# Patient Record
Sex: Female | Born: 1965 | Race: Black or African American | Hispanic: No | Marital: Single | Smoking: Former smoker
Health system: Southern US, Community
[De-identification: ages and names within clinical notes are randomized; demographics above are authoritative.]

## PROBLEM LIST (undated history)

## (undated) DIAGNOSIS — Z9289 Personal history of other medical treatment: Secondary | ICD-10-CM

## (undated) DIAGNOSIS — I82409 Acute embolism and thrombosis of unspecified deep veins of unspecified lower extremity: Secondary | ICD-10-CM

## (undated) DIAGNOSIS — N920 Excessive and frequent menstruation with regular cycle: Secondary | ICD-10-CM

## (undated) DIAGNOSIS — M1990 Unspecified osteoarthritis, unspecified site: Secondary | ICD-10-CM

## (undated) DIAGNOSIS — G4733 Obstructive sleep apnea (adult) (pediatric): Secondary | ICD-10-CM

## (undated) DIAGNOSIS — I509 Heart failure, unspecified: Secondary | ICD-10-CM

## (undated) DIAGNOSIS — I219 Acute myocardial infarction, unspecified: Secondary | ICD-10-CM

## (undated) DIAGNOSIS — I639 Cerebral infarction, unspecified: Secondary | ICD-10-CM

## (undated) DIAGNOSIS — D649 Anemia, unspecified: Secondary | ICD-10-CM

## (undated) HISTORY — PX: CARPAL TUNNEL RELEASE: SHX101

## (undated) HISTORY — DX: Obstructive sleep apnea (adult) (pediatric): G47.33

## (undated) HISTORY — PX: TONSILLECTOMY: SUR1361

## (undated) HISTORY — DX: Cerebral infarction, unspecified: I63.9

---

## 2001-04-24 DIAGNOSIS — G4733 Obstructive sleep apnea (adult) (pediatric): Secondary | ICD-10-CM | POA: Insufficient documentation

## 2001-04-24 HISTORY — DX: Obstructive sleep apnea (adult) (pediatric): G47.33

## 2002-04-24 HISTORY — PX: CHOLECYSTECTOMY: SHX55

## 2002-04-24 HISTORY — PX: GASTRIC BYPASS: SHX52

## 2010-03-09 ENCOUNTER — Emergency Department (HOSPITAL_BASED_OUTPATIENT_CLINIC_OR_DEPARTMENT_OTHER)
Admission: EM | Admit: 2010-03-09 | Discharge: 2010-03-09 | Payer: Self-pay | Source: Home / Self Care | Admitting: Emergency Medicine

## 2010-03-09 ENCOUNTER — Ambulatory Visit: Payer: Self-pay | Admitting: Diagnostic Radiology

## 2010-07-05 LAB — URINE MICROSCOPIC-ADD ON

## 2010-07-05 LAB — DIFFERENTIAL
Basophils Absolute: 0 10*3/uL (ref 0.0–0.1)
Eosinophils Relative: 0 % (ref 0–5)
Monocytes Absolute: 0.4 10*3/uL (ref 0.1–1.0)
Monocytes Relative: 6 % (ref 3–12)
Neutrophils Relative %: 84 % — ABNORMAL HIGH (ref 43–77)

## 2010-07-05 LAB — URINE CULTURE: Culture  Setup Time: 201111170044

## 2010-07-05 LAB — CBC
HCT: 19.5 % — ABNORMAL LOW (ref 36.0–46.0)
MCH: 18.4 pg — ABNORMAL LOW (ref 26.0–34.0)
MCHC: 30.4 g/dL (ref 30.0–36.0)
MCV: 60.5 fL — ABNORMAL LOW (ref 78.0–100.0)
RDW: 20.2 % — ABNORMAL HIGH (ref 11.5–15.5)

## 2010-07-05 LAB — URINALYSIS, ROUTINE W REFLEX MICROSCOPIC
Glucose, UA: NEGATIVE mg/dL
Nitrite: POSITIVE — AB
Protein, ur: NEGATIVE mg/dL
Urobilinogen, UA: 4 mg/dL — ABNORMAL HIGH (ref 0.0–1.0)

## 2010-07-05 LAB — COMPREHENSIVE METABOLIC PANEL
Alkaline Phosphatase: 77 U/L (ref 39–117)
BUN: 6 mg/dL (ref 6–23)
CO2: 24 mEq/L (ref 19–32)
Calcium: 8.4 mg/dL (ref 8.4–10.5)
GFR calc non Af Amer: >60 mL/min (ref >60)
Glucose, Bld: 81 mg/dL (ref 70–99)
Total Protein: 7.3 g/dL (ref 6.0–8.3)

## 2010-07-05 LAB — HEMOCCULT GUIAC POC 1CARD (OFFICE): Fecal Occult Bld: NEGATIVE

## 2010-07-05 LAB — PREGNANCY, URINE: Preg Test, Ur: NEGATIVE

## 2010-07-05 LAB — LIPASE, BLOOD: Lipase: 50 U/L (ref 23–300)

## 2015-07-30 ENCOUNTER — Encounter (HOSPITAL_COMMUNITY): Payer: Self-pay | Admitting: *Deleted

## 2015-07-30 ENCOUNTER — Emergency Department (HOSPITAL_COMMUNITY): Payer: PRIVATE HEALTH INSURANCE

## 2015-07-30 ENCOUNTER — Inpatient Hospital Stay (HOSPITAL_COMMUNITY)
Admission: EM | Admit: 2015-07-30 | Discharge: 2015-08-01 | DRG: 812 | Disposition: A | Discharge status: Home / Self Care | Payer: PRIVATE HEALTH INSURANCE | Attending: Internal Medicine | Admitting: Internal Medicine

## 2015-07-30 DIAGNOSIS — Z6841 Body Mass Index (BMI) 40.0 and over, adult: Secondary | ICD-10-CM

## 2015-07-30 DIAGNOSIS — I509 Heart failure, unspecified: Secondary | ICD-10-CM

## 2015-07-30 DIAGNOSIS — E538 Deficiency of other specified B group vitamins: Secondary | ICD-10-CM | POA: Diagnosis present

## 2015-07-30 DIAGNOSIS — Z86718 Personal history of other venous thrombosis and embolism: Secondary | ICD-10-CM

## 2015-07-30 DIAGNOSIS — D500 Iron deficiency anemia secondary to blood loss (chronic): Secondary | ICD-10-CM | POA: Diagnosis not present

## 2015-07-30 DIAGNOSIS — R0902 Hypoxemia: Secondary | ICD-10-CM | POA: Diagnosis not present

## 2015-07-30 DIAGNOSIS — R0600 Dyspnea, unspecified: Secondary | ICD-10-CM | POA: Diagnosis present

## 2015-07-30 DIAGNOSIS — I5033 Acute on chronic diastolic (congestive) heart failure: Secondary | ICD-10-CM | POA: Diagnosis present

## 2015-07-30 DIAGNOSIS — R0602 Shortness of breath: Secondary | ICD-10-CM | POA: Diagnosis not present

## 2015-07-30 DIAGNOSIS — R600 Localized edema: Secondary | ICD-10-CM

## 2015-07-30 DIAGNOSIS — N920 Excessive and frequent menstruation with regular cycle: Secondary | ICD-10-CM

## 2015-07-30 DIAGNOSIS — I5032 Chronic diastolic (congestive) heart failure: Secondary | ICD-10-CM | POA: Diagnosis present

## 2015-07-30 DIAGNOSIS — Z9884 Bariatric surgery status: Secondary | ICD-10-CM

## 2015-07-30 HISTORY — DX: Excessive and frequent menstruation with regular cycle: N92.0

## 2015-07-30 HISTORY — DX: Acute myocardial infarction, unspecified: I21.9

## 2015-07-30 HISTORY — DX: Anemia, unspecified: D64.9

## 2015-07-30 HISTORY — DX: Acute embolism and thrombosis of unspecified deep veins of unspecified lower extremity: I82.409

## 2015-07-30 HISTORY — DX: Personal history of other medical treatment: Z92.89

## 2015-07-30 HISTORY — DX: Unspecified osteoarthritis, unspecified site: M19.90

## 2015-07-30 HISTORY — DX: Heart failure, unspecified: I50.9

## 2015-07-30 LAB — BASIC METABOLIC PANEL
ANION GAP: 8 (ref 5–15)
BUN: 10 mg/dL (ref 6–20)
CO2: 26 mmol/L (ref 22–32)
Calcium: 8.8 mg/dL — ABNORMAL LOW (ref 8.9–10.3)
Chloride: 106 mmol/L (ref 101–111)
Creatinine, Ser: 0.58 mg/dL (ref 0.44–1.00)
GFR calc Af Amer: >60 mL/min (ref >60)
GLUCOSE: 110 mg/dL — AB (ref 65–99)
POTASSIUM: 4.6 mmol/L (ref 3.5–5.1)
Sodium: 140 mmol/L (ref 135–145)

## 2015-07-30 LAB — CBC
HEMATOCRIT: 28 % — AB (ref 36.0–46.0)
HEMOGLOBIN: 7.5 g/dL — AB (ref 12.0–15.0)
MCH: 19.7 pg — AB (ref 26.0–34.0)
MCHC: 26.8 g/dL — AB (ref 30.0–36.0)
MCV: 73.7 fL — AB (ref 78.0–100.0)
Platelets: 210 10*3/uL (ref 150–400)
RBC: 3.8 MIL/uL — ABNORMAL LOW (ref 3.87–5.11)
RDW: 18.1 % — AB (ref 11.5–15.5)
WBC: 4.7 10*3/uL (ref 4.0–10.5)

## 2015-07-30 LAB — BRAIN NATRIURETIC PEPTIDE: B Natriuretic Peptide: 43.8 pg/mL (ref 0.0–100.0)

## 2015-07-30 LAB — I-STAT TROPONIN, ED: Troponin i, poc: 0 ng/mL (ref 0.00–0.08)

## 2015-07-30 MED ORDER — FUROSEMIDE 10 MG/ML IJ SOLN
40.0000 mg | Freq: Once | INTRAMUSCULAR | Status: AC
Start: 2015-07-30 — End: 2015-07-31
  Administered 2015-07-31: 40 mg via INTRAVENOUS
  Filled 2015-07-30: qty 4

## 2015-07-30 NOTE — ED Notes (Signed)
Pt was placed on O2 [place]

## 2015-07-30 NOTE — ED Notes (Signed)
Pt states chest pain and sob x 1 week.  Noted bil LE swelling x 2 days.

## 2015-07-30 NOTE — ED Notes (Signed)
Pt ambulated to restroom with [facility] EMT, once pt returned O2 sats 79-84% placed on 2L [place], O2 100

## 2015-07-30 NOTE — H&P (Signed)
PCP:  No primary care provider on file.    Referring provider Erskine Emery Resident   Chief Complaint: Chest pain shortness of breath  HPI: Dana Abbott is a 50 y.o. female   has a past medical history of CHF (congestive heart failure) (HCC) and Anemia. DVT  Presented with one-week history of chest pain and shortness of breath associated with orthopnea and dyspnea on exertion. Have been having lower extremity edema for the past 2 days. She adheres to strict diet but still has significant swelling. Her clothes does not fit. Her chest pain started over 1 week ago hurting on the right worse with deep breaths.  She denies any melena or bright red blood per rectum, she endorses heavy vaginal bleeding twice a month She was supposed to have partial hysterectomy but her Ob GYN has retired. She has been taking iron pills  But not on the regular basis.   IN ER: Noted to be hypoxic down to 88% and was started on 2 L of nasal cannula BNP appears to be normal 43 chest x-ray was read as no cardiopulmonary edema although per my evaluation possibly some vascular congestion present. Difficult to evaluate study given habitus. Was noted to have a hemoglobin down to 7.5 MCV 73.7 troponin 0.0   Regarding pertinent past history: Patient states that in the past she used to be diagnosis of diastolic heart failure Echogram from August 2016 showing mild concentric left ventricular hypertrophy normal EF 60-65 percent. At that time she has undergone nuclear medicine myocardial perfusion scan that showed no reversible ischemia or infarction normal left ventricular wall motion ejection fraction is also 60% this studies were done [facility] healthcare Patient states that from her heart failure standpoint she's been doing so well that she was taken off all her medications. Regarding patient's history of anemia and August 2016 hemoglobin was down to 8.3 she has required transfusions in the past. As well as intervenous iron.    Hospitalist was called for admission for diastolic heart failure exacerbation  Review of Systems:    Pertinent positives include:  chest pain, Orthopnea, PND, anasarca,  Bilateral lower extremity s  Constitutional:  No weight loss, night sweats, Fevers, chills, fatigue, weight loss  HEENT:  No headaches, Difficulty swallowing,Tooth/dental problems,Sore throat,  No sneezing, itching, ear ache, nasal congestion, post nasal drip,  Cardio-vascular:  No  dizziness, palpitations.nowelling  GI:  No heartburn, indigestion, abdominal pain, nausea, vomiting, diarrhea, change in bowel habits, loss of appetite, melena, blood in stool, hematemesis Resp:  no shortness of breath at rest. No dyspnea on exertion, No excess mucus, no productive cough, No non-productive cough, No coughing up of blood.No change in color of mucus.No wheezing. Skin:  no rash or lesions. No jaundice GU:  no dysuria, change in color of urine, no urgency or frequency. No straining to urinate.  No flank pain.  Musculoskeletal:  No joint pain or no joint swelling. No decreased range of motion. No back pain.  Psych:  No change in mood or affect. No depression or anxiety. No memory loss.  Neuro: no localizing neurological complaints, no tingling, no weakness, no double vision, no gait abnormality, no slurred speech, no confusion  Otherwise ROS are negative except for above, 10 systems were reviewed  Past Medical History: Past Medical History  Diagnosis Date  . CHF (congestive heart failure) (HCC)   . Anemia    Past Surgical History  Procedure Laterality Date  . Cesarean section    . Gastric bypass  Medications: Prior to Admission medications   Medication Sig Start Date End Date Taking? Authorizing Provider  acetaminophen (TYLENOL) 500 MG tablet Take 500 mg by mouth every 6 (six) hours as needed for mild pain.   Yes Historical Provider, MD    Allergies:  No Known Allergies  Social History:  Ambulatory    independently   Lives at home  With family     reports that she has never smoked. She does not have any smokeless tobacco history on file. She reports that she does not drink alcohol or use illicit drugs.     Family History: family history includes COPD in her father; Diabetes type II in her daughter; Hypertension in her mother; Stroke in her mother. There is no history of Cancer.    Physical Exam: Patient Vitals for the past 24 hrs:  BP Temp Temp src Pulse Resp SpO2 Height Weight  07/30/15 2300 121/74 mmHg - - 74 22 99 % - -  07/30/15 2230 120/80 mmHg - - 78 23 99 % - -  07/30/15 2200 141/85 mmHg - - 77 25 100 % - -  07/30/15 2130 135/82 mmHg - - 72 19 100 % - -  07/30/15 2114 - - - - - 100 % - -  07/30/15 2112 127/74 mmHg - - 97 18 (!) 88 % - -  07/30/15 2000 107/68 mmHg - - 80 22 100 % - -  07/30/15 1930 107/78 mmHg - - 102 21 99 % - -  07/30/15 1915 - - - - - -  (1.448 m) (!) 166.924 kg (368 lb)  07/30/15 1906 127/75 mmHg 98.4 F (36.9 C) Oral 100 18 97 % - -    1. General:  in No Acute distress 2. Psychological: Alert and  Oriented 3. Head/ENT:   Moist  Mucous Membranes                          Head Non traumatic, neck supple                          Normal   Dentition 4. SKIN: normal  Skin turgor,  Skin clean Dry and intact no rash 5. Heart: Regular rate and rhythm no Murmur, Rub or gallop 6. Lungs:  no wheezes Some mild crackles  distant 7. Abdomen: Soft, non-tender, Non distended obese 8. Lower extremities: no clubbing, cyanosis, trace edema, obese 9. Neurologically Grossly intact, moving all 4 extremities equally 10. MSK: Normal range of motion  body mass index is 79.61 kg/(m^2).   Labs on Admission:   Results for orders placed or performed during the hospital encounter of 07/30/15 (from the past 24 hour(s))  Basic metabolic panel     Status: Abnormal   Collection Time: 07/30/15  7:04 PM  Result Value Ref Range   Sodium 140 135 - 145 mmol/L   Potassium  4.6 3.5 - 5.1 mmol/L   Chloride 106 101 - 111 mmol/L   CO2 26 22 - 32 mmol/L   Glucose, Bld 110 (H) 65 - 99 mg/dL   BUN 10 6 - 20 mg/dL   Creatinine, Ser 4.09 0.44 - 1.00 mg/dL   Calcium 8.8 (L) 8.9 - 10.3 mg/dL   GFR calc non Af Amer >60 >60 mL/min   GFR calc Af Amer >60 >60 mL/min   Anion gap 8 5 - 15  CBC     Status: Abnormal  Collection Time: 07/30/15  7:04 PM  Result Value Ref Range   WBC 4.7 4.0 - 10.5 K/uL   RBC 3.80 (L) 3.87 - 5.11 MIL/uL   Hemoglobin 7.5 (L) 12.0 - 15.0 g/dL   HCT 82.6 (L) 41.5 - 83.0 %   MCV 73.7 (L) 78.0 - 100.0 fL   MCH 19.7 (L) 26.0 - 34.0 pg   MCHC 26.8 (L) 30.0 - 36.0 g/dL   RDW 94.0 (H) 76.8 - 08.8 %   Platelets 210 150 - 400 K/uL  Brain natriuretic peptide     Status: None   Collection Time: 07/30/15  7:04 PM  Result Value Ref Range   B Natriuretic Peptide 43.8 0.0 - 100.0 pg/mL  I-stat troponin, ED     Status: None   Collection Time: 07/30/15  7:19 PM  Result Value Ref Range   Troponin i, poc 0.00 0.00 - 0.08 ng/mL   Comment 3            UA Not obtained  No results found for: HGBA1C  Estimated Creatinine Clearance: 123.4 mL/min (by C-G formula based on Cr of 0.58).  BNP (last 3 results) No results for input(s): PROBNP in the last 8760 hours.  Other results:  I have pearsonaly reviewed this: ECG REPORT  Rate: 102  Rhythm: Sinus tachycardia ST&T Change: T wave inversions in multiple leads QTC 466  Filed Weights   07/30/15 1915  Weight: 166.924 kg (368 lb)     Cultures:    Component Value Date/Time   SDES URINE, RANDOM 03/09/2010 1408   SPECREQUEST NONE 03/09/2010 1408   CULT ESCHERICHIA COLI 03/09/2010 1408   REPTSTATUS 03/11/2010 FINAL 03/09/2010 1408     Radiological Exams on Admission: Dg Chest 2 View  07/30/2015  CLINICAL DATA:  Pt c/o right sided chest tightness increasing with movement, SOB and dry cough x a couple of weeks and worsening. Pt currently on 2 liters of oxygen. Hx CHF. EXAM: CHEST  2 VIEW  COMPARISON:  12/07/2014 FINDINGS: Mild enlargement of the cardiopericardial silhouette. No mediastinal or hilar masses or evidence of adenopathy. Lungs are clear.  No pleural effusion or pneumothorax. Bony thorax is intact. IMPRESSION: 1. No acute cardiopulmonary disease. 2. Stable cardiomegaly. Electronically Signed   By: Amie Portland M.D.   On: 07/30/2015 20:32    Chart has been reviewed  Family not at  Bedside    Assessment/Plan  50 year old female with history of diastolic heart failure presents with dyspnea and peripheral edema worrisome for possible diastolic heart failure exacerbation vs symptomatic anemia due to dysfunctional uterine bleeding.   Present on Admission:  . Acute on chronic diastolic heart failure (HCC) -   BNP  Wnl,  no evidence of pulmonary edema but patient endorses, increased fluid . Will obtain echo,  Cycle cardiac markers if no evidence of PE would diurese.  Marland Kitchen Hypoxia - given prior hx of DVT will obtain CT angio, given CXR not consistent with significant fluid overload to explain hypoxia and evidence of chest pain.  . Blood loss anemia - most likely due to heavy menses. Will transfuse 1 unit given that patient is very symptomatic and likely hemodiluted. Obtain pelvic US. She will need  To establish follow up with OBgYN . Dyspnea - multifactorial will obtain CT angio to eval for PE, cycle CE, obtain echo, if no evince of PE would diurese.   obtain doppler of LE given leg swelling and prior hx of DVT.   Prophylaxis: SCD  CODE STATUS:  FULL CODE as per patient   Disposition:  To home once workup is complete and patient is stable  Other plan as per orders.  I have spent a total of 56 min on this admission    Dana Abbott 07/31/2015, 12:13 AM    [facility]  Pager [phone]   after 2 AM please page floor coverage PA If 7AM-7PM, please contact the day team taking care of the patient  [email or web address]  Password [email or web address]

## 2015-07-30 NOTE — ED Provider Notes (Signed)
CSN: [id number]     Arrival date & time 07/30/15  1856 History   First MD Initiated Contact with Patient 07/30/15 1929     Chief Complaint  Patient presents with  . Chest Pain  . Shortness of Breath     (Consider location/radiation/quality/duration/timing/severity/associated sxs/prior Treatment) HPI  This patient is a 50 year old female who is a history of nonischemic cardiomyopathy, was diagnosed with diastolic heart failure in the past, but has not been on any therapy recently. She presents today complaining of shortness of breath for the past several weeks. She says that she has been gaining weight, and her legs have been swelling, to the point she has been unable to put on her socks, and her pants are fitting tightly. She says that she has been having worsening dyspnea on exertion, and orthopnea now requiring her to sleep propped up on several pillows. She has never had symptoms like this in the past. Eyes any productive cough, she has had no hemoptysis. She is not on exogenous estrogen, and is a nonsmoker.  She has been reporting intermittent chest pain that she describes as heaviness.  She says it occurs with exertion, last minutes to hours, and resolved spontaneously. Nothing seems to make it better or worse. She has had no fevers or chills.   Past Medical History  Diagnosis Date  . CHF (congestive heart failure) (HCC)   . Anemia    Past Surgical History  Procedure Laterality Date  . Cesarean section    . Gastric bypass     History reviewed. No pertinent family history. Social History  Substance Use Topics  . Smoking status: Never Smoker   . Smokeless tobacco: None  . Alcohol Use: No   OB History    No data available     Review of Systems  Constitutional: Positive for fatigue. Negative for fever.  Respiratory: Positive for cough and shortness of breath.   Cardiovascular: Positive for chest pain, palpitations and leg swelling.  Gastrointestinal: Positive for abdominal  distention. Negative for nausea, abdominal pain, diarrhea, blood in stool and anal bleeding.  Genitourinary: Negative for dysuria, flank pain and enuresis.  Musculoskeletal: Negative for back pain and gait problem.  All other systems reviewed and are negative.     Allergies  Review of patient's allergies indicates no known allergies.  Home Medications   Prior to Admission medications   Medication Sig Start Date End Date Taking? Authorizing Provider  acetaminophen (TYLENOL) 500 MG tablet Take 500 mg by mouth every 6 (six) hours as needed for mild pain.   Yes Historical Provider, MD   BP 121/74 mmHg  Pulse 74  Temp(Src) 98.4 F (36.9 C) (Oral)  Resp 22  Ht 4\' 9"  (1.448 m)  Wt 166.924 kg  BMI 79.61 kg/m2  SpO2 99%  LMP 07/11/2015 (Approximate) Physical Exam  Constitutional: She is oriented to person, place, and time. She appears well-developed and well-nourished. No distress.  HENT:  Head: Normocephalic and atraumatic.  Eyes: Pupils are equal, round, and reactive to light.  Neck: JVD present.  Cardiovascular: Normal rate and regular rhythm.   No murmur heard. Pulmonary/Chest: Effort normal. No respiratory distress ( 3+ pitting edema to the knees bilaterally). She has rales.  Abdominal: Soft. Bowel sounds are normal. She exhibits no distension. There is no tenderness.  Musculoskeletal: Normal range of motion. She exhibits edema.  Neurological: She is alert and oriented to person, place, and time.  Skin: Skin is warm and dry.  Nursing note and vitals  reviewed.   ED Course  Procedures (including critical care time) Labs Review Labs Reviewed  BASIC METABOLIC PANEL - Abnormal; Notable for the following:    Glucose, Bld 110 (*)    Calcium 8.8 (*)    All other components within normal limits  CBC - Abnormal; Notable for the following:    RBC 3.80 (*)    Hemoglobin 7.5 (*)    HCT 28.0 (*)    MCV 73.7 (*)    MCH 19.7 (*)    MCHC 26.8 (*)    RDW 18.1 (*)    All other  components within normal limits  BRAIN NATRIURETIC PEPTIDE  VITAMIN B12  FOLATE  IRON AND TIBC  FERRITIN  RETICULOCYTES  I-STAT TROPOININ, ED    Imaging Review Dg Chest 2 View  07/30/2015  CLINICAL DATA:  Pt c/o right sided chest tightness increasing with movement, SOB and dry cough x a couple of weeks and worsening. Pt currently on 2 liters of oxygen. Hx CHF. EXAM: CHEST  2 VIEW COMPARISON:  12/07/2014 FINDINGS: Mild enlargement of the cardiopericardial silhouette. No mediastinal or hilar masses or evidence of adenopathy. Lungs are clear.  No pleural effusion or pneumothorax. Bony thorax is intact. IMPRESSION: 1. No acute cardiopulmonary disease. 2. Stable cardiomegaly. Electronically Signed   By: Amie Portland M.D.   On: 07/30/2015 20:32   I have personally reviewed and evaluated these images and lab results as part of my medical decision-making.   EKG Interpretation   Date/Time:  Friday July 30 2015 19:04:07 EDT Ventricular Rate:  102 PR Interval:  150 QRS Duration: 84 QT Interval:  358 QTC Calculation: 466 R Axis:   82 Text Interpretation:  Sinus tachycardia Nonspecific T wave abnormality  Abnormal ECG agree. no old comparison Confirmed by Donnald Garre, MD, Lebron Conners  [phone]) on 07/30/2015 7:11:30 PM      MDM   Final diagnoses:  None     The patient presents complaining of shortness of breath, chest pain, and leg swelling. She appears grossly volume overloaded on exam, and her history is consistent with heart failure. She has mild bibasilar pulmonary edema on  Chest x-ray, combined with JVD, and bilateral lower extremity edema. She has a history of heart failure, but has not been on therapy for quite some time. Labs obtained, notable for a normal BNP, but anemia of 7.5. She has a history of anemia in the past, and denies any symptoms of GI bleeding, though does have heavy vaginal leading associated with her periods, which may be the etiology. Do not believe she needs blood transfusion  at this time, but will send a type and screen and an anemia panel.  Will  Give Lasix, as she appears volume overloaded, also will check d-dimer at the request of the hospitalist, admit to hospitalist for diuresis and further workup.    Erskine Emery, MD 07/31/15 6045  Lavera Guise, MD 07/31/15 [phone]

## 2015-07-31 ENCOUNTER — Observation Stay (HOSPITAL_COMMUNITY): Payer: PRIVATE HEALTH INSURANCE

## 2015-07-31 ENCOUNTER — Encounter (HOSPITAL_COMMUNITY): Payer: Self-pay | Admitting: Internal Medicine

## 2015-07-31 DIAGNOSIS — Z9884 Bariatric surgery status: Secondary | ICD-10-CM | POA: Diagnosis not present

## 2015-07-31 DIAGNOSIS — I5032 Chronic diastolic (congestive) heart failure: Secondary | ICD-10-CM | POA: Diagnosis present

## 2015-07-31 DIAGNOSIS — E538 Deficiency of other specified B group vitamins: Secondary | ICD-10-CM | POA: Diagnosis present

## 2015-07-31 DIAGNOSIS — R0902 Hypoxemia: Secondary | ICD-10-CM | POA: Diagnosis present

## 2015-07-31 DIAGNOSIS — R0600 Dyspnea, unspecified: Secondary | ICD-10-CM

## 2015-07-31 DIAGNOSIS — Z6841 Body Mass Index (BMI) 40.0 and over, adult: Secondary | ICD-10-CM | POA: Diagnosis not present

## 2015-07-31 DIAGNOSIS — D500 Iron deficiency anemia secondary to blood loss (chronic): Secondary | ICD-10-CM | POA: Diagnosis present

## 2015-07-31 DIAGNOSIS — Z86718 Personal history of other venous thrombosis and embolism: Secondary | ICD-10-CM | POA: Diagnosis not present

## 2015-07-31 DIAGNOSIS — R0602 Shortness of breath: Secondary | ICD-10-CM | POA: Diagnosis present

## 2015-07-31 DIAGNOSIS — I5033 Acute on chronic diastolic (congestive) heart failure: Secondary | ICD-10-CM | POA: Diagnosis not present

## 2015-07-31 LAB — COMPREHENSIVE METABOLIC PANEL
ALT: 16 U/L (ref 14–54)
AST: 18 U/L (ref 15–41)
Albumin: 3 g/dL — ABNORMAL LOW (ref 3.5–5.0)
Alkaline Phosphatase: 57 U/L (ref 38–126)
Anion gap: 9 (ref 5–15)
BILIRUBIN TOTAL: 0.6 mg/dL (ref 0.3–1.2)
BUN: 11 mg/dL (ref 6–20)
CHLORIDE: 104 mmol/L (ref 101–111)
CO2: 26 mmol/L (ref 22–32)
CREATININE: 0.48 mg/dL (ref 0.44–1.00)
Calcium: 8.4 mg/dL — ABNORMAL LOW (ref 8.9–10.3)
GFR calc Af Amer: >60 mL/min (ref >60)
Glucose, Bld: 92 mg/dL (ref 65–99)
Potassium: 4.1 mmol/L (ref 3.5–5.1)
Sodium: 139 mmol/L (ref 135–145)
TOTAL PROTEIN: 6.5 g/dL (ref 6.5–8.1)

## 2015-07-31 LAB — RETICULOCYTES
RBC.: 3.76 MIL/uL — ABNORMAL LOW (ref 3.87–5.11)
RETIC CT PCT: 1.2 % (ref 0.4–3.1)
Retic Count, Absolute: 45.1 10*3/uL (ref 19.0–186.0)

## 2015-07-31 LAB — URINALYSIS, ROUTINE W REFLEX MICROSCOPIC
Bilirubin Urine: NEGATIVE
GLUCOSE, UA: NEGATIVE mg/dL
KETONES UR: NEGATIVE mg/dL
NITRITE: NEGATIVE
PROTEIN: NEGATIVE mg/dL
Specific Gravity, Urine: 1.007 (ref 1.005–1.030)
pH: 6 (ref 5.0–8.0)

## 2015-07-31 LAB — HEPATIC FUNCTION PANEL
ALBUMIN: 3.4 g/dL — AB (ref 3.5–5.0)
ALK PHOS: 65 U/L (ref 38–126)
ALT: 20 U/L (ref 14–54)
AST: 21 U/L (ref 15–41)
BILIRUBIN TOTAL: 0.7 mg/dL (ref 0.3–1.2)
Bilirubin, Direct: <0.1 mg/dL — ABNORMAL LOW (ref 0.1–0.5)
Total Protein: 7.4 g/dL (ref 6.5–8.1)

## 2015-07-31 LAB — PHOSPHORUS: Phosphorus: 3.5 mg/dL (ref 2.5–4.6)

## 2015-07-31 LAB — CBC
HCT: 24.2 % — ABNORMAL LOW (ref 36.0–46.0)
HCT: 30 % — ABNORMAL LOW (ref 36.0–46.0)
Hemoglobin: 6.4 g/dL — CL (ref 12.0–15.0)
Hemoglobin: 8.3 g/dL — ABNORMAL LOW (ref 12.0–15.0)
MCH: 19.3 pg — ABNORMAL LOW (ref 26.0–34.0)
MCH: 20.4 pg — ABNORMAL LOW (ref 26.0–34.0)
MCHC: 26.4 g/dL — ABNORMAL LOW (ref 30.0–36.0)
MCHC: 27.7 g/dL — ABNORMAL LOW (ref 30.0–36.0)
MCV: 72.9 fL — AB (ref 78.0–100.0)
MCV: 73.9 fL — ABNORMAL LOW (ref 78.0–100.0)
PLATELETS: 173 10*3/uL (ref 150–400)
PLATELETS: ADEQUATE 10*3/uL (ref 150–400)
RBC: 3.32 MIL/uL — ABNORMAL LOW (ref 3.87–5.11)
RBC: 4.06 MIL/uL (ref 3.87–5.11)
RDW: 18.2 % — AB (ref 11.5–15.5)
RDW: 17.6 % — AB (ref 11.5–15.5)
WBC: 3.8 10*3/uL — AB (ref 4.0–10.5)
WBC: 5.9 10*3/uL (ref 4.0–10.5)

## 2015-07-31 LAB — URINE MICROSCOPIC-ADD ON

## 2015-07-31 LAB — VITAMIN B12: Vitamin B-12: 173 pg/mL — ABNORMAL LOW (ref 180–914)

## 2015-07-31 LAB — PREPARE RBC (CROSSMATCH)

## 2015-07-31 LAB — BRAIN NATRIURETIC PEPTIDE: B Natriuretic Peptide: 39.2 pg/mL (ref 0.0–100.0)

## 2015-07-31 LAB — D-DIMER, QUANTITATIVE: D-Dimer, Quant: 0.75 ug/mL-FEU — ABNORMAL HIGH (ref 0.00–0.50)

## 2015-07-31 LAB — FERRITIN: Ferritin: 4 ng/mL — ABNORMAL LOW (ref 11–307)

## 2015-07-31 LAB — IRON AND TIBC
IRON: 15 ug/dL — AB (ref 28–170)
SATURATION RATIOS: 3 % — AB (ref 10.4–31.8)
TIBC: 560 ug/dL — AB (ref 250–450)
UIBC: 545 ug/dL

## 2015-07-31 LAB — MAGNESIUM: Magnesium: 1.7 mg/dL (ref 1.7–2.4)

## 2015-07-31 LAB — FOLATE: Folate: 11.3 ng/mL (ref >5.9)

## 2015-07-31 LAB — TROPONIN I
TROPONIN I: 0.04 ng/mL — AB (ref <0.031)
Troponin I: <0.03 ng/mL (ref <0.031)
Troponin I: <0.03 ng/mL (ref <0.031)

## 2015-07-31 LAB — ABO/RH: ABO/RH(D): AB POS

## 2015-07-31 LAB — PROTIME-INR
INR: 1.07 (ref 0.00–1.49)
PROTHROMBIN TIME: 14.1 s (ref 11.6–15.2)

## 2015-07-31 LAB — TSH: TSH: 1.989 u[IU]/mL (ref 0.350–4.500)

## 2015-07-31 MED ORDER — ACETAMINOPHEN 325 MG PO TABS
650.0000 mg | ORAL_TABLET | Freq: Once | ORAL | Status: AC
  Administered 2015-07-31: 650 mg via ORAL
  Filled 2015-07-31: qty 2

## 2015-07-31 MED ORDER — HYDROCODONE-ACETAMINOPHEN 5-325 MG PO TABS: 1.0000 | ORAL_TABLET | ORAL | Status: DC | PRN

## 2015-07-31 MED ORDER — ACETAMINOPHEN 500 MG PO TABS: 500.0000 mg | ORAL_TABLET | Freq: Four times a day (QID) | ORAL | Status: DC | PRN

## 2015-07-31 MED ORDER — FUROSEMIDE 10 MG/ML IJ SOLN
40.0000 mg | Freq: Every day | INTRAMUSCULAR | Status: DC
  Administered 2015-08-01: 40 mg via INTRAVENOUS
  Filled 2015-07-31: qty 4

## 2015-07-31 MED ORDER — ENOXAPARIN SODIUM 80 MG/0.8ML ~~LOC~~ SOLN
80.0000 mg | SUBCUTANEOUS | Status: DC
  Administered 2015-07-31: 80 mg via SUBCUTANEOUS
  Filled 2015-07-31 (×2): qty 0.8

## 2015-07-31 MED ORDER — SODIUM CHLORIDE 0.9% FLUSH
3.0000 mL | Freq: Two times a day (BID) | INTRAVENOUS | Status: DC
  Administered 2015-07-31 – 2015-08-01 (×4): 3 mL via INTRAVENOUS

## 2015-07-31 MED ORDER — ACETAMINOPHEN 650 MG RE SUPP: 650.0000 mg | Freq: Four times a day (QID) | RECTAL | Status: DC | PRN

## 2015-07-31 MED ORDER — DIPHENHYDRAMINE HCL 25 MG PO CAPS
25.0000 mg | ORAL_CAPSULE | Freq: Once | ORAL | Status: AC
  Administered 2015-07-31: 25 mg via ORAL
  Filled 2015-07-31: qty 1

## 2015-07-31 MED ORDER — CYANOCOBALAMIN 1000 MCG/ML IJ SOLN
1000.0000 ug | Freq: Every day | INTRAMUSCULAR | Status: DC
  Administered 2015-07-31 – 2015-08-01 (×2): 1000 ug via SUBCUTANEOUS
  Filled 2015-07-31 (×2): qty 1

## 2015-07-31 MED ORDER — ONDANSETRON HCL 4 MG PO TABS: 4.0000 mg | ORAL_TABLET | Freq: Four times a day (QID) | ORAL | Status: DC | PRN

## 2015-07-31 MED ORDER — SODIUM CHLORIDE 0.9 % IV SOLN
510.0000 mg | INTRAVENOUS | Status: DC
  Administered 2015-07-31: 510 mg via INTRAVENOUS
  Filled 2015-07-31: qty 17

## 2015-07-31 MED ORDER — SODIUM CHLORIDE 0.9 % IV SOLN
Freq: Once | INTRAVENOUS | Status: AC
  Administered 2015-07-31: 03:00:00 via INTRAVENOUS

## 2015-07-31 MED ORDER — IOPAMIDOL (ISOVUE-370) INJECTION 76%
INTRAVENOUS | Status: AC
  Administered 2015-07-31: 100 mL
  Filled 2015-07-31: qty 100

## 2015-07-31 MED ORDER — ACETAMINOPHEN 325 MG PO TABS: 650.0000 mg | ORAL_TABLET | Freq: Four times a day (QID) | ORAL | Status: DC | PRN

## 2015-07-31 MED ORDER — FUROSEMIDE 10 MG/ML IJ SOLN: 40.0000 mg | Freq: Two times a day (BID) | INTRAMUSCULAR | Status: DC

## 2015-07-31 MED ORDER — ONDANSETRON HCL 4 MG/2ML IJ SOLN: 4.0000 mg | Freq: Four times a day (QID) | INTRAMUSCULAR | Status: DC | PRN

## 2015-07-31 MED ORDER — FUROSEMIDE 10 MG/ML IJ SOLN
20.0000 mg | Freq: Once | INTRAMUSCULAR | Status: AC
  Administered 2015-07-31: 20 mg via INTRAVENOUS
  Filled 2015-07-31: qty 2

## 2015-07-31 MED ORDER — ASPIRIN EC 81 MG PO TBEC
81.0000 mg | DELAYED_RELEASE_TABLET | Freq: Every day | ORAL | Status: DC
  Administered 2015-07-31 – 2015-08-01 (×2): 81 mg via ORAL
  Filled 2015-07-31 (×2): qty 1

## 2015-07-31 MED ORDER — DIPHENHYDRAMINE HCL 50 MG/ML IJ SOLN
25.0000 mg | Freq: Once | INTRAMUSCULAR | Status: AC
  Administered 2015-07-31: 25 mg via INTRAVENOUS
  Filled 2015-07-31: qty 1

## 2015-07-31 MED ORDER — SODIUM CHLORIDE 0.9 % IV SOLN
Freq: Once | INTRAVENOUS | Status: AC
  Administered 2015-07-31: 08:00:00 via INTRAVENOUS

## 2015-07-31 NOTE — Progress Notes (Signed)
Pharmacy Consult Note - IV Iron  Pt is a 4 yoF with CHF, iron deficiency anemia and hx of DVT. She endorses heavy vaginal bleeding twice per month. She takes iron tablets at home, but not consistently. Pharmacy has been consulted to dose IV iron for her deficiency.  Hgb today is 6.4 (pt to receive transfusion).  Anemia panel shows: Iron 15, ferritin 4, normal folate  Plan: --Feraheme 510 mg IV today, then 510 mg IV in 3 days   Arcola Jansky, PharmD Clinical Pharmacy Resident Pager: [phone]

## 2015-07-31 NOTE — Progress Notes (Signed)
   07/31/15 0037  Vitals  Temp 98.2 F (36.8 C)  Temp Source Oral  BP 118/83 mmHg  BP Location Right Wrist  BP Method Automatic  Patient Position (if appropriate) Lying  Pulse Rate 81  Pulse Rate Source Dinamap  Resp 20  Oxygen Therapy  SpO2 92 %  O2 Device Room Air  Height and Weight  Height 4\' 9"  (1.448 m)  Weight (!) 167.74 kg (369 lb 12.8 oz)  Type of Scale Used Standing (Scale A)  Type of Weight Actual  BSA (Calculated - sq m) 2.6 sq meters  BMI (Calculated) 80.2  Weight in (lb) to have BMI = 25 115.3  Admitted pt to rm 3E06 from ED, pt alert and oriented, denied pain at this time, oriented to room, call bell placed within reach, placed on cardiac monitor, CCMD notified.

## 2015-07-31 NOTE — Progress Notes (Addendum)
PATIENT DETAILS Name: Dana Abbott Age: 50 y.o. Sex: female Date of Birth: [birth year] Admit Date: 07/30/2015 Admitting Physician Therisa Doyne, MD [facility]  Subjective: Lower extremity edema has improved.  Assessment/Plan: Active Problems: Anemia: Secondary to chronic blood loss from heavy menstrual bleeding. No evidence of GI loss at this time. Transfuse 2 units of PRBC, iron panel consistent with iron deficiency-start IV iron. Patient will require follow-up with outpatient GYN. Awaiting pelvic ultrasound/transvaginal ultrasound.  Exertional dyspnea: Likely secondary to above, I do not see any significant volume overload on my exam-patient claims that the lower extremity edema has significantly improved. She is significantly obese, reviewed recent hospitalization in August of last year to [facility]-she weighed approximately 349 pounds then. Current weight is around 369 pounds-we will then decrease Lasix to 40 mg IV daily, and await transthoracic echocardiogram. Note-CT angiographic chest negative for pulmonary embolism.  Vitamin B12 deficiency: Start supplementation, check IF Ab  Morbid obesity: Counseled regarding importance of weight loss  Disposition: Remain inpatient  Antimicrobial agents  See below  Anti-infectives    None      DVT Prophylaxis: Prophylactic Lovenox-not currently menstruating  Code Status: Full code   Family Communication None at bedside  Procedures: None  CONSULTS:  None  Time spent 30 minutes-Greater than 50% of this time was spent in counseling, explanation of diagnosis, planning of further management, and coordination of care.  MEDICATIONS: Scheduled Meds: . aspirin EC  81 mg Oral Daily  . enoxaparin (LOVENOX) injection  80 mg Subcutaneous Q24H  . ferumoxytol  510 mg Intravenous Q72H  . furosemide  40 mg Intravenous BID  . sodium chloride flush  3 mL Intravenous Q12H   Continuous  Infusions:  PRN Meds:.acetaminophen **OR** acetaminophen, HYDROcodone-acetaminophen, ondansetron **OR** ondansetron (ZOFRAN) IV    PHYSICAL EXAM: Vital signs in last 24 hours: Filed Vitals:   07/31/15 0902 07/31/15 0914 07/31/15 0917 07/31/15 1223  BP: 104/77 115/74 115/74 100/70  Pulse: 78  78 78  Temp: 98 F (36.7 C)  98 F (36.7 C) 98 F (36.7 C)  TempSrc: Oral  Oral Oral  Resp: Height:      Weight:      SpO2: 92% 92% 100% 100%    Weight change:  Filed Weights   07/30/15 1915 07/31/15 0037  Weight: 166.924 kg (368 lb) 167.74 kg (369 lb 12.8 oz)   Body mass index is 80 kg/(m^2).   Gen Exam: Awake and alert with clear speech.  She appears morbidly obese Neck: Supple, No JVD.   Chest: B/L Clear.   CVS: S1 S2 Regular, no murmurs.  Abdomen: soft, BS +, non tender, non distended.  Extremities: Trace edema, lower extremities warm to touch. Neurologic: Non Focal.   Skin: No Rash.   Wounds: N/A.   Intake/Output from previous day:  Intake/Output Summary (Last 24 hours) at 07/31/15 1237 Last data filed at 07/31/15 1223  Gross per 24 hour  Intake   1270 ml  Output   1600 ml  Net   -330 ml     LAB RESULTS: CBC  Recent Labs Lab 07/30/15 1904 07/31/15 0141  WBC 4.7 3.8*  HGB 7.5* 6.4*  HCT 28.0* 24.2*  PLT 210 173  MCV 73.7* 72.9*  MCH 19.7* 19.3*  MCHC 26.8* 26.4*  RDW 18.1* 18.2*    Chemistries   Recent Labs Lab 07/30/15 1904 07/31/15 0141  NA 140 139  K 4.6 4.1  CL 106 104  CO2 26 26  GLUCOSE 110* 92  BUN 10 11  CREATININE 0.58 0.48  CALCIUM 8.8* 8.4*  MG  --  1.7    CBG: No results for input(s): GLUCAP in the last 168 hours.  GFR Estimated Creatinine Clearance: 123.8 mL/min (by C-G formula based on Cr of 0.48).  Coagulation profile  Recent Labs Lab 07/31/15 0104  INR 1.07    Cardiac Enzymes  Recent Labs Lab 07/31/15 0104 07/31/15 0141  TROPONINI <0.03 <0.03    Invalid input(s): POCBNP  Recent Labs   07/31/15 0104  DDIMER 0.75*   No results for input(s): HGBA1C in the last 72 hours. No results for input(s): CHOL, HDL, LDLCALC, TRIG, CHOLHDL, LDLDIRECT in the last 72 hours.  Recent Labs  07/31/15 0138  TSH 1.989    Recent Labs  07/31/15 0104 07/31/15 0106  VITAMINB12 173*  --   FOLATE  --  11.3  FERRITIN 4*  --   TIBC 560*  --   IRON 15*  --   RETICCTPCT 1.2  --    No results for input(s): LIPASE, AMYLASE in the last 72 hours.  Urine Studies No results for input(s): UHGB, CRYS in the last 72 hours.  Invalid input(s): UACOL, UAPR, USPG, UPH, UTP, UGL, UKET, UBIL, UNIT, UROB, ULEU, UEPI, UWBC, URBC, UBAC, CAST, UCOM, BILUA  MICROBIOLOGY: No results found for this or any previous visit (from the past 240 hour(s)).  RADIOLOGY STUDIES/RESULTS: Dg Chest 2 View  07/30/2015  CLINICAL DATA:  Pt c/o right sided chest tightness increasing with movement, SOB and dry cough x a couple of weeks and worsening. Pt currently on 2 liters of oxygen. Hx CHF. EXAM: CHEST  2 VIEW COMPARISON:  12/07/2014 FINDINGS: Mild enlargement of the cardiopericardial silhouette. No mediastinal or hilar masses or evidence of adenopathy. Lungs are clear.  No pleural effusion or pneumothorax. Bony thorax is intact. IMPRESSION: 1. No acute cardiopulmonary disease. 2. Stable cardiomegaly. Electronically Signed   By: Amie Portland M.D.   On: 07/30/2015 20:32   Ct Angio Chest Pe W/cm &/or Wo Cm  07/31/2015  CLINICAL DATA:  Dyspnea. EXAM: CT ANGIOGRAPHY CHEST WITH CONTRAST TECHNIQUE: Multidetector CT imaging of the chest was performed using the standard protocol during bolus administration of intravenous contrast. Multiplanar CT image reconstructions and MIPs were obtained to evaluate the vascular anatomy. CONTRAST:  100 mL Isovue 370 intravenous COMPARISON:  01/09/2012 FINDINGS: Cardiovascular: There is good opacification of the pulmonary arteries. There is no pulmonary embolism. The thoracic aorta is normal in caliber  and intact. There is enlargement of the central pulmonary arteries, and this can be seen with pulmonary arterial hypertension. Moderate cardiomegaly. Lungs: Mild mosaic attenuation. This can be seen with air trapping. No consolidation. There is mild interlobular septal thickening, suggesting interstitial fluid. Central airways: Patent Effusions: None Lymphadenopathy: None Esophagus: Unremarkable Upper abdomen: Unremarkable Musculoskeletal: No significant abnormality. Review of the MIP images confirms the above findings. IMPRESSION: Negative for acute pulmonary embolism. There is cardiomegaly and mild interstitial fluid. Electronically Signed   By: Ellery Plunk M.D.   On: 07/31/2015 02:57    Jeoffrey Massed, MD  [facility] Pager:[phone]  If 7PM-7AM, please contact night-coverage [email or web address] Password [facility] 07/31/2015, 12:37 PM

## 2015-07-31 NOTE — Progress Notes (Signed)
Pt refused bed alarm, advised that for patient's safety we activate bed alarm on at night time but patient still refused her bed alarm to be on. Will continue to do hourly rounding.

## 2015-08-01 ENCOUNTER — Other Ambulatory Visit (HOSPITAL_COMMUNITY): Payer: PRIVATE HEALTH INSURANCE

## 2015-08-01 ENCOUNTER — Encounter (HOSPITAL_COMMUNITY): Payer: PRIVATE HEALTH INSURANCE

## 2015-08-01 DIAGNOSIS — I5033 Acute on chronic diastolic (congestive) heart failure: Secondary | ICD-10-CM

## 2015-08-01 LAB — BASIC METABOLIC PANEL
Anion gap: 10 (ref 5–15)
BUN: 8 mg/dL (ref 6–20)
CHLORIDE: 102 mmol/L (ref 101–111)
CO2: 28 mmol/L (ref 22–32)
Calcium: 8.3 mg/dL — ABNORMAL LOW (ref 8.9–10.3)
Creatinine, Ser: 0.49 mg/dL (ref 0.44–1.00)
GFR calc Af Amer: >60 mL/min (ref >60)
GFR calc non Af Amer: >60 mL/min (ref >60)
GLUCOSE: 99 mg/dL (ref 65–99)
POTASSIUM: 4.2 mmol/L (ref 3.5–5.1)
Sodium: 140 mmol/L (ref 135–145)

## 2015-08-01 LAB — CBC
HCT: 31.4 % — ABNORMAL LOW (ref 36.0–46.0)
HEMOGLOBIN: 8.9 g/dL — AB (ref 12.0–15.0)
MCH: 21.3 pg — AB (ref 26.0–34.0)
MCHC: 28.3 g/dL — ABNORMAL LOW (ref 30.0–36.0)
MCV: 75.3 fL — AB (ref 78.0–100.0)
PLATELETS: 164 10*3/uL (ref 150–400)
RBC: 4.17 MIL/uL (ref 3.87–5.11)
RDW: 18 % — ABNORMAL HIGH (ref 11.5–15.5)
WBC: 4.8 10*3/uL (ref 4.0–10.5)

## 2015-08-01 LAB — TYPE AND SCREEN
ABO/RH(D): AB POS
Antibody Screen: NEGATIVE
UNIT DIVISION: 0
UNIT DIVISION: 0

## 2015-08-01 MED ORDER — HYDROCHLOROTHIAZIDE 25 MG PO TABS: 25.0000 mg | ORAL_TABLET | Freq: Every day | ORAL | Status: DC

## 2015-08-01 MED ORDER — FERROUS SULFATE 325 (65 FE) MG PO TABS: 325.0000 mg | ORAL_TABLET | Freq: Three times a day (TID) | ORAL | Status: DC

## 2015-08-01 MED ORDER — POTASSIUM CHLORIDE ER 20 MEQ PO TBCR: 20.0000 meq | EXTENDED_RELEASE_TABLET | Freq: Every day | ORAL | Status: DC

## 2015-08-01 MED ORDER — DOCUSATE SODIUM 100 MG PO CAPS: 100.0000 mg | ORAL_CAPSULE | Freq: Two times a day (BID) | ORAL | Status: DC

## 2015-08-01 NOTE — Discharge Summary (Signed)
PATIENT DETAILS Name: Dana Abbott Age: 50 y.o. Sex: female Date of Birth: [birth year] MRN: [id number]. Admitting Physician: Therisa Doyne, MD [facility]  Admit Date: 07/30/2015 Discharge date: 08/01/2015  Recommendations for Outpatient Follow-up:  1. Follow Intrinsic Factor Antibody 2. Has Vitamin B12 def-needs Vit B12 injection daily for 1 week and then monthly 3. Needs referral to GYN for hysterectomy 4. Needs referral to bariatric surgery for consideration of weight loss 5. Please repeat CBC/BMET at next visit 6. New Medications:HCTZ, Ferrous Sulfate  PRIMARY DISCHARGE DIAGNOSIS:  Active Problems:   Acute on chronic diastolic heart failure (HCC)   Hypoxia   Blood loss anemia   Dyspnea      PAST MEDICAL HISTORY: Past Medical History  Diagnosis Date  . CHF (congestive heart failure) (HCC)   . Anemia   . DVT (deep venous thrombosis) (HCC) "after my heart attack"    "one of my legs"  . Heavy menses   . Myocardial infarction [facility]) 2003?  Marland Kitchen History of blood transfusion "I've had alot"    "I'm anemic"  . Arthritis     "knees" (07/31/2015)    DISCHARGE MEDICATIONS: Current Discharge Medication List    START taking these medications   Details  docusate sodium (COLACE) 100 MG capsule Take 1 capsule (100 mg total) by mouth 2 (two) times daily. Qty: 10 capsule, Refills: 0    ferrous sulfate 325 (65 FE) MG tablet Take 1 tablet (325 mg total) by mouth 3 (three) times daily with meals. Qty: 90 tablet, Refills: 0    hydrochlorothiazide (HYDRODIURIL) 25 MG tablet Take 1 tablet (25 mg total) by mouth daily. Qty: 30 tablet, Refills: 0    potassium chloride 20 MEQ TBCR Take 20 mEq by mouth daily. Qty: 30 tablet, Refills: 0      STOP taking these medications     acetaminophen (TYLENOL) 500 MG tablet         ALLERGIES:  No Known Allergies  BRIEF HPI:  See H&P, Labs, Consult and Test reports for all details in brief, patient was admitted for  evaluation of exertional dyspnea .  CONSULTATIONS:   None  PERTINENT RADIOLOGIC STUDIES: Dg Chest 2 View  07/30/2015  CLINICAL DATA:  Pt c/o right sided chest tightness increasing with movement, SOB and dry cough x a couple of weeks and worsening. Pt currently on 2 liters of oxygen. Hx CHF. EXAM: CHEST  2 VIEW COMPARISON:  12/07/2014 FINDINGS: Mild enlargement of the cardiopericardial silhouette. No mediastinal or hilar masses or evidence of adenopathy. Lungs are clear.  No pleural effusion or pneumothorax. Bony thorax is intact. IMPRESSION: 1. No acute cardiopulmonary disease. 2. Stable cardiomegaly. Electronically Signed   By: Amie Portland M.D.   On: 07/30/2015 20:32   Ct Angio Chest Pe W/cm &/or Wo Cm  07/31/2015  CLINICAL DATA:  Dyspnea. EXAM: CT ANGIOGRAPHY CHEST WITH CONTRAST TECHNIQUE: Multidetector CT imaging of the chest was performed using the standard protocol during bolus administration of intravenous contrast. Multiplanar CT image reconstructions and MIPs were obtained to evaluate the vascular anatomy. CONTRAST:  100 mL Isovue 370 intravenous COMPARISON:  01/09/2012 FINDINGS: Cardiovascular: There is good opacification of the pulmonary arteries. There is no pulmonary embolism. The thoracic aorta is normal in caliber and intact. There is enlargement of the central pulmonary arteries, and this can be seen with pulmonary arterial hypertension. Moderate cardiomegaly. Lungs: Mild mosaic attenuation. This can be seen with air trapping. No consolidation. There is mild interlobular septal thickening, suggesting interstitial  fluid. Central airways: Patent Effusions: None Lymphadenopathy: None Esophagus: Unremarkable Upper abdomen: Unremarkable Musculoskeletal: No significant abnormality. Review of the MIP images confirms the above findings. IMPRESSION: Negative for acute pulmonary embolism. There is cardiomegaly and mild interstitial fluid. Electronically Signed   By: Ellery Plunk M.D.   On:  07/31/2015 02:57   US Transvaginal Non-ob  07/31/2015  CLINICAL DATA:  Heavy menstrual periods EXAM: TRANSABDOMINAL AND TRANSVAGINAL ULTRASOUND OF PELVIS TECHNIQUE: Both transabdominal and transvaginal ultrasound examinations of the pelvis were performed. Transabdominal technique was performed for global imaging of the pelvis including uterus, ovaries, adnexal regions, and pelvic cul-de-sac. It was necessary to proceed with endovaginal exam following the transabdominal exam to visualize the ovaries. COMPARISON:  05/04/2014 FINDINGS: Uterus Measurements: 11.6 x 6.8 x 6.2 cm. Multiple uterine fibroids are seen. The largest of these anteriorly measures 2.4 cm in greatest dimension. The largest of these posteriorly measures 2.5 cm in greatest dimension. Endometrium Thickness: 14 mm.  No focal abnormality visualized. Right ovary Not visualized. Left ovary Not visualized. Other findings No abnormal free fluid. IMPRESSION: Multiple uterine fibroids No acute abnormality noted. Electronically Signed   By: Alcide Clever M.D.   On: 07/31/2015 16:36   US Pelvis Complete  07/31/2015  CLINICAL DATA:  Heavy menstrual periods EXAM: TRANSABDOMINAL AND TRANSVAGINAL ULTRASOUND OF PELVIS TECHNIQUE: Both transabdominal and transvaginal ultrasound examinations of the pelvis were performed. Transabdominal technique was performed for global imaging of the pelvis including uterus, ovaries, adnexal regions, and pelvic cul-de-sac. It was necessary to proceed with endovaginal exam following the transabdominal exam to visualize the ovaries. COMPARISON:  05/04/2014 FINDINGS: Uterus Measurements: 11.6 x 6.8 x 6.2 cm. Multiple uterine fibroids are seen. The largest of these anteriorly measures 2.4 cm in greatest dimension. The largest of these posteriorly measures 2.5 cm in greatest dimension. Endometrium Thickness: 14 mm.  No focal abnormality visualized. Right ovary Not visualized. Left ovary Not visualized. Other findings No abnormal free  fluid. IMPRESSION: Multiple uterine fibroids No acute abnormality noted. Electronically Signed   By: Alcide Clever M.D.   On: 07/31/2015 16:36     PERTINENT LAB RESULTS: CBC:  Recent Labs  07/31/15 1707 08/01/15 0334  WBC 5.9 4.8  HGB 8.3* 8.9*  HCT 30.0* 31.4*  PLT PLATELET CLUMPS NOTED ON SMEAR, COUNT APPEARS ADEQUATE 164   CMET CMP     Component Value Date/Time   NA 140 08/01/2015 0334   K 4.2 08/01/2015 0334   CL 102 08/01/2015 0334   CO2 28 08/01/2015 0334   GLUCOSE 99 08/01/2015 0334   BUN 8 08/01/2015 0334   CREATININE 0.49 08/01/2015 0334   CALCIUM 8.3* 08/01/2015 0334   PROT 6.5 07/31/2015 0141   ALBUMIN 3.0* 07/31/2015 0141   AST 18 07/31/2015 0141   ALT 16 07/31/2015 0141   ALKPHOS 57 07/31/2015 0141   BILITOT 0.6 07/31/2015 0141   GFRNONAA >60 08/01/2015 0334   GFRAA >60 08/01/2015 0334    GFR Estimated Creatinine Clearance: 123.4 mL/min (by C-G formula based on Cr of 0.49). No results for input(s): LIPASE, AMYLASE in the last 72 hours.  Recent Labs  07/31/15 0104 07/31/15 0141 07/31/15 1052  TROPONINI <0.03 <0.03 0.04*   Invalid input(s): POCBNP  Recent Labs  07/31/15 0104  DDIMER 0.75*   No results for input(s): HGBA1C in the last 72 hours. No results for input(s): CHOL, HDL, LDLCALC, TRIG, CHOLHDL, LDLDIRECT in the last 72 hours.  Recent Labs  07/31/15 0138  TSH 1.989    Recent  Labs  07/31/15 0104 07/31/15 0106  VITAMINB12 173*  --   FOLATE  --  11.3  FERRITIN 4*  --   TIBC 560*  --   IRON 15*  --   RETICCTPCT 1.2  --    Coags:  Recent Labs  07/31/15 0104  INR 1.07   Microbiology: No results found for this or any previous visit (from the past 240 hour(s)).   BRIEF HOSPITAL COURSE:  Anemia: Secondary to chronic blood loss from heavy menstrual bleeding. No evidence of GI loss at this time. Transfused 2 units of PRBC, iron panel consistent with iron deficiency-given IV iron.Hb stable at 8.9 on discharge.  Patient will  require follow-up with outpatient GYN and will require continued Fe supplementation as outpatient. Pelvic ultrasound/transvaginal ultrasound did show multiple uterine fibroids.  Exertional dyspnea: Likely secondary to above, I do not see any significant volume overload on my exam-patient claims that the lower extremity edema has significantly improved with IV Lasix. She is significantly obese, reviewed recent hospitalization in August of last year to [facility]-she weighed approximately 349 pounds then. Current weight is around 367 pounds (369 lbs on admit).Most recent Echo Aug 2016 [facility] showed preserved EF around 60-65 percent. Patient does not want to pursue another Echo while inpatient-suspect it can be done as outpatient. Note-CT angiographic chest negative for pulmonary embolism.   Vitamin B12 deficiency: Started supplementation while inpatient-will need continued supplementation as outpatient. IF Ab pending-please follow.   Morbid obesity: Counseled regarding importance of weight loss -needs referral to bariatric clinic.  TODAY-DAY OF DISCHARGE:  Subjective:   Asjia Berrios today has no headache,no chest abdominal pain,no new weakness tingling or numbness, feels much better wants to go home today.   Objective:   Blood pressure 121/67, pulse 82, temperature 98.7 F (37.1 C), temperature source Oral, resp. rate 18, height  (1.448 m), weight 166.878 kg (367 lb 14.4 oz), last menstrual period 07/11/2015, SpO2 97 %.  Intake/Output Summary (Last 24 hours) at 08/01/15 0959 Last data filed at 07/31/15 1955  Gross per 24 hour  Intake    695 ml  Output   1800 ml  Net  -1105 ml   Filed Weights   07/30/15 1915 07/31/15 0037 08/01/15 0626  Weight: 166.924 kg (368 lb) 167.74 kg (369 lb 12.8 oz) 166.878 kg (367 lb 14.4 oz)    Exam Awake Alert, Oriented *3, No new F.N deficits, Normal affect [place].AT,PERRAL Supple Neck,No JVD, No cervical lymphadenopathy  appriciated.  Symmetrical Chest wall movement, Good air movement bilaterally, CTAB RRR,No Gallops,Rubs or new Murmurs, No Parasternal Heave +ve B.Sounds, Abd Soft, Non tender, No organomegaly appriciated, No rebound -guarding or rigidity. No Cyanosis, Clubbing or edema, No new Rash or bruise  DISCHARGE CONDITION: Stable  DISPOSITION: Home  DISCHARGE INSTRUCTIONS:    Activity:  As tolerated   Get Medicines reviewed and adjusted: Please take all your medications with you for your next visit with your Primary MD  Please request your Primary MD to go over all hospital tests and procedure/radiological results at the follow up, please ask your Primary MD to get all Hospital records sent to his/her office.  If you experience worsening of your admission symptoms, develop shortness of breath, life threatening emergency, suicidal or homicidal thoughts you must seek medical attention immediately by calling 911 or calling your MD immediately  if symptoms less severe.  You must read complete instructions/literature along with all the possible adverse reactions/side effects for all  the Medicines you take and that have been prescribed to you. Take any new Medicines after you have completely understood and accpet all the possible adverse reactions/side effects.   Do not drive when taking Pain medications.   Do not take more than prescribed Pain, Sleep and Anxiety Medications  Special Instructions: If you have smoked or chewed Tobacco  in the last 2 yrs please stop smoking, stop any regular Alcohol  and or any Recreational drug use.  Wear Seat belts while driving.  Please note  You were cared for by a hospitalist during your hospital stay. Once you are discharged, your primary care physician will handle any further medical issues. Please note that NO REFILLS for any discharge medications will be authorized once you are discharged, as it is imperative that you return to your primary care physician  (or establish a relationship with a primary care physician if you do not have one) for your aftercare needs so that they can reassess your need for medications and monitor your lab values.   Diet recommendation: Heart Healthy diet  Discharge Instructions    Call MD for:  difficulty breathing, headache or visual disturbances    Complete by:  As directed      Call MD for:  persistant nausea and vomiting    Complete by:  As directed      Diet - low sodium heart healthy    Complete by:  As directed      Increase activity slowly    Complete by:  As directed            Follow-up Information    Follow up with [facility]. Schedule an appointment as soon as possible for a visit in 1 week.   Contact information:   [address] [place] [address] [phone]       Follow up with Nile Dear, NP. Schedule an appointment as soon as possible for a visit in 1 week.   Specialty:  Nurse Practitioner   Why:  Hospital follow up, Repeat Complete Blood Count, Repeat electrolytes   Contact information:   [address] [place] [address] [phone]      Total Time spent on discharge equals  45 minutes  Signed: Jeoffrey Massed 08/01/2015 9:59 AM

## 2015-08-01 NOTE — Discharge Summary (Signed)
Pt got discharged to home, discharge instructions provided and patient showed understanding to it, IV taken out,Telemonitor DC,pt left unit in wheelchair with all of the belongings accompanied with a family member (Daughter) 

## 2015-08-02 LAB — HEMOGLOBIN A1C
Hgb A1c MFr Bld: 6 % — ABNORMAL HIGH (ref 4.8–5.6)
Mean Plasma Glucose: 126 mg/dL

## 2015-08-02 LAB — INTRINSIC FACTOR ANTIBODIES: Intrinsic Factor: 15.1 AU/mL — ABNORMAL HIGH (ref 0.0–1.1)

## 2015-08-28 ENCOUNTER — Encounter (HOSPITAL_COMMUNITY): Payer: Self-pay

## 2015-08-28 ENCOUNTER — Emergency Department (HOSPITAL_COMMUNITY): Payer: PRIVATE HEALTH INSURANCE

## 2015-08-28 ENCOUNTER — Emergency Department (HOSPITAL_COMMUNITY)
Admission: EM | Admit: 2015-08-28 | Discharge: 2015-08-28 | Disposition: A | Discharge status: Home / Self Care | Payer: PRIVATE HEALTH INSURANCE | Attending: Emergency Medicine | Admitting: Emergency Medicine

## 2015-08-28 DIAGNOSIS — R2243 Localized swelling, mass and lump, lower limb, bilateral: Secondary | ICD-10-CM | POA: Diagnosis present

## 2015-08-28 DIAGNOSIS — Z8742 Personal history of other diseases of the female genital tract: Secondary | ICD-10-CM | POA: Insufficient documentation

## 2015-08-28 DIAGNOSIS — R600 Localized edema: Secondary | ICD-10-CM | POA: Diagnosis not present

## 2015-08-28 DIAGNOSIS — D649 Anemia, unspecified: Secondary | ICD-10-CM | POA: Diagnosis not present

## 2015-08-28 DIAGNOSIS — M1990 Unspecified osteoarthritis, unspecified site: Secondary | ICD-10-CM | POA: Insufficient documentation

## 2015-08-28 DIAGNOSIS — R609 Edema, unspecified: Secondary | ICD-10-CM

## 2015-08-28 DIAGNOSIS — Z86718 Personal history of other venous thrombosis and embolism: Secondary | ICD-10-CM | POA: Insufficient documentation

## 2015-08-28 DIAGNOSIS — M25561 Pain in right knee: Secondary | ICD-10-CM | POA: Insufficient documentation

## 2015-08-28 DIAGNOSIS — Z79899 Other long term (current) drug therapy: Secondary | ICD-10-CM | POA: Insufficient documentation

## 2015-08-28 DIAGNOSIS — I509 Heart failure, unspecified: Secondary | ICD-10-CM | POA: Insufficient documentation

## 2015-08-28 LAB — CBC
HEMATOCRIT: 35.2 % — AB (ref 36.0–46.0)
Hemoglobin: 9.6 g/dL — ABNORMAL LOW (ref 12.0–15.0)
MCH: 22.4 pg — AB (ref 26.0–34.0)
MCHC: 27.3 g/dL — ABNORMAL LOW (ref 30.0–36.0)
MCV: 82.1 fL (ref 78.0–100.0)
PLATELETS: 249 10*3/uL (ref 150–400)
RBC: 4.29 MIL/uL (ref 3.87–5.11)
RDW: 23.2 % — AB (ref 11.5–15.5)
WBC: 5 10*3/uL (ref 4.0–10.5)

## 2015-08-28 LAB — BASIC METABOLIC PANEL
Anion gap: 11 (ref 5–15)
CHLORIDE: 98 mmol/L — AB (ref 101–111)
CO2: 30 mmol/L (ref 22–32)
CREATININE: 0.48 mg/dL (ref 0.44–1.00)
Calcium: 8.4 mg/dL — ABNORMAL LOW (ref 8.9–10.3)
GFR calc Af Amer: >60 mL/min (ref >60)
GFR calc non Af Amer: >60 mL/min (ref >60)
GLUCOSE: 101 mg/dL — AB (ref 65–99)
POTASSIUM: 3.5 mmol/L (ref 3.5–5.1)
Sodium: 139 mmol/L (ref 135–145)

## 2015-08-28 LAB — BRAIN NATRIURETIC PEPTIDE: B Natriuretic Peptide: 63.3 pg/mL (ref 0.0–100.0)

## 2015-08-28 MED ORDER — OXYCODONE-ACETAMINOPHEN 5-325 MG PO TABS
1.0000 | ORAL_TABLET | Freq: Once | ORAL | Status: AC
  Administered 2015-08-28: 1 via ORAL
  Filled 2015-08-28: qty 1

## 2015-08-28 MED ORDER — POTASSIUM CHLORIDE ER 20 MEQ PO TBCR: 20.0000 meq | EXTENDED_RELEASE_TABLET | Freq: Every day | ORAL | Status: DC

## 2015-08-28 MED ORDER — HYDROCODONE-ACETAMINOPHEN 5-325 MG PO TABS: 1.0000 | ORAL_TABLET | Freq: Four times a day (QID) | ORAL | Status: DC | PRN

## 2015-08-28 MED ORDER — FUROSEMIDE 20 MG PO TABS: 20.0000 mg | ORAL_TABLET | Freq: Every day | ORAL | Status: DC

## 2015-08-28 NOTE — ED Notes (Addendum)
Patient here with 2 weeks of increased lower extremity swelling since blood transfusion. Reports seen at [facility] and no diagnosis. No shortness of breath but reports painful to walk. States she is normal ambulatory but having to use walker now for ambulation. Denies trauma

## 2015-08-28 NOTE — ED Provider Notes (Signed)
CSN: [id number]     Arrival date & time 08/28/15  1506 History   First MD Initiated Contact with Patient 08/28/15 1651     Chief Complaint  Patient presents with  . bilateral leg swelling     HPI Patient presents with swelling in both of her legs. She states that she has had it for the last month since she received a blood transfusion for anemia due to heavy menses and apparently pernicous anemia. States she got seen at [facility] and was told that she had arthritis in her right knee. States she's had arthritis in her knee since she was 19 and this feels different. States she's had to walk with a walker because the pain is so severe. No Chest pain. No trouble breathing. No fevers. No trauma. Patient is morbidly obese. Patient also has some burning in the feet. States she can no longer wear shoes consider so swollen. Past Medical History  Diagnosis Date  . CHF (congestive heart failure) (HCC)   . Anemia   . DVT (deep venous thrombosis) (HCC) "after my heart attack"    "one of my legs"  . Heavy menses   . Myocardial infarction [facility]) 2003?  Marland Kitchen History of blood transfusion "I've had alot"    "I'm anemic"  . Arthritis     "knees" (07/31/2015)   Past Surgical History  Procedure Laterality Date  . Cesarean section  1985; 1991; 1993  . Gastric bypass  2004  . Tonsillectomy    . Carpal tunnel release Right   . Cholecystectomy  2004    w/gastric OR   Family History  Problem Relation Age of Onset  . Stroke Mother   . Hypertension Mother   . COPD Father   . Cancer Neg Hx   . Diabetes type II Daughter    Social History  Substance Use Topics  . Smoking status: Never Smoker   . Smokeless tobacco: Never Used  . Alcohol Use: Yes     Comment: 07/31/2015 "used to drink; nothing since ~ 2009"   OB History    No data available     Review of Systems  Constitutional: Negative for activity change, appetite change and fatigue.  Eyes: Negative for pain.  Respiratory: Negative for chest  tightness and shortness of breath.   Cardiovascular: Positive for leg swelling. Negative for chest pain.  Gastrointestinal: Negative for nausea, vomiting, abdominal pain and diarrhea.  Genitourinary: Negative for flank pain.  Musculoskeletal: Negative for back pain and neck stiffness.  Skin: Negative for rash.  Neurological: Negative for speech difficulty, weakness, numbness and headaches.  Psychiatric/Behavioral: Negative for behavioral problems.      Allergies  Shellfish allergy  Home Medications   Prior to Admission medications   Medication Sig Start Date End Date Taking? Authorizing Provider  acetaminophen (TYLENOL) 500 MG tablet Take 500 mg by mouth every 6 (six) hours as needed for mild pain.   Yes Historical Provider, MD  ferrous sulfate 325 (65 FE) MG tablet Take 1 tablet (325 mg total) by mouth 3 (three) times daily with meals. 08/01/15  Yes Shanker Levora Dredge, MD  hydrochlorothiazide (HYDRODIURIL) 25 MG tablet Take 1 tablet (25 mg total) by mouth daily. 08/01/15  Yes Shanker Levora Dredge, MD  docusate sodium (COLACE) 100 MG capsule Take 1 capsule (100 mg total) by mouth 2 (two) times daily. Patient not taking: Reported on 08/28/2015 08/01/15   Maretta Bees, MD  furosemide (LASIX) 20 MG tablet Take 1 tablet (20  mg total) by mouth daily. 08/28/15   Benjiman Core, MD  HYDROcodone-acetaminophen (NORCO/VICODIN) 5-325 MG tablet Take 1-2 tablets by mouth every 6 (six) hours as needed. 08/28/15   Benjiman Core, MD  Potassium Chloride ER 20 MEQ TBCR Take 20 mEq by mouth daily. 08/28/15   Benjiman Core, MD   BP 117/72 mmHg  Pulse 87  Temp(Src) 98.5 F (36.9 C) (Oral)  Resp 18  Wt 367 lb (166.47 kg)  SpO2 93%  LMP 07/11/2015 (Approximate) Physical Exam  Constitutional: She appears well-developed.  Patient is morbidly obese  HENT:  Head: Atraumatic.  Neck: Neck supple.  Cardiovascular: Normal rate.   Pulmonary/Chest: Effort normal.  Abdominal: There is no tenderness.   Musculoskeletal:  Patient is morbidly obese with large upper legs to below the knee. There is tenderness to the right knee area medially. Mild to moderate edema to bilateral feet. Feet are warm. No signs of infection.  Neurological: She is alert.  Skin: Skin is warm. No erythema.    ED Course  Procedures (including critical care time) Labs Review Labs Reviewed  BASIC METABOLIC PANEL - Abnormal; Notable for the following:    Chloride 98 (*)    Glucose, Bld 101 (*)    BUN <5 (*)    Calcium 8.4 (*)    All other components within normal limits  CBC - Abnormal; Notable for the following:    Hemoglobin 9.6 (*)    HCT 35.2 (*)    MCH 22.4 (*)    MCHC 27.3 (*)    RDW 23.2 (*)    All other components within normal limits  BRAIN NATRIURETIC PEPTIDE    Imaging Review Dg Knee Complete 4 Views Right  08/28/2015  CLINICAL DATA:  50 year old female with chronic right knee pain. EXAM: RIGHT KNEE - COMPLETE 4+ VIEW COMPARISON:  08/13/2015 FINDINGS: There is no evidence of acute fracture or dislocation. Severe degenerative changes in the medial compartment noted and mild to moderate degenerative changes are noted in the lateral and patellofemoral compartments. No suspicious focal bony lesions are identified. Body habitus limits evaluation for effusion. IMPRESSION: No evidence of acute bony abnormality. Tricompartmental degenerative changes, severe in the medial compartment. Electronically Signed   By: Harmon Pier M.D.   On: 08/28/2015 18:49   I have personally reviewed and evaluated these images and lab results as part of my medical decision-making.   EKG Interpretation None      MDM   Final diagnoses:  Peripheral edema  Right knee pain    Is here with her legs particularly right knee. Also some swelling in her feet. She is morbidly obese. BNP is reassuring. Will increase patient's HCTZ to Lasix. Right knee pain and x-rays show severe degenerative disease. Likely bone-on-bone. She is too  obese for a knee immobilizer. Will have follow-up with orthopedic surgery. There is no case manager here this weekend and unable to arrange more specific follow-up For her general medical concerns.   Benjiman Core, MD 08/28/15 [phone]

## 2015-08-28 NOTE — Discharge Instructions (Signed)
Edema °Edema is an abnormal buildup of fluids in your body tissues. Edema is somewhat dependent on gravity to pull the fluid to the lowest place in your body. That makes the condition more common in the legs and thighs (lower extremities). Painless swelling of the feet and ankles is common and becomes more likely as you get older. It is also common in looser tissues, like around your eyes.  °When the affected area is squeezed, the fluid may move out of that spot and leave a dent for a few moments. This dent is called pitting.  °CAUSES  °There are many possible causes of edema. Eating too much salt and being on your feet or sitting for a long time can cause edema in your legs and ankles. Hot weather may make edema worse. Common medical causes of edema include: °· Heart failure. °· Liver disease. °· Kidney disease. °· Weak blood vessels in your legs. °· Cancer. °· An injury. °· Pregnancy. °· Some medications. °· Obesity.  °SYMPTOMS  °Edema is usually painless. Your skin may look swollen or shiny.  °DIAGNOSIS  °Your health care provider may be able to diagnose edema by asking about your medical history and doing a physical exam. You may need to have tests such as X-rays, an electrocardiogram, or blood tests to check for medical conditions that may cause edema.  °TREATMENT  °Edema treatment depends on the cause. If you have heart, liver, or kidney disease, you need the treatment appropriate for these conditions. General treatment may include: °· Elevation of the affected body part above the level of your heart. °· Compression of the affected body part. Pressure from elastic bandages or support stockings squeezes the tissues and forces fluid back into the blood vessels. This keeps fluid from entering the tissues. °· Restriction of fluid and salt intake. °· Use of a water pill (diuretic). These medications are appropriate only for some types of edema. They pull fluid out of your body and make you urinate more often. This  gets rid of fluid and reduces swelling, but diuretics can have side effects. Only use diuretics as directed by your health care provider. °HOME CARE INSTRUCTIONS  °· Keep the affected body part above the level of your heart when you are lying down.   °· Do not sit still or stand for prolonged periods.   °· Do not put anything directly under your knees when lying down. °· Do not wear constricting clothing or garters on your upper legs.   °· Exercise your legs to work the fluid back into your blood vessels. This may help the swelling go down.   °· Wear elastic bandages or support stockings to reduce ankle swelling as directed by your health care provider.   °· Eat a low-salt diet to reduce fluid if your health care provider recommends it.   °· Only take medicines as directed by your health care provider.  °SEEK MEDICAL CARE IF:  °· Your edema is not responding to treatment. °· You have heart, liver, or kidney disease and notice symptoms of edema. °· You have edema in your legs that does not improve after elevating them.   °· You have sudden and unexplained weight gain. °SEEK IMMEDIATE MEDICAL CARE IF:  °· You develop shortness of breath or chest pain.   °· You cannot breathe when you lie down. °· You develop pain, redness, or warmth in the swollen areas.   °· You have heart, liver, or kidney disease and suddenly get edema. °· You have a fever and your symptoms suddenly get worse. °MAKE SURE YOU:  °·   Understand these instructions.  Will watch your condition.  Will get help right away if you are not doing well or get worse.   This information is not intended to replace advice given to you by your health care provider. Make sure you discuss any questions you have with your health care provider.   Document Released: 04/10/2005 Document Revised: 05/01/2014 Document Reviewed: 01/31/2013 Elsevier Interactive Patient Education 2016 [employer]  Knee Pain Knee pain is a very common symptom and can have many  causes. Knee pain often goes away when you follow your health care provider's instructions for relieving pain and discomfort at home. However, knee pain can develop into a condition that needs treatment. Some conditions may include:  Arthritis caused by wear and tear (osteoarthritis).  Arthritis caused by swelling and irritation (rheumatoid arthritis or gout).  A cyst or growth in your knee.  An infection in your knee joint.  An injury that will not heal.  Damage, swelling, or irritation of the tissues that support your knee (torn ligaments or tendinitis). If your knee pain continues, additional tests may be ordered to diagnose your condition. Tests may include X-rays or other imaging studies of your knee. You may also need to have fluid removed from your knee. Treatment for ongoing knee pain depends on the cause, but treatment may include:  Medicines to relieve pain or swelling.  Steroid injections in your knee.  Physical therapy.  Surgery. HOME CARE INSTRUCTIONS  Take medicines only as directed by your health care provider.  Rest your knee and keep it raised (elevated) while you are resting.  Do not do things that cause or worsen pain.  Avoid high-impact activities or exercises, such as running, jumping rope, or doing jumping jacks.  Apply ice to the knee area:  Put ice in a plastic bag.  Place a towel between your skin and the bag.  Leave the ice on for 20 minutes, 2-3 times a day.  Ask your health care provider if you should wear an elastic knee support.  Keep a pillow under your knee when you sleep.  Lose weight if you are overweight. Extra weight can put pressure on your knee.  Do not use any tobacco products, including cigarettes, chewing tobacco, or electronic cigarettes. If you need help quitting, ask your health care provider. Smoking may slow the healing of any bone and joint problems that you may have. SEEK MEDICAL CARE IF:  Your knee pain continues,  changes, or gets worse.  You have a fever along with knee pain.  Your knee buckles or locks up.  Your knee becomes more swollen. SEEK IMMEDIATE MEDICAL CARE IF:   Your knee joint feels hot to the touch.  You have chest pain or trouble breathing.   This information is not intended to replace advice given to you by your health care provider. Make sure you discuss any questions you have with your health care provider.   Document Released: 02/05/2007 Document Revised: 05/01/2014 Document Reviewed: 11/24/2013 Elsevier Interactive Patient Education [employer]

## 2015-09-22 ENCOUNTER — Emergency Department (HOSPITAL_COMMUNITY): Payer: Self-pay

## 2015-09-22 ENCOUNTER — Emergency Department (HOSPITAL_BASED_OUTPATIENT_CLINIC_OR_DEPARTMENT_OTHER)
Admit: 2015-09-22 | Discharge: 2015-09-22 | Disposition: A | Discharge status: Home / Self Care | Payer: Self-pay | Attending: Emergency Medicine | Admitting: Emergency Medicine

## 2015-09-22 ENCOUNTER — Inpatient Hospital Stay (HOSPITAL_COMMUNITY)
Admission: EM | Admit: 2015-09-22 | Discharge: 2015-09-27 | DRG: 291 | Disposition: A | Discharge status: Rehabilitation Facility | Payer: Self-pay | Attending: Internal Medicine | Admitting: Internal Medicine

## 2015-09-22 ENCOUNTER — Encounter (HOSPITAL_COMMUNITY): Payer: Self-pay | Admitting: Emergency Medicine

## 2015-09-22 DIAGNOSIS — M1711 Unilateral primary osteoarthritis, right knee: Secondary | ICD-10-CM | POA: Insufficient documentation

## 2015-09-22 DIAGNOSIS — E662 Morbid (severe) obesity with alveolar hypoventilation: Secondary | ICD-10-CM | POA: Insufficient documentation

## 2015-09-22 DIAGNOSIS — R0609 Other forms of dyspnea: Secondary | ICD-10-CM | POA: Insufficient documentation

## 2015-09-22 DIAGNOSIS — R0902 Hypoxemia: Secondary | ICD-10-CM | POA: Diagnosis present

## 2015-09-22 DIAGNOSIS — D510 Vitamin B12 deficiency anemia due to intrinsic factor deficiency: Secondary | ICD-10-CM

## 2015-09-22 DIAGNOSIS — Z833 Family history of diabetes mellitus: Secondary | ICD-10-CM

## 2015-09-22 DIAGNOSIS — I2781 Cor pulmonale (chronic): Secondary | ICD-10-CM | POA: Diagnosis present

## 2015-09-22 DIAGNOSIS — I509 Heart failure, unspecified: Secondary | ICD-10-CM

## 2015-09-22 DIAGNOSIS — E872 Acidosis: Secondary | ICD-10-CM | POA: Diagnosis present

## 2015-09-22 DIAGNOSIS — I2510 Atherosclerotic heart disease of native coronary artery without angina pectoris: Secondary | ICD-10-CM

## 2015-09-22 DIAGNOSIS — Z8249 Family history of ischemic heart disease and other diseases of the circulatory system: Secondary | ICD-10-CM

## 2015-09-22 DIAGNOSIS — R229 Localized swelling, mass and lump, unspecified: Secondary | ICD-10-CM

## 2015-09-22 DIAGNOSIS — R0789 Other chest pain: Secondary | ICD-10-CM

## 2015-09-22 DIAGNOSIS — M7989 Other specified soft tissue disorders: Secondary | ICD-10-CM

## 2015-09-22 DIAGNOSIS — Z9884 Bariatric surgery status: Secondary | ICD-10-CM

## 2015-09-22 DIAGNOSIS — Z7982 Long term (current) use of aspirin: Secondary | ICD-10-CM

## 2015-09-22 DIAGNOSIS — I272 Other secondary pulmonary hypertension: Secondary | ICD-10-CM | POA: Diagnosis present

## 2015-09-22 DIAGNOSIS — I110 Hypertensive heart disease with heart failure: Principal | ICD-10-CM | POA: Diagnosis present

## 2015-09-22 DIAGNOSIS — J9601 Acute respiratory failure with hypoxia: Secondary | ICD-10-CM | POA: Diagnosis present

## 2015-09-22 DIAGNOSIS — Z6841 Body Mass Index (BMI) 40.0 and over, adult: Secondary | ICD-10-CM

## 2015-09-22 DIAGNOSIS — D500 Iron deficiency anemia secondary to blood loss (chronic): Secondary | ICD-10-CM | POA: Diagnosis present

## 2015-09-22 DIAGNOSIS — I959 Hypotension, unspecified: Secondary | ICD-10-CM | POA: Insufficient documentation

## 2015-09-22 DIAGNOSIS — J9600 Acute respiratory failure, unspecified whether with hypoxia or hypercapnia: Secondary | ICD-10-CM | POA: Diagnosis present

## 2015-09-22 DIAGNOSIS — I9589 Other hypotension: Secondary | ICD-10-CM | POA: Diagnosis not present

## 2015-09-22 DIAGNOSIS — L728 Other follicular cysts of the skin and subcutaneous tissue: Secondary | ICD-10-CM | POA: Diagnosis present

## 2015-09-22 DIAGNOSIS — I252 Old myocardial infarction: Secondary | ICD-10-CM

## 2015-09-22 DIAGNOSIS — Z79899 Other long term (current) drug therapy: Secondary | ICD-10-CM

## 2015-09-22 DIAGNOSIS — Z86718 Personal history of other venous thrombosis and embolism: Secondary | ICD-10-CM

## 2015-09-22 DIAGNOSIS — IMO0002 Reserved for concepts with insufficient information to code with codable children: Secondary | ICD-10-CM

## 2015-09-22 DIAGNOSIS — Z9981 Dependence on supplemental oxygen: Secondary | ICD-10-CM | POA: Insufficient documentation

## 2015-09-22 DIAGNOSIS — Z9114 Patient's other noncompliance with medication regimen: Secondary | ICD-10-CM

## 2015-09-22 DIAGNOSIS — M79609 Pain in unspecified limb: Secondary | ICD-10-CM

## 2015-09-22 DIAGNOSIS — Z9049 Acquired absence of other specified parts of digestive tract: Secondary | ICD-10-CM

## 2015-09-22 DIAGNOSIS — D509 Iron deficiency anemia, unspecified: Secondary | ICD-10-CM

## 2015-09-22 DIAGNOSIS — I5033 Acute on chronic diastolic (congestive) heart failure: Secondary | ICD-10-CM | POA: Diagnosis present

## 2015-09-22 DIAGNOSIS — M79601 Pain in right arm: Secondary | ICD-10-CM

## 2015-09-22 DIAGNOSIS — Z91013 Allergy to seafood: Secondary | ICD-10-CM

## 2015-09-22 LAB — I-STAT ARTERIAL BLOOD GAS, ED
ACID-BASE EXCESS: 1 mmol/L (ref 0.0–2.0)
BICARBONATE: 28.5 meq/L — AB (ref 20.0–24.0)
O2 SAT: 95 %
TCO2: 30 mmol/L (ref 0–100)
pCO2 arterial: 57.3 mmHg (ref 35.0–45.0)
pH, Arterial: 7.303 — ABNORMAL LOW (ref 7.350–7.450)
pO2, Arterial: 85 mmHg (ref 80.0–100.0)

## 2015-09-22 LAB — CBC
HCT: 33.3 % — ABNORMAL LOW (ref 36.0–46.0)
Hemoglobin: 8.9 g/dL — ABNORMAL LOW (ref 12.0–15.0)
MCH: 21.9 pg — AB (ref 26.0–34.0)
MCHC: 26.7 g/dL — AB (ref 30.0–36.0)
MCV: 82 fL (ref 78.0–100.0)
PLATELETS: 281 10*3/uL (ref 150–400)
RBC: 4.06 MIL/uL (ref 3.87–5.11)
RDW: 21.2 % — ABNORMAL HIGH (ref 11.5–15.5)
WBC: 5.3 10*3/uL (ref 4.0–10.5)

## 2015-09-22 LAB — I-STAT TROPONIN, ED: Troponin i, poc: 0 ng/mL (ref 0.00–0.08)

## 2015-09-22 LAB — TROPONIN I

## 2015-09-22 LAB — BASIC METABOLIC PANEL
Anion gap: 7 (ref 5–15)
BUN: 13 mg/dL (ref 6–20)
CALCIUM: 8.5 mg/dL — AB (ref 8.9–10.3)
CHLORIDE: 103 mmol/L (ref 101–111)
CO2: 29 mmol/L (ref 22–32)
CREATININE: 0.74 mg/dL (ref 0.44–1.00)
GFR calc non Af Amer: >60 mL/min (ref >60)
GLUCOSE: 105 mg/dL — AB (ref 65–99)
Potassium: 3.9 mmol/L (ref 3.5–5.1)
Sodium: 139 mmol/L (ref 135–145)

## 2015-09-22 LAB — BRAIN NATRIURETIC PEPTIDE: B Natriuretic Peptide: 313.2 pg/mL — ABNORMAL HIGH (ref 0.0–100.0)

## 2015-09-22 MED ORDER — ENOXAPARIN SODIUM 40 MG/0.4ML ~~LOC~~ SOLN
40.0000 mg | SUBCUTANEOUS | Status: DC
  Administered 2015-09-22: 40 mg via SUBCUTANEOUS
  Filled 2015-09-22: qty 0.4

## 2015-09-22 MED ORDER — DEXTROSE 5 % IV SOLN
1.0000 g | Freq: Once | INTRAVENOUS | Status: AC
  Administered 2015-09-22: 1 g via INTRAVENOUS
  Filled 2015-09-22: qty 10

## 2015-09-22 MED ORDER — FERROUS SULFATE 325 (65 FE) MG PO TABS
325.0000 mg | ORAL_TABLET | Freq: Three times a day (TID) | ORAL | Status: DC
  Administered 2015-09-22 – 2015-09-27 (×14): 325 mg via ORAL
  Filled 2015-09-22 (×15): qty 1

## 2015-09-22 MED ORDER — DEXTROSE 5 % IV SOLN
500.0000 mg | Freq: Once | INTRAVENOUS | Status: AC
  Administered 2015-09-22: 500 mg via INTRAVENOUS
  Filled 2015-09-22: qty 500

## 2015-09-22 MED ORDER — FUROSEMIDE 10 MG/ML IJ SOLN
60.0000 mg | Freq: Once | INTRAMUSCULAR | Status: AC
  Administered 2015-09-22: 60 mg via INTRAVENOUS
  Filled 2015-09-22: qty 6

## 2015-09-22 MED ORDER — PROSIGHT PO TABS
1.0000 | ORAL_TABLET | Freq: Every day | ORAL | Status: DC
  Administered 2015-09-22 – 2015-09-27 (×6): 1 via ORAL
  Filled 2015-09-22 (×6): qty 1

## 2015-09-22 MED ORDER — CYANOCOBALAMIN 1000 MCG/ML IJ SOLN
1000.0000 ug | Freq: Every day | INTRAMUSCULAR | Status: DC
  Administered 2015-09-22 – 2015-09-27 (×6): 1000 ug via INTRAMUSCULAR
  Filled 2015-09-22 (×6): qty 1

## 2015-09-22 MED ORDER — MORPHINE SULFATE (PF) 4 MG/ML IV SOLN
8.0000 mg | Freq: Once | INTRAVENOUS | Status: AC
  Administered 2015-09-22: 8 mg via INTRAVENOUS
  Filled 2015-09-22: qty 2

## 2015-09-22 MED ORDER — DOCUSATE SODIUM 100 MG PO CAPS
100.0000 mg | ORAL_CAPSULE | Freq: Two times a day (BID) | ORAL | Status: DC
  Administered 2015-09-22 – 2015-09-26 (×6): 100 mg via ORAL
  Filled 2015-09-22 (×9): qty 1

## 2015-09-22 MED ORDER — ASPIRIN EC 81 MG PO TBEC
81.0000 mg | DELAYED_RELEASE_TABLET | Freq: Every day | ORAL | Status: DC
  Administered 2015-09-23 – 2015-09-27 (×5): 81 mg via ORAL
  Filled 2015-09-22 (×6): qty 1

## 2015-09-22 MED ORDER — ACETAMINOPHEN 325 MG PO TABS
650.0000 mg | ORAL_TABLET | Freq: Four times a day (QID) | ORAL | Status: DC | PRN
  Administered 2015-09-23: 650 mg via ORAL
  Filled 2015-09-22: qty 2

## 2015-09-22 MED ORDER — IOPAMIDOL (ISOVUE-300) INJECTION 61%
INTRAVENOUS | Status: AC
  Administered 2015-09-22: 75 mL
  Filled 2015-09-22: qty 75

## 2015-09-22 NOTE — ED Provider Notes (Signed)
CSN: [id number]     Arrival date & time 09/22/15  5885 History   First MD Initiated Contact with Patient 09/22/15 1010     Chief Complaint  Patient presents with  . Chest Pain  . Leg Swelling     (Consider location/radiation/quality/duration/timing/severity/associated sxs/prior Treatment) HPI  50 year old female presents with acute right-sided chest pain that started at 2 AM. Woke her up out of sleep. Patient states the pain is sharp. It starts in her right upper arm and radiates into her chest. No back pain. She has been feeling short of breath although this is much better after being placed on supplemental oxygen in the ER. Has also had a cough that started at the same time as the pain. For 24-hour she's been having bilateral lower extremity swelling from her feet to her thighs. More severe than when she was here one month ago and given a 5 day course of Lasix. Patient has a reported history of CHF and a myocardial infarction in the past.  Past Medical History  Diagnosis Date  . CHF (congestive heart failure) (HCC)   . Anemia   . DVT (deep venous thrombosis) (HCC) "after my heart attack"    "one of my legs"  . Heavy menses   . Myocardial infarction [facility]) 2003?  Marland Kitchen History of blood transfusion "I've had alot"    "I'm anemic"  . Arthritis     "knees" (07/31/2015)   Past Surgical History  Procedure Laterality Date  . Cesarean section  1985; 1991; 1993  . Gastric bypass  2004  . Tonsillectomy    . Carpal tunnel release Right   . Cholecystectomy  2004    w/gastric OR   Family History  Problem Relation Age of Onset  . Stroke Mother   . Hypertension Mother   . COPD Father   . Cancer Neg Hx   . Diabetes type II Daughter    Social History  Substance Use Topics  . Smoking status: Never Smoker   . Smokeless tobacco: Never Used  . Alcohol Use: No     Comment: "I haven't drank in 20 years"   OB History    No data available     Review of Systems  Constitutional: Negative for  fever.  Respiratory: Positive for cough and shortness of breath.   Cardiovascular: Positive for chest pain and leg swelling.  Musculoskeletal: Positive for myalgias.  All other systems reviewed and are negative.     Allergies  Shellfish allergy  Home Medications   Prior to Admission medications   Medication Sig Start Date End Date Taking? Authorizing Provider  acetaminophen (TYLENOL) 500 MG tablet Take 500 mg by mouth every 6 (six) hours as needed for mild pain.    Historical Provider, MD  docusate sodium (COLACE) 100 MG capsule Take 1 capsule (100 mg total) by mouth 2 (two) times daily. Patient not taking: Reported on 08/28/2015 08/01/15   Maretta Bees, MD  ferrous sulfate 325 (65 FE) MG tablet Take 1 tablet (325 mg total) by mouth 3 (three) times daily with meals. 08/01/15   Shanker Levora Dredge, MD  furosemide (LASIX) 20 MG tablet Take 1 tablet (20 mg total) by mouth daily. 08/28/15   Benjiman Core, MD  hydrochlorothiazide (HYDRODIURIL) 25 MG tablet Take 1 tablet (25 mg total) by mouth daily. 08/01/15   Shanker Levora Dredge, MD  HYDROcodone-acetaminophen (NORCO/VICODIN) 5-325 MG tablet Take 1-2 tablets by mouth every 6 (six) hours as needed. 08/28/15   Benjiman Core,  MD  Potassium Chloride ER 20 MEQ TBCR Take 20 mEq by mouth daily. 08/28/15   Benjiman Core, MD   Pulse 94  Temp(Src) 98.3 F (36.8 C) (Oral)  Resp 25  Ht  (1.448 m)  Wt 366 lb (166.017 kg)  BMI 79.18 kg/m2  SpO2 93%  LMP 09/03/2015 (Approximate) Physical Exam  Constitutional: She is oriented to person, place, and time. She appears well-developed and well-nourished.  Morbidly obese. Resting comfortably in no acute distress  HENT:  Head: Normocephalic and atraumatic.  Right Ear: External ear normal.  Left Ear: External ear normal.  Nose: Nose normal.  Eyes: Right eye exhibits no discharge. Left eye exhibits no discharge.  Cardiovascular: Normal rate, regular rhythm, normal heart sounds and intact distal pulses.    Pulses:      Radial pulses are 2+ on the right side, and 2+ on the left side.  Pulmonary/Chest: Effort normal and breath sounds normal. She exhibits tenderness (right anterior chest. No rash).  Abdominal: Soft. There is no tenderness.  Musculoskeletal:  Tenderness to medial aspect of right upper arm. Difficult to tell but no obvious swelling or redness Both legs are extremely large. No pitting edema appreciated but it is difficult to tell if this is edema or from her obesity  Neurological: She is alert and oriented to person, place, and time.  Skin: Skin is warm and dry.  Nursing note and vitals reviewed.   ED Course  Procedures (including critical care time) Labs Review Labs Reviewed  BASIC METABOLIC PANEL - Abnormal; Notable for the following:    Glucose, Bld 105 (*)    Calcium 8.5 (*)    All other components within normal limits  CBC - Abnormal; Notable for the following:    Hemoglobin 8.9 (*)    HCT 33.3 (*)    MCH 21.9 (*)    MCHC 26.7 (*)    RDW 21.2 (*)    All other components within normal limits  BRAIN NATRIURETIC PEPTIDE - Abnormal; Notable for the following:    B Natriuretic Peptide 313.2 (*)    All other components within normal limits  I-STAT ARTERIAL BLOOD GAS, ED - Abnormal; Notable for the following:    pH, Arterial 7.303 (*)    pCO2 arterial 57.3 (*)    Bicarbonate 28.5 (*)    All other components within normal limits  CULTURE, BLOOD (ROUTINE X 2)  CULTURE, BLOOD (ROUTINE X 2)  I-STAT TROPOININ, ED    Imaging Review Dg Chest 2 View  09/22/2015  CLINICAL DATA:  Chest pain and shortness of breath beginning this morning. EXAM: CHEST  2 VIEW COMPARISON:  07/30/2015 and chest CT 07/31/2015 FINDINGS: Lungs are hypoinflated and demonstrate opacification over the left lower lobe likely pneumonia. No definite effusion. Mild prominence of the perihilar markings. Stable cardiomegaly. Remainder of the exam is unchanged. IMPRESSION: Opacification over the posterior  left lower lobe likely pneumonia. Stable cardiomegaly.  Possible mild vascular congestion. Electronically Signed   By: Elberta Fortis M.D.   On: 09/22/2015 10:40   Ct Chest W Contrast  09/22/2015  CLINICAL DATA:  Further evaluation of possible axillary cysts/abscess seen on Vascular Ultrasound. EXAM: CT CHEST WITH CONTRAST TECHNIQUE: Multidetector CT imaging of the chest was performed during intravenous contrast administration. CONTRAST:  75 mL Isovue-300 COMPARISON:  07/31/2015 FINDINGS: 13 mm precarinal lymph node and 13 mm sub carinal lymph node. Mild cardiac enlargement. Minimal pericardial thickening versus trace pericardial effusion. Left lung base is excluded from the image.  No pleural effusion identified. Visualized portion of left lung clear except for areas of mild atelectasis. There is mild dependent atelectasis in the right lower lobe. Mild mosaic hazy attenuation bilaterally. There is no evidence of significant axillary adenopathy on either side. A there is no evidence of abscess or other fluid collection in either axilla. There are left dorsal thoracic varices. Images through the upper abdomen show no acute findings. IMPRESSION: No evidence of axillary abnormality on either side A few nonspecific mildly enlarged mediastinal lymph nodes are present. Mild mosaic attenuation of both lungs with no other findings to increase specificity of this finding. Alternating areas of normal and mildly atelectatic lung can cause this appearance. Early findings of various infectious or inflammatory causes are another consideration. The study is somewhat limited by patient body habitus. Electronically Signed   By: Esperanza Heir M.D.   On: 09/22/2015 14:52   I have personally reviewed and evaluated these images and lab results as part of my medical decision-making.   EKG Interpretation   Date/Time:  Wednesday Sep 22 2015 09:59:18 EDT Ventricular Rate:  102 PR Interval:  153 QRS Duration: 100 QT Interval:   319 QTC Calculation: 415 R Axis:   89 Text Interpretation:  Sinus tachycardia Nonspecific T abnormalities,  inferior leads no significant change since April 2017 Confirmed by  Criss Alvine MD, Khamila Bassinger [phone]) on 09/22/2015 10:10:13 AM      MDM   Final diagnoses:  Acute respiratory failure, unspecified whether with hypoxia or hypercapnia (HCC)    Patient's chest pain appears to be muscular in etiology. However she is hypoxic which is new for her. O2 comes up adequately on only 2 L oxygen. Chest x-ray shows concern for pneumonia and given cough will treat with IV antibiotics. Ultrasound obtained to rule out DVT of upper extremity given point tenderness. DVT ultrasound does not show DVT but does show large cystic/solid structures. However a CT scan of this area in the axilla shows no obvious findings. Unclear what this was. Due to her hypoxia will need to be admitted. Internal medicine teaching service will admit.    Pricilla Loveless, MD 09/22/15 [phone]

## 2015-09-22 NOTE — H&P (Signed)
Date: 09/22/2015               Patient Name:  Dana Abbott MRN: [id number]  DOB: 1965-06-07 Age / Sex: 50 y.o., female   PCP: [facility]         Medical Service: Internal Medicine Teaching Service         Attending Physician: Dr. Pricilla Loveless, MD    First Contact: Dr. Orest Dikes Pager: [phone]  Second Contact: Dr. Meyer Cory Pager: [phone]       After Hours (After 5p/  First Contact Pager: [phone]  weekends / holidays): Second Contact Pager: [phone]   Chief Complaint: Right Sided Chest Pain  History of Present Illness: Ms. Swords is a 50 year old female with a past medical history significant for severe obesity (BMI 80) s/p gastric bypass in early 2000s, CAD s/p MI in 2003, DVT s/p one year of coumadin, anemia, Fe deficiency, B12 deficiency, possible CHF (EF 6-65% 11/2014) presents with acute onset chest pain and shortness of breath. Patient reports that she was woken up this morning at around 2 am with sharp-stabbing right sided chest pain. No exacerbating or relieving factors. Reports that pain began in her right arm and radiated to her right chest. Does reports shortness of breath but notes she has had shortness of breath for the past week. Does note increased lower extremity edema for the the past week. Reports this does not feel like her prior MI. Does note headache, mainly occipital radiating across right side of her head. Describes as pounding. Improved in the ED. Denies any nausea, vomiting, abdominal pain, no jaw/neck pain, vision changes.   She took 2 aspirin 81 mg this morning when her chest pain started, which did not help with the pain. In the ED she was given azithromycin 500 mg, ceftriaxone 1 mg, and morphine 8 mg. She says the morphine helped with both her headache and her chest pain. She no longer takes her medications as she does not have insurance, but she does take over the counter iron supplements every other day and Tylenol as needed for her arthritis.  She recently presented to the ED in April for anemia resulting from heavy menses, requiring 2 units of pRBCs. At that time she was found to have elevated intrinsic factor antibodies, low B12, and low iron levels.  Meds: Current Facility-Administered Medications  Medication Dose Route Frequency Provider Last Rate Last Dose  . azithromycin (ZITHROMAX) 500 mg in dextrose 5 % 250 mL IVPB  500 mg Intravenous Once Pricilla Loveless, MD 250 mL/hr at 09/22/15 1325 500 mg at 09/22/15 1325  . iopamidol (ISOVUE-300) 61 % injection            Current Outpatient Prescriptions  Medication Sig Dispense Refill  . Acetaminophen (TYLENOL 8 HOUR ARTHRITIS PAIN PO) Take 1,300 mg by mouth every 8 (eight) hours as needed (arthritis pain).    Marland Kitchen aspirin 81 MG chewable tablet Chew 162 mg by mouth once.    . docusate sodium (COLACE) 100 MG capsule Take 1 capsule (100 mg total) by mouth 2 (two) times daily. (Patient not taking: Reported on 08/28/2015) 10 capsule 0  . ferrous sulfate 325 (65 FE) MG tablet Take 1 tablet (325 mg total) by mouth 3 (three) times daily with meals. (Patient not taking: Reported on 09/22/2015) 90 tablet 0  . furosemide (LASIX) 20 MG tablet Take 1 tablet (20 mg total) by mouth daily. (Patient not taking: Reported on 09/22/2015) 5 tablet 0  .  hydrochlorothiazide (HYDRODIURIL) 25 MG tablet Take 1 tablet (25 mg total) by mouth daily. 30 tablet 0  . HYDROcodone-acetaminophen (NORCO/VICODIN) 5-325 MG tablet Take 1-2 tablets by mouth every 6 (six) hours as needed. (Patient not taking: Reported on 09/22/2015) 10 tablet 0  . Potassium Chloride ER 20 MEQ TBCR Take 20 mEq by mouth daily. (Patient not taking: Reported on 09/22/2015) 5 tablet 0    Allergies: Allergies as of 09/22/2015 - Review Complete 09/22/2015  Allergen Reaction Noted  . Shellfish allergy  08/28/2015   Past Medical History  Diagnosis Date  . CHF (congestive heart failure) (HCC)   . Anemia   . DVT (deep venous thrombosis) (HCC) "after my  heart attack"    "one of my legs"  . Heavy menses   . Myocardial infarction [facility]) 2003?  Marland Kitchen History of blood transfusion "I've had alot"    "I'm anemic"  . Arthritis     "knees" (07/31/2015)   Past Surgical History  Procedure Laterality Date  . Cesarean section  1985; 1991; 1993  . Gastric bypass  2004  . Tonsillectomy    . Carpal tunnel release Right   . Cholecystectomy  2004    w/gastric OR   Family History  Problem Relation Age of Onset  . Stroke Mother   . Hypertension Mother   . COPD Father   . Cancer Neg Hx   . Diabetes type II Daughter    Social History   Social History  . Marital Status: Single    Spouse Name: N/A  . Number of Children: N/A  . Years of Education: N/A   Occupational History  . Not on file.   Social History Main Topics  . Smoking status: Never Smoker   . Smokeless tobacco: Never Used  . Alcohol Use: No     Comment: "I haven't drank in 20 years"  . Drug Use: No  . Sexual Activity: Not Currently   Other Topics Concern  . Not on file   Social History Narrative    Review of Systems: Pertinent items noted in HPI and remainder of comprehensive ROS otherwise negative.  Physical Exam: Blood pressure 119/78, pulse 76, temperature 98.3 F (36.8 C), temperature source Oral, resp. rate 15, height  (1.448 m), weight 366 lb (166.017 kg), last menstrual period 09/03/2015, SpO2 100 %. General: Obese female, laying in bed in no acute distress. HEENT: No LAD. EOMI. PEERLA. CV: RRR. Normal S1/S2. No m/r/g. JVD ~13 cm. Pulm: CTAB. Fine crackles bilaterally in lung bases. Auscultation limited due to body habitus. MSK: Tenderness to medial aspect of right upper arm. Difficult to tell but no obvious swelling or redness Abdomen: Soft, nontender, nondistended. Positive bowel sounds. Breast: Fibrous with some nodules in R breast around areola.  Ext: 3+ pitting edema in bilateral lower extremities. Erythematous changes around ankles. Significant  lymphadenopathy.   Lab results: Basic Metabolic Panel:  Recent Labs  16/10/96 1000  NA 139  K 3.9  CL 103  CO2 29  GLUCOSE 105*  BUN 13  CREATININE 0.74  CALCIUM 8.5*   Liver Function Tests: No results for input(s): AST, ALT, ALKPHOS, BILITOT, PROT, ALBUMIN in the last 72 hours. No results for input(s): LIPASE, AMYLASE in the last 72 hours. No results for input(s): AMMONIA in the last 72 hours. CBC:  Recent Labs  09/22/15 1000  WBC 5.3  HGB 8.9*  HCT 33.3*  MCV 82.0  PLT 281   Imaging results:  Dg Chest 2 View  09/22/2015  CLINICAL DATA:  Chest pain and shortness of breath beginning this morning. EXAM: CHEST  2 VIEW COMPARISON:  07/30/2015 and chest CT 07/31/2015 FINDINGS: Lungs are hypoinflated and demonstrate opacification over the left lower lobe likely pneumonia. No definite effusion. Mild prominence of the perihilar markings. Stable cardiomegaly. Remainder of the exam is unchanged. IMPRESSION: Opacification over the posterior left lower lobe likely pneumonia. Stable cardiomegaly.  Possible mild vascular congestion. Electronically Signed   By: Elberta Fortis M.D.   On: 09/22/2015 10:40   Other results: EKG: Sinus tachycardia. Nonspecific T abnormalities, inferior leads. No significant change since April 2017.  Assessment & Plan by Problem:  Acute Hypoxic Respiratory Failure: Patient hypoxic to 88% on room air in the ED. Mildly tachycardic to 102 on arrival to the ED. CTA done in the ED. Negative for PE. CXR was read as likely left lower lobe pneumonia and she was started on Ceftriaxone and Azithromycin in the ED. However, she is afebrile with no leukocytosis. No clinic sings of pneumonia. Physical exam only notable for inspiratory crackles at bilateral lungs - likely from atelectasis. Patient's body habitus makes CXR difficult to interpret. BNP was elevated at 313 today (63 on 5/6). Weight 399 lb today, up from 368 lb on 4/8. Pitting edema in B/L LE on exam. Low clinical  suspicion for infectious process at this time. Suspect this is volume overload.  Has history of CHF listed on her chart but recent echocardiogram in 11/2014 and showed EF 60-65%, no comments on diastolic or PAP. She also had a stress test and myocardial perfusion scan in 11/2014 that were noted to be low risk studies.  She did have ABG done in the ED with showed respiratory acidosis with compensation suggesting chronic process. She is super obese with BMI >80, HCO3>27, PaCO>45 making obesity hypoventilation a likely cause of her respiratory failure. Will get ECHO to rule out CHF and assess for Pulmonary Artery Pressures. Suspect patient likely has elevated pulmonary artery pressures from chronic obesity hypoventilation syndrome causing right sided heart failure symptoms.  -Lasix 60 mg IV x 1 dose -Strict I&O -Daily Weights -ECHO pending -Telemetry -Wean O2 to room air -D/C antibiotics   Atypical Chest Pain: Patient with right sided chest pain. Sharp and stabbing in nature. Pain starts in her right arm and radiates to her right chest. EKG with no acute ischemic changes. Will check troponin x1. Low suspicion for ACS at this time.  -Tylenol prn -Troponin   Bilateral Axillary Complex Cyst: Cysts found on duplex. Non-vascularized mass in right axilla 8.6 x 6.4 x 4.7 cm with a similar structure on left side, as per report.  - Dedicated axillary U/S  CAD s/p MI (2003): History of CAD with MI in 2003. She reports not taking any medications at home due to lack of insurance and being unable to afford medications. Denies any history of stents or CABG.  -ASA 81 mg daily -consider BB once acute CHF exacerbation ruled out -consider starting ACE-I and high intensity statin  Pernicious Anemia and Iron Deficiency Anemia: Recent admission in 07/2015 for anemia requiring 2 U pRBCs. At that time she had low B12 and ferritin of 4. Elevated intrinsic factor antibody in 07/2015. History of gastric bypass. Reports not  taking B12 at home due to lack of funds. Reports taking Fe supplements every other day at home.  - B12 injection 1000 mcg, daily - Ferrous sulfate 325 mg, daily  Hx of Gastric Bypass (Rouex-en-Y?): Patient reports gastric bypass surgery done in  early 2000s. 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 Patient reports she has not been taking any supplements at home. Corrected Ca wnl. B12 and iron noted above.  -Check Vit D and folate - Nutrition consult - B12 and Iron per above - Consider starting Multivitamin   CHF: Patient reports CHF history. Last echocardiogram in 11/2014 showed EF 60-65%. - Repeat echocardiogram to evaluate for CHF.  Hx of DVT: after MI in 2003. Reports being on warfarin for a year. Unclear etiology if this was provoked or not.   Super Obesity: BMI 80. Contributing to likely hypoventilation syndrome as above. - Nutrition consult - PT consult  DVT PPx: Lovenox   CODE: FULL  Dispo: Disposition is deferred at this time, awaiting improvement of current medical problems.   The patient does have a current PCP [facility]) and does need an [facility] hospital follow-up appointment after discharge.  The patient does not have transportation limitations that hinder transportation to clinic appointments.  Signed: Valentino Nose, MD 09/22/2015, 1:58 PM

## 2015-09-22 NOTE — Progress Notes (Signed)
Preliminary results by tech - Right Upper Ext. Venous Duplex Completed. Negative for deep and superficial vein thrombosis in the right arm. There is a large complex - cystic and solid mass in the axillary area bilaterally measuring approximately 8.6 x 2.3 x 6.4 cm on the right side and less than 8 cm on the left. Results given to Dr. Gwenlyn Fudge. Marilynne Halsted, BS, RDMS, RVT

## 2015-09-22 NOTE — ED Notes (Signed)
Patient transported to CT 

## 2015-09-22 NOTE — H&P (Signed)
Date: 09/22/2015               Patient Name:  Dana Abbott MRN: [id number]  DOB: 08/24/1965 Age / Sex: 50 y.o., female   PCP: [facility]              Medical Service: Internal Medicine Teaching Service              Attending Physician: Dr. Debe Coder, MD    First Contact: Mammie Lorenzo, MS 3 Pager: [phone]  Second Contact: Dr. Orest Dikes Pager: [phone]  Third Contact Dr. Meyer Cory Pager: [phone]       After Hours (After 5p/  First Contact Pager: [phone]  weekends / holidays): Second Contact Pager: [phone]   Chief Complaint: Chest pain  History of Present Illness:  Dana Abbott is a 50 y/o woman with past medical history significant for CHF, anemia, MI (2003), and DVT who presents for chest pain and shortness of breath. She says around 2 am this morning she woke up with stabbing pain in her chest and her right arm. She also felt pain and noticed swelling in her legs and feet. She noted shortness of breath but no palpitations. Starting Sunday she noticed an increasing burning sensation in her feet.  In the past few weeks she has had increasing shortness of breath unaccompanied by chest pain and has also noticed increased swelling in her lower extremities. She has never felt pain like this before and says she has difficulty walking from the pain and shortness of breath. She also notes a headache mainly in the right and posterior sides of her head, which she describes as pounding.  She took 2 aspirin 81 mg this morning when her chest pain started, which did not help with the pain. In the ED she was given azithromycin 500 mg, ceftriaxone 1 mg, and morphine 8 mg. She says the morphine helped with both her headache and her chest pain. She no longer takes her medications as she does not have insurance, but she does take over the counter iron supplements every other day and Tylenol as needed for her arthritis. She recently presented to the ED in April for anemia resulting  from heavy menses, requiring 2 units of pRBCs. At that time she was found to have elevated intrinsic factor antibodies, low B12, and low iron levels.  Meds: No current facility-administered medications for this encounter.   Current Outpatient Prescriptions  Medication Sig Dispense Refill  . Acetaminophen (TYLENOL 8 HOUR ARTHRITIS PAIN PO) Take 1,300 mg by mouth every 8 (eight) hours as needed (arthritis pain).    Marland Kitchen aspirin 81 MG chewable tablet Chew 162 mg by mouth once.    . docusate sodium (COLACE) 100 MG capsule Take 1 capsule (100 mg total) by mouth 2 (two) times daily. (Patient not taking: Reported on 08/28/2015) 10 capsule 0  . ferrous sulfate 325 (65 FE) MG tablet Take 1 tablet (325 mg total) by mouth 3 (three) times daily with meals. (Patient not taking: Reported on 09/22/2015) 90 tablet 0  . furosemide (LASIX) 20 MG tablet Take 1 tablet (20 mg total) by mouth daily. (Patient not taking: Reported on 09/22/2015) 5 tablet 0  . hydrochlorothiazide (HYDRODIURIL) 25 MG tablet Take 1 tablet (25 mg total) by mouth daily. 30 tablet 0  . HYDROcodone-acetaminophen (NORCO/VICODIN) 5-325 MG tablet Take 1-2 tablets by mouth every 6 (six) hours as needed. (Patient not taking: Reported on 09/22/2015) 10 tablet 0  . Potassium Chloride ER 20  MEQ TBCR Take 20 mEq by mouth daily. (Patient not taking: Reported on 09/22/2015) 5 tablet 0    Allergies: Allergies as of 09/22/2015 - Review Complete 09/22/2015  Allergen Reaction Noted  . Shellfish allergy  08/28/2015   Past Medical History  Diagnosis Date  . CHF (congestive heart failure) (HCC)   . Anemia   . DVT (deep venous thrombosis) (HCC) "after my heart attack"    "one of my legs"  . Heavy menses   . Myocardial infarction [facility]) 2003?  Marland Kitchen History of blood transfusion "I've had alot"    "I'm anemic"  . Arthritis     "knees" (07/31/2015)   Past Surgical History  Procedure Laterality Date  . Cesarean section  1985; 1991; 1993  . Gastric bypass  2004  .  Tonsillectomy    . Carpal tunnel release Right   . Cholecystectomy  2004    w/gastric OR   Family History  Problem Relation Age of Onset  . Stroke Mother   . Hypertension Mother   . COPD Father   . Cancer Neg Hx   . Diabetes type II Daughter    Social History   Social History  . Marital Status: Single    Spouse Name: N/A  . Number of Children: N/A  . Years of Education: N/A   Occupational History  . Not on file.   Social History Main Topics  . Smoking status: Never Smoker   . Smokeless tobacco: Never Used  . Alcohol Use: No     Comment: "I haven't drank in 20 years"  . Drug Use: No  . Sexual Activity: Not Currently   Other Topics Concern  . Not on file   Social History Narrative    Review of Systems: General: No fever, chills, unintentional weight loss CV: per HPI Pulm: per HPI GI: No nausea, vomiting, diarrhea GU: No dysuria, heavy menses  Physical Exam: Blood pressure 123/74, pulse 76, temperature 98.3 F (36.8 C), temperature source Oral, resp. rate 19, height  (1.448 m), weight 166.017 kg (366 lb), last menstrual period 09/03/2015, SpO2 100 %. General: Laying in bed. In no apparent distress. HEENT: No LAD. CV: RRR. Normal S1/S2. No m/r/g. JVD ~13 cm. Pulm: CTAB. Fine crackles bilaterally in lung bases. Auscultation limited due to body habitus. Abdomen: Soft, nontender, nondistended. Positive bowel sounds. Breast: Fibrous with some nodules. Ext: 3+ pitting edema in bilateral lower extremities. Skin red around ankles.  Lab results:  Basic Metabolic Panel:  Recent Labs  69/62/95 1000  NA 139  K 3.9  CL 103  CO2 29  GLUCOSE 105*  BUN 13  CREATININE 0.74  CALCIUM 8.5*   Liver Function Tests: No results for input(s): AST, ALT, ALKPHOS, BILITOT, PROT, ALBUMIN in the last 72 hours. No results for input(s): LIPASE, AMYLASE in the last 72 hours. No results for input(s): AMMONIA in the last 72 hours. CBC:  Recent Labs  09/22/15 1000  WBC  5.3  HGB 8.9*  HCT 33.3*  MCV 82.0  PLT 281   Cardiac Enzymes: No results for input(s): CKTOTAL, CKMB, CKMBINDEX, TROPONINI in the last 72 hours. BNP: No results for input(s): PROBNP in the last 72 hours. D-Dimer: No results for input(s): DDIMER in the last 72 hours. CBG: No results for input(s): GLUCAP in the last 72 hours. Hemoglobin A1C: No results for input(s): HGBA1C in the last 72 hours. Fasting Lipid Panel: No results for input(s): CHOL, HDL, LDLCALC, TRIG, CHOLHDL, LDLDIRECT in the last 72 hours.  Thyroid Function Tests: No results for input(s): TSH, T4TOTAL, FREET4, T3FREE, THYROIDAB in the last 72 hours. Anemia Panel: No results for input(s): VITAMINB12, FOLATE, FERRITIN, TIBC, IRON, RETICCTPCT in the last 72 hours. Coagulation: No results for input(s): LABPROT, INR in the last 72 hours. Urine Drug Screen: Drugs of Abuse  No results found for: LABOPIA, COCAINSCRNUR, LABBENZ, AMPHETMU, THCU, LABBARB  Alcohol Level: No results for input(s): ETH in the last 72 hours. Urinalysis: No results for input(s): COLORURINE, LABSPEC, PHURINE, GLUCOSEU, HGBUR, BILIRUBINUR, KETONESUR, PROTEINUR, UROBILINOGEN, NITRITE, LEUKOCYTESUR in the last 72 hours.  Invalid input(s): APPERANCEUR  Imaging results:  Dg Chest 2 View  09/22/2015  CLINICAL DATA:  Chest pain and shortness of breath beginning this morning. EXAM: CHEST  2 VIEW COMPARISON:  07/30/2015 and chest CT 07/31/2015 FINDINGS: Lungs are hypoinflated and demonstrate opacification over the left lower lobe likely pneumonia. No definite effusion. Mild prominence of the perihilar markings. Stable cardiomegaly. Remainder of the exam is unchanged. IMPRESSION: Opacification over the posterior left lower lobe likely pneumonia. Stable cardiomegaly.  Possible mild vascular congestion. Electronically Signed   By: Elberta Fortis M.D.   On: 09/22/2015 10:40   Ct Chest W Contrast  09/22/2015  CLINICAL DATA:  Further evaluation of possible  axillary cysts/abscess seen on Vascular Ultrasound. EXAM: CT CHEST WITH CONTRAST TECHNIQUE: Multidetector CT imaging of the chest was performed during intravenous contrast administration. CONTRAST:  75 mL Isovue-300 COMPARISON:  07/31/2015 FINDINGS: 13 mm precarinal lymph node and 13 mm sub carinal lymph node. Mild cardiac enlargement. Minimal pericardial thickening versus trace pericardial effusion. Left lung base is excluded from the image. No pleural effusion identified. Visualized portion of left lung clear except for areas of mild atelectasis. There is mild dependent atelectasis in the right lower lobe. Mild mosaic hazy attenuation bilaterally. There is no evidence of significant axillary adenopathy on either side. A there is no evidence of abscess or other fluid collection in either axilla. There are left dorsal thoracic varices. Images through the upper abdomen show no acute findings. IMPRESSION: No evidence of axillary abnormality on either side A few nonspecific mildly enlarged mediastinal lymph nodes are present. Mild mosaic attenuation of both lungs with no other findings to increase specificity of this finding. Alternating areas of normal and mildly atelectatic lung can cause this appearance. Early findings of various infectious or inflammatory causes are another consideration. The study is somewhat limited by patient body habitus. Electronically Signed   By: Esperanza Heir M.D.   On: 09/22/2015 14:52    Other results: EKG: Sinus tachycardia. Nonspecific T abnormalities, inferior leads. No significant change since April 2017.  Assessment & Plan by Problem: Principal Problem:   Hypoxia Active Problems:   Blood loss anemia   Pernicious anemia   CAD (coronary artery disease), native coronary artery   History of DVT (deep vein thrombosis)  Dana Abbott is a 50 y/o woman with past medical history significant for CHF, anemia, MI (2003), and DVT who presents with acute chest pain and shortness of  breath due to acute hypoxic respiratory failure.  Acute hypoxic respiratory failure: Venous duplex of right upper extremity showed no evidence of DVT. CT showed no evidence of pulmonary embolism or axillary abnormality and did show mildly atelectatic lung, though could not rule out infectious or inflammatory etiology. Her lack of fever, chills, and abnormal blood pressure make infection and inflammation less likely. Her most recent echocardiogram was in 11/2014 and showed EF 60-65%. She also had a stress test and myocardial  perfusion scan in 11/2014 that were unremarkable. ABG showed chronic respiratory acidosis. TSH was normal at last admission. The acute onset of pitting edema in the lower extremities, increased shortness of breath, elevated BNP, and chest pain are likely due to CHF exacerbation vs. obesity hypoventilation syndrome. Her BMI>50, HCO3>27, and PaCO2>45 make obesity hypoventilation syndrome more likely. - Echocardiogram. Consider ACE inhibitor if reduced EF. - Lasix IV 60 mg - Strict I/Os - Daily weights - Wean O2 - Telemetry - Stop antibiotics as pneumonia is unlikely  Chest pain: Right-sided chest pain. No changes on EKG from 07/2015. Low suspicion for ACS. CTA was negative for PE and aortic dissection. - Troponin level.  - PRN Tylenol  Pernicious Anemia with Iron Deficiency Anemia: Recent admission in 07/2015 for anemia requiring 2 U pRBCs. At that time she had low B12 and iron levels. Elevated intrinsic factor antibody in 07/2015. History of gastric bypass. - B12 injection 1000 mcg, daily - Ferrous sulfate 325 mg, daily  Obesity: BMI 80. Contributing to likely hypoventilation syndrome as above. - Nutrition consult - PT consult  CHF: Patient reports CHF history. Last echocardiogram in 11/2014 showed EF 60-65%. - Repeat echocardiogram to evaluate for CHF.  CAD: History of MI (2003). Has not been taking medications because of lack of insurance. - ASA 81 mg daily  - Consider  B-blocker, ACE inhibitor pending echocardiogram results  Bilateral complex cyst: Cysts found on duplex. Non-vascularized mass in right axilla 8.6 x 6.4 x 4.7 cm with a similar structure on left side, as per report.  - Dedicated axillary U/S  DVT history: after MI in 2003. 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 Patient has not been taking supplements. - Begin multivitamin - B12 and iron as above - Corrected Ca is normal  Code: Full  This is a Psychologist, occupational Note.  The care of the patient was discussed with Dr. Valentino Nose and the assessment and plan was formulated with their assistance.  Please see their note for official documentation of the patient encounter.   Signed: Lissa Morales, Med Student 09/22/2015, 3:04 PM

## 2015-09-22 NOTE — ED Notes (Signed)
Saa, phlebotomist, completed blood culture draws before abx began.

## 2015-09-22 NOTE — ED Notes (Signed)
MD at bedside. 

## 2015-09-22 NOTE — ED Notes (Signed)
Patient transported to X-ray 

## 2015-09-22 NOTE — Progress Notes (Signed)
Notified by radiologist, Dr. Roswell Nickel that pt's CT showed pectoral muscle hematoma.  Suggest Rt axilla [place] to evaluate.  Dr. Charm Rings notified.  Amanda Pea, Charity fundraiser.

## 2015-09-22 NOTE — ED Notes (Signed)
Pt arrives from home reporting CP that woke her from sleep around 0200.  Pt reports SOB, HA and new cough, reports new leg swelling bilat.  Pt denies LOC, N/V.  Cough noted.  Initial sats 88%RA, pt placed on 2L, sats now 93%.

## 2015-09-22 NOTE — ED Notes (Signed)
Pt reports taking 162 ASA at home.

## 2015-09-22 NOTE — Care Management Note (Signed)
Case Management Note  Patient Details  Name: Dana Abbott MRN: [id number] Date of Birth: [birth year]  Subjective/Objective:                  50 yo pt in ED with CP that woke her from sleep around 0200. Pt reports SOB, HA and new cough, reports new leg swelling bilat./ From home alone.  Action/Plan: Follow for disposition needs.   Expected Discharge Date:  09/25/15               Expected Discharge Plan:  Home/Self Care; Indigent [facility] Clinic  In-House Referral:  NA  Discharge planning Services  CM Consult  Post Acute Care Choice:    Choice offered to:     DME Arranged:    DME Agency:     HH Arranged:    HH Agency:     Status of Service:  In process, will continue to follow  Medicare Important Message Given:    Date Medicare IM Given:    Medicare IM give by:    Date Additional Medicare IM Given:    Additional Medicare Important Message give by:     If discussed at Long Length of Stay Meetings, dates discussed:    Additional Comments:  Oletta Cohn, RN 09/22/2015, 2:31 PM

## 2015-09-23 ENCOUNTER — Inpatient Hospital Stay (HOSPITAL_COMMUNITY): Payer: Self-pay

## 2015-09-23 ENCOUNTER — Inpatient Hospital Stay (HOSPITAL_COMMUNITY): Payer: PRIVATE HEALTH INSURANCE

## 2015-09-23 ENCOUNTER — Other Ambulatory Visit (HOSPITAL_COMMUNITY): Payer: PRIVATE HEALTH INSURANCE

## 2015-09-23 DIAGNOSIS — I509 Heart failure, unspecified: Secondary | ICD-10-CM

## 2015-09-23 LAB — CBC
HEMATOCRIT: 32.3 % — AB (ref 36.0–46.0)
HEMOGLOBIN: 8.3 g/dL — AB (ref 12.0–15.0)
MCH: 21.4 pg — ABNORMAL LOW (ref 26.0–34.0)
MCHC: 25.7 g/dL — AB (ref 30.0–36.0)
MCV: 83.5 fL (ref 78.0–100.0)
Platelets: 238 10*3/uL (ref 150–400)
RBC: 3.87 MIL/uL (ref 3.87–5.11)
RDW: 21.3 % — ABNORMAL HIGH (ref 11.5–15.5)
WBC: 4.1 10*3/uL (ref 4.0–10.5)

## 2015-09-23 LAB — BASIC METABOLIC PANEL
ANION GAP: 4 — AB (ref 5–15)
BUN: 8 mg/dL (ref 6–20)
CHLORIDE: 103 mmol/L (ref 101–111)
CO2: 32 mmol/L (ref 22–32)
Calcium: 8.1 mg/dL — ABNORMAL LOW (ref 8.9–10.3)
Creatinine, Ser: 0.69 mg/dL (ref 0.44–1.00)
GFR calc Af Amer: >60 mL/min (ref >60)
GLUCOSE: 102 mg/dL — AB (ref 65–99)
POTASSIUM: 4 mmol/L (ref 3.5–5.1)
Sodium: 139 mmol/L (ref 135–145)

## 2015-09-23 LAB — VITAMIN D 25 HYDROXY (VIT D DEFICIENCY, FRACTURES): VIT D 25 HYDROXY: 7.2 ng/mL — AB (ref 30.0–100.0)

## 2015-09-23 LAB — FOLATE: Folate: 10.2 ng/mL (ref >5.9)

## 2015-09-23 LAB — ECHOCARDIOGRAM COMPLETE
Height: 57 in
WEIGHTICAEL: 6272 [oz_av]

## 2015-09-23 MED ORDER — ACETAMINOPHEN-CODEINE #3 300-30 MG PO TABS
2.0000 | ORAL_TABLET | ORAL | Status: DC | PRN
  Administered 2015-09-23 – 2015-09-26 (×7): 2 via ORAL
  Filled 2015-09-23 (×7): qty 2

## 2015-09-23 MED ORDER — FUROSEMIDE 10 MG/ML IJ SOLN: 60.0000 mg | Freq: Two times a day (BID) | INTRAMUSCULAR | Status: DC

## 2015-09-23 MED ORDER — PERFLUTREN LIPID MICROSPHERE
1.0000 mL | INTRAVENOUS | Status: AC | PRN
  Administered 2015-09-23: 2 mL via INTRAVENOUS
  Filled 2015-09-23: qty 10

## 2015-09-23 MED ORDER — VITAMIN D (ERGOCALCIFEROL) 1.25 MG (50000 UNIT) PO CAPS
50000.0000 [IU] | ORAL_CAPSULE | ORAL | Status: DC
  Administered 2015-09-23: 50000 [IU] via ORAL
  Filled 2015-09-23: qty 1

## 2015-09-23 MED ORDER — FUROSEMIDE 10 MG/ML IJ SOLN
80.0000 mg | Freq: Two times a day (BID) | INTRAMUSCULAR | Status: DC
  Administered 2015-09-23 – 2015-09-25 (×5): 80 mg via INTRAVENOUS
  Filled 2015-09-23 (×5): qty 8

## 2015-09-23 MED ORDER — ENOXAPARIN SODIUM 40 MG/0.4ML ~~LOC~~ SOLN: 40.0000 mg | SUBCUTANEOUS | Status: DC

## 2015-09-23 MED ORDER — DIPHENHYDRAMINE HCL 25 MG PO CAPS
25.0000 mg | ORAL_CAPSULE | Freq: Once | ORAL | Status: AC
  Administered 2015-09-23: 25 mg via ORAL
  Filled 2015-09-23: qty 1

## 2015-09-23 NOTE — Progress Notes (Signed)
Pharmacist Student Provided - Patient Medication Education Education Best boy Service Patient  Education: Educated the patient on some of current medications, specifically enoxaparin. Reviewed side effects with the patient. Answered all of the patient's medication specific questions. These medications are on the patient's current med list and are subject to change upon discharge.  Delmar Landau P4 PharmD Candidate

## 2015-09-23 NOTE — Progress Notes (Signed)
  RD consulted for nutrition education and nutrition assessment regarding pernicious anemia, history of gastric bypass surgery, and noncompliance with supplements.   Body mass index is 84.8 kg/(m^2). Pt meets criteria for Morbid Obesity based on current BMI. She states that she weighed over 400 lbs prior to gastric bypass surgery in 2004 and lost down to 300 lbs after. She more recently has maintained 360 lbs. She states that she knows that it's important to take her Vitamin B12 supplement and will start up again. She does take a daily Multivitamin with minerals. Pt denies any questions and is not interested in nutrition education. RD reviewed pt's dietary recall.  Breakfast: [employer] chicken or sausage biscuit and large Marianne Sofia tea Lunch: Hot Pocket or Lunchables Dinner: Potatoes, greens, and baked chicken Snacks: fruit, jello, pudding  RD provided "Weight Loss Tips" handout from the Academy of Nutrition and Dietetics as wells as "1500 Calorie 5-day Sample Menus" and "Smart Snacking for Adults and Teens" . Emphasized the importance of hydration with calorie-free beverages and limiting sugar-sweetened beverages. Encouraged pt to seek outpatient nutrition counseling.   Recommend 500 mcg of sublingual Vitamin B12 daily, 500 mg of Calcium Citrate TID, daily Multivitamin with minerals, and 10,000 IU's of Vitamin D3 daily for 2 months.   Current diet order is Heart Healthy, patient is consuming approximately 100% of meals at this time. Labs and medications reviewed. No further nutrition interventions warranted at this time. RD contact information provided. If additional nutrition issues arise, please re-consult RD.  Dorothea Ogle RD, LDN Inpatient Clinical Dietitian Pager: [phone] After Hours Pager: [phone]

## 2015-09-23 NOTE — Progress Notes (Signed)
Pt. Requesting med for itching. On call for imts paged.

## 2015-09-23 NOTE — Progress Notes (Signed)
Subjective: Patient is feeling partially improved today. Shortness of breath is getting better. Good UOP to IV lasix with negative several liters and kgs. She continues to have pain in her right chest/shoulder area that is worsened with palpation and movement. The pain does not extend to the left side.  Objective: Vital signs in last 24 hours: Filed Vitals:   09/23/15 0039 09/23/15 0633 09/23/15 0805 09/23/15 1211  BP: 107/64 110/49 105/50 112/66  Pulse: 85 95 87 82  Temp: 98.5 F (36.9 C) 98.3 F (36.8 C) 98.6 F (37 C) 98.7 F (37.1 C)  TempSrc: Oral Oral Oral Oral  Resp: Height:      Weight:  177.81 kg (392 lb)    SpO2: 96% 94% 84% 93%   Weight change:   Intake/Output Summary (Last 24 hours) at 09/23/15 1537 Last data filed at 09/23/15 1409  Gross per 24 hour  Intake    720 ml  Output   7200 ml  Net  -6480 ml   GENERAL- morbidly obese woman lying in bed in NAD HEENT- Atraumatic, oral mucosa appears moist CARDIAC- RRR, no murmurs, rubs or gallops. RESP- Distant breath sounds, good air movement throughout EXTREMITIES- pitting pedal edema and tenderness over shins, no stasis dermatitis changes, right axilla swollen and TTP without any erythema or warmth SKIN- Warm, dry, No rash or lesion. PSYCH- Normal mood and affect, appropriate thought content and speech.  Lab Results: Basic Metabolic Panel:  Recent Labs Lab 09/22/15 1000 09/23/15 0250  NA 139 139  K 3.9 4.0  CL 103 103  CO2 29 32  GLUCOSE 105* 102*  BUN 13 8  CREATININE 0.74 0.69  CALCIUM 8.5* 8.1*   CBC:  Recent Labs Lab 09/22/15 1000 09/23/15 0250  WBC 5.3 4.1  HGB 8.9* 8.3*  HCT 33.3* 32.3*  MCV 82.0 83.5  PLT 281 238   Cardiac Enzymes:  Recent Labs Lab 09/22/15 1707  TROPONINI <0.03   Anemia Panel:  Recent Labs Lab 09/23/15 0250  FOLATE 10.2   Urine Micro Results: No results found for this or any previous visit (from the past 240 hour(s)). Studies/Results: Dg  Chest 2 View  09/22/2015  CLINICAL DATA:  Chest pain and shortness of breath beginning this morning. EXAM: CHEST  2 VIEW COMPARISON:  07/30/2015 and chest CT 07/31/2015 FINDINGS: Lungs are hypoinflated and demonstrate opacification over the left lower lobe likely pneumonia. No definite effusion. Mild prominence of the perihilar markings. Stable cardiomegaly. Remainder of the exam is unchanged. IMPRESSION: Opacification over the posterior left lower lobe likely pneumonia. Stable cardiomegaly.  Possible mild vascular congestion. Electronically Signed   By: Elberta Fortis M.D.   On: 09/22/2015 10:40   Ct Chest W Contrast  09/22/2015  ADDENDUM REPORT: 09/22/2015 17:03 ADDENDUM: I spoke with the technologist who performed the vascular ultrasound, and she suspected bilateral axillary abscesses, right larger than left. Reviewing the images, there is no axillary fluid collection. The right pectoral muscle shows some heterogenous attenuation, possibly beam hardening artifact, as well as possible mild enlargement/fullness of the lateral aspect of the right pectoralis major, compared to the left pectoral major. The possibility of a pectoral muscle hematoma or, less likely, mass, is not excluded. As I discussed with the patient's physician, Dr. Karma Greaser, at 17:00 by phone, I would suggest a bilateral axillary ultrasound, with attention to the pectoral musculature, to evaluate further. If equivocal, chest MRI could be considered. Electronically Signed   By: Edgar Frisk.D.  On: 09/22/2015 17:03  09/22/2015  CLINICAL DATA:  Further evaluation of possible axillary cysts/abscess seen on Vascular Ultrasound. EXAM: CT CHEST WITH CONTRAST TECHNIQUE: Multidetector CT imaging of the chest was performed during intravenous contrast administration. CONTRAST:  75 mL Isovue-300 COMPARISON:  07/31/2015 FINDINGS: 13 mm precarinal lymph node and 13 mm sub carinal lymph node. Mild cardiac enlargement. Minimal pericardial thickening  versus trace pericardial effusion. Left lung base is excluded from the image. No pleural effusion identified. Visualized portion of left lung clear except for areas of mild atelectasis. There is mild dependent atelectasis in the right lower lobe. Mild mosaic hazy attenuation bilaterally. There is no evidence of significant axillary adenopathy on either side. A there is no evidence of abscess or other fluid collection in either axilla. There are left dorsal thoracic varices. Images through the upper abdomen show no acute findings. IMPRESSION: No evidence of axillary abnormality on either side A few nonspecific mildly enlarged mediastinal lymph nodes are present. Mild mosaic attenuation of both lungs with no other findings to increase specificity of this finding. Alternating areas of normal and mildly atelectatic lung can cause this appearance. Early findings of various infectious or inflammatory causes are another consideration. The study is somewhat limited by patient body habitus. Electronically Signed: By: Esperanza Heir M.D. On: 09/22/2015 14:52   [place] Extrem [employer]  09/23/2015  CLINICAL DATA:  Complex area noted on venous duplex ultrasound. Patient complains of painful right axilla. EXAM: BILATERAL UPPER EXTREMITY LIMITED SOFT TISSUE ULTRASOUND TECHNIQUE: Ultrasound examination of the upper extremity soft tissues was performed in the area of clinical concern. COMPARISON:  None. FINDINGS: There is a 9.2 x 2.6 x 6.1 cm complex collection within the subcutaneous tissue of the right axilla, superficial to the axillary vessels. It demonstrates mixed echogenicity without internal increased blood flow. Similar but smaller collection is noted in the left axilla measuring 7.2 x 1.4 x 4.1 cm. IMPRESSION: Bilateral axillary complex collections, the larger one in the right axilla. These do not have the typical appearance of axillary lymphadenopathy, but rather may represent abscesses or resolving hematomas.  Electronically Signed   By: Ted Mcalpine M.D.   On: 09/23/2015 13:05   Medications: I have reviewed the patient's current medications. Scheduled Meds: . aspirin EC  81 mg Oral Daily  . cyanocobalamin  1,000 mcg Intramuscular Daily  . docusate sodium  100 mg Oral BID  . enoxaparin (LOVENOX) injection  40 mg Subcutaneous Q24H  . ferrous sulfate  325 mg Oral TID WC  . furosemide  80 mg Intravenous BID  . multivitamin  1 tablet Oral Daily  . Vitamin D (Ergocalciferol)  50,000 Units Oral Q Thu   Continuous Infusions:  PRN Meds:.acetaminophen, perflutren lipid microspheres (DEFINITY) IV suspension Assessment/Plan:  Acute Hypoxic Respiratory Failure 2/2 heart failure exacerbation: Good response to IV diuresis several liters down. Her dyspnea and hypoxia are improving with continued treatment. Most likely heart failure is the primary contributor although OHS also present. Bmet shows tolerating diuresis well, will increased dosing frequency. No s/s of infection. -Lasix 80 mg IV to BID -Strict I&O -Daily Weights -ECHO pending -Telemetry -Wean O2 to room air  Atypical Chest Pain: Patient with right sided chest pain. Sharp and stabbing in nature. Pain starts in her right arm and radiates to her right chest. EKG with no acute ischemic changes. Now findings of cyst vs ?hematoma or other fluid collection at the right axilla. Likely relating to her continued pain at this location. -Consider focused  MRI vs needle aspiration of fluid collection  CAD s/p MI (2003), unspecified heart failure: History of CAD with MI in 2003. She reports not taking any medications at home due to lack of insurance and being unable to afford medications. Denies any history of stents or CABG. Last echocardiogram in 11/2014 showed EF 60-65%. -ASA 81 mg daily -New TTE today -consider BB once acute CHF exacerbation ruled out -consider starting ACE-I and high intensity statin  Pernicious Anemia and Iron Deficiency Anemia:  Recent admission in 07/2015 for anemia requiring 2 U pRBCs. At that time she had low B12 and ferritin of 4. Elevated intrinsic factor antibody in 07/2015. History of gastric bypass. Reports not taking B12 at home due to lack of funds. Reports taking Fe supplements every other day at home. -Nutrition consult -B12 injection 1000 mcg, daily -Ferrous sulfate 325 mg, daily  FULL CODE Diet: Regular VTE ppx: Fernando Salinas enoxaparin  Dispo: Disposition is deferred at this time, awaiting improvement of current medical problems.   The patient does have a current PCP [facility]) and does need an [facility] hospital follow-up appointment after discharge.  The patient does not have transportation limitations that hinder transportation to clinic appointments.   LOS: 1 day   Fuller Plan, MD 09/23/2015, 3:37 PM

## 2015-09-23 NOTE — Discharge Instructions (Signed)
Nutritional Supplement Recommendations per Dietitian: 500 mcg of sublingual Vitamin B12 daily 500 mg of Calcium Citrate 3 times daily Multivitamin with minerals daily 10,000 IU's of Vitamin D3 daily for 2 months and 1,000 IU's daily after 2 months

## 2015-09-23 NOTE — Evaluation (Signed)
Physical Therapy Evaluation Patient Details Name: Dana Abbott MRN: [id number] DOB: 1965-12-02 Today's Date: 09/23/2015   History of Present Illness  Pt is a 50 y/o F presenting for acute hypoxic respiratory failure (CHF exacerbation vs. obesity hypoventilation).  Pt's PMH includes severe obesity s/p gastric bypass, CAD s/p MI, pernicious anemia, iron deficiency anemia, DVT, CHF.      Clinical Impression  Pt admitted with above diagnosis. Pt currently with functional limitations due to the deficits listed below (see PT Problem List). Ms. Hower was at mod I level of mobility before onset of symptoms, using her rollator for household ambulation.  She currently requires min assist for supine>sit due to Rt knee pain (chronic), and max +2 assist for return to supine.  She is very eager and motivated to return to PLOF and would benefit from intensive therapies if she is able to progress to OOB. Pt will benefit from skilled PT to increase their independence and safety with mobility to allow discharge to the venue listed below.      Follow Up Recommendations CIR    Equipment Recommendations  Other (comment) (TBD)    Recommendations for Other Services OT consult;Rehab consult     Precautions / Restrictions Precautions Precautions: Fall Restrictions Weight Bearing Restrictions: No      Mobility  Bed Mobility Overal bed mobility: +2 for physical assistance;Needs Assistance Bed Mobility: Supine to Sit;Sit to Supine     Supine to sit: Min assist;HOB elevated Sit to supine: Max assist;+2 for safety/equipment;+2 for physical assistance   General bed mobility comments: Assist moving Rt LE off EOB w/ increased time and step by step cues.  Pt w/ increased difficulty and anxiety w/ returning to supine and requires max assist managing Bil LEs and supporting/directing trunk.  Transfers                 General transfer comment: Pt attempted sit>stand but w/ buttocks barely off bed, pt  reported her Rt knee felt like "it was going to give out" and instructed pt to not attempt farther.    Ambulation/Gait                Stairs            Wheelchair Mobility    Modified Rankin (Stroke Patients Only)       Balance Overall balance assessment: Needs assistance;History of Falls Sitting-balance support: Bilateral upper extremity supported;Feet supported Sitting balance-Leahy Scale: Poor Sitting balance - Comments: Bil UE support to maintain balance sitting EOB                                     Pertinent Vitals/Pain Pain Assessment: 0-10 Pain Score: 10-Worst pain ever Pain Location: Rt knee (chronic from arthritis) Pain Descriptors / Indicators: Aching;Constant Pain Intervention(s): Limited activity within patient's tolerance;Monitored during session;Patient requesting pain meds-RN notified    Home Living Family/patient expects to be discharged to:: Private residence Living Arrangements: Children;Other relatives (daughter and grandson) Available Help at Discharge: Family;Available PRN/intermittently (daughter goes to work during the days) Type of Home: Apartment Home Access: Level entry     Home Layout: One level Home Equipment: Grab bars - toilet;Grab bars - tub/shower;Walker - 4 wheels;Cane - single point      Prior Function Level of Independence: Independent with assistive device(s)         Comments: Uses RW to ambulate household distances, uses RW due to arthritis  pain in Rt knee. Uses scooter in grocery store.  For tub/shower transfer she sits on side of tub, turns and then stands in tub/shower.  Had two falls in the past 6 months from losing her footing.     Hand Dominance   Dominant Hand: Left    Extremity/Trunk Assessment   Upper Extremity Assessment: Defer to OT evaluation           Lower Extremity Assessment: RLE deficits/detail;LLE deficits/detail RLE Deficits / Details: limited assessment due to Rt knee  pain.  Swelling Bil LEs.  Functionally, pt requires assist to mobilize Rt LE w/ bed mobility. LLE Deficits / Details: Not formally assessed.  Functionally, pt able to move Lt LE at mod I for supine>sit but requires assist against gravity w/ return to supine.     Communication   Communication: No difficulties  Cognition Arousal/Alertness: Awake/alert Behavior During Therapy: Anxious;WFL for tasks assessed/performed Overall Cognitive Status: Within Functional Limits for tasks assessed                      General Comments General comments (skin integrity, edema, etc.): Once pt back in bed noted that the part of the controller that allows the [facility] to raise was not working.  Pt placed in reverse trendelenberg w/ her LEs elevated to simulate HOB elevated.  Nurse tech made appropriate call for this to be addressed.  Pt provided w/ extra pillows and reported she was comfortable resting in bed at end of session.  RN made aware of situation as well.    Exercises        Assessment/Plan    PT Assessment Patient needs continued PT services  PT Diagnosis Difficulty walking;Generalized weakness;Acute pain   PT Problem List Decreased strength;Decreased range of motion;Decreased activity tolerance;Decreased balance;Decreased mobility;Decreased knowledge of use of DME;Decreased safety awareness;Cardiopulmonary status limiting activity;Pain;Obesity  PT Treatment Interventions DME instruction;Gait training;Functional mobility training;Therapeutic activities;Therapeutic exercise;Balance training;Patient/family education;Modalities   PT Goals (Current goals can be found in the Care Plan section) Acute Rehab PT Goals Patient Stated Goal: to get stronger and to regain her independence before returning home PT Goal Formulation: With patient/family Time For Goal Achievement: 10/07/15 Potential to Achieve Goals: Good    Frequency Min 3X/week   Barriers to discharge Decreased caregiver support At  home alone during the day    Co-evaluation               End of Session   Activity Tolerance: Patient limited by fatigue;Patient limited by pain Patient left: in bed;with call bell/phone within reach;with family/visitor present;with nursing/sitter in room Nurse Communication: Mobility status;Other (comment);Need for lift equipment (see general comments)         Time: 1610-9604 PT Time Calculation (min) (ACUTE ONLY): 39 min   Charges:   PT Evaluation $PT Eval Moderate Complexity: 1 Procedure PT Treatments $Therapeutic Activity: 23-37 mins   PT G Codes:       Encarnacion Chu PT, DPT  Pager: [phone] Phone: [phone] 09/23/2015, 4:39 PM

## 2015-09-23 NOTE — Progress Notes (Signed)
Subjective: Dana Abbott is a 50 y/o woman with past medical history significant for severe obesity (BMI 80) s/p gastric bypass, CAD s/p MI (2003), pernicious anemia and iron deficiency anemia, DVT, and CHF (EF 60-65%) presenting for acute hypoxic respiratory failure. There were no events overnight. She slept well but is still in pain. She says the pain medications are helping.  Objective: Vital signs in last 24 hours: Filed Vitals:   09/22/15 2130 09/23/15 0039 09/23/15 0633 09/23/15 0805  BP: 110/59 107/64 110/49 105/50  Pulse: 84 85 95 87  Temp: 98.7 F (37.1 C) 98.5 F (36.9 C) 98.3 F (36.8 C) 98.6 F (37 C)  TempSrc: Oral Oral Oral Oral  Resp: 18 20 18 18   Height:      Weight:   177.81 kg (392 lb)   SpO2: 95% 96% 94% 84%   Weight change:  -3 kg  Intake/Output Summary (Last 24 hours) at 09/23/15 1034 Last data filed at 09/23/15 1026  Gross per 24 hour  Intake    720 ml  Output   4000 ml  Net  -3280 ml   Physical Exam: General: Laying in bed. In no apparent distress. CV: RRR. Normal S1/S2. No m/r/g. JVD appreciated Pulm: CTAB with basilar cracklers. Abdomen: Soft, nontender, nondistended. Positive bowel sounds. Ext: 2+ pitting edema in bilateral lower extremities.   Lab Results: Basic Metabolic Panel:  Recent Labs  16/10/96 1000 09/23/15 0250  NA 139 139  K 3.9 4.0  CL 103 103  CO2 29 32  GLUCOSE 105* 102*  BUN 13 8  CREATININE 0.74 0.69  CALCIUM 8.5* 8.1*   CBC:  Recent Labs  09/22/15 1000 09/23/15 0250  WBC 5.3 4.1  HGB 8.9* 8.3*  HCT 33.3* 32.3*  MCV 82.0 83.5  PLT 281 238   Cardiac Enzymes:  Recent Labs  09/22/15 1707  TROPONINI <0.03    Recent Labs  09/23/15 0250  FOLATE 10.2   Studies/Results: Dg Chest 2 View  09/22/2015  CLINICAL DATA:  Chest pain and shortness of breath beginning this morning. EXAM: CHEST  2 VIEW COMPARISON:  07/30/2015 and chest CT 07/31/2015 FINDINGS: Lungs are hypoinflated and demonstrate opacification over  the left lower lobe likely pneumonia. No definite effusion. Mild prominence of the perihilar markings. Stable cardiomegaly. Remainder of the exam is unchanged. IMPRESSION: Opacification over the posterior left lower lobe likely pneumonia. Stable cardiomegaly.  Possible mild vascular congestion. Electronically Signed   By: Elberta Fortis M.D.   On: 09/22/2015 10:40   Ct Chest W Contrast  09/22/2015  ADDENDUM REPORT: 09/22/2015 17:03 ADDENDUM: I spoke with the technologist who performed the vascular ultrasound, and she suspected bilateral axillary abscesses, right larger than left. Reviewing the images, there is no axillary fluid collection. The right pectoral muscle shows some heterogenous attenuation, possibly beam hardening artifact, as well as possible mild enlargement/fullness of the lateral aspect of the right pectoralis major, compared to the left pectoral major. The possibility of a pectoral muscle hematoma or, less likely, mass, is not excluded. As I discussed with the patient's physician, Dr. Karma Greaser, at 17:00 by phone, I would suggest a bilateral axillary ultrasound, with attention to the pectoral musculature, to evaluate further. If equivocal, chest MRI could be considered. Electronically Signed   By: Esperanza Heir M.D.   On: 09/22/2015 17:03  09/22/2015  CLINICAL DATA:  Further evaluation of possible axillary cysts/abscess seen on Vascular Ultrasound. EXAM: CT CHEST WITH CONTRAST TECHNIQUE: Multidetector CT imaging of the chest was performed during  intravenous contrast administration. CONTRAST:  75 mL Isovue-300 COMPARISON:  07/31/2015 FINDINGS: 13 mm precarinal lymph node and 13 mm sub carinal lymph node. Mild cardiac enlargement. Minimal pericardial thickening versus trace pericardial effusion. Left lung base is excluded from the image. No pleural effusion identified. Visualized portion of left lung clear except for areas of mild atelectasis. There is mild dependent atelectasis in the right lower  lobe. Mild mosaic hazy attenuation bilaterally. There is no evidence of significant axillary adenopathy on either side. A there is no evidence of abscess or other fluid collection in either axilla. There are left dorsal thoracic varices. Images through the upper abdomen show no acute findings. IMPRESSION: No evidence of axillary abnormality on either side A few nonspecific mildly enlarged mediastinal lymph nodes are present. Mild mosaic attenuation of both lungs with no other findings to increase specificity of this finding. Alternating areas of normal and mildly atelectatic lung can cause this appearance. Early findings of various infectious or inflammatory causes are another consideration. The study is somewhat limited by patient body habitus. Electronically Signed: By: Esperanza Heir M.D. On: 09/22/2015 14:52   Medications: Scheduled Meds: . aspirin EC  81 mg Oral Daily  . cyanocobalamin  1,000 mcg Intramuscular Daily  . docusate sodium  100 mg Oral BID  . enoxaparin (LOVENOX) injection  40 mg Subcutaneous Q24H  . ferrous sulfate  325 mg Oral TID WC  . furosemide  80 mg Intravenous BID  . multivitamin  1 tablet Oral Daily  . Vitamin D (Ergocalciferol)  50,000 Units Oral Q Thu   Continuous Infusions:  PRN Meds:.acetaminophen Assessment/Plan: Principal Problem:   Hypoxia Active Problems:   Blood loss anemia   Pernicious anemia   CAD (coronary artery disease), native coronary artery   History of DVT (deep vein thrombosis)   Respiratory failure, acute (HCC)   CHF (congestive heart failure) (HCC)  Ms. Sniff is a 50 y/o woman with severe obesity, CAD, CHF, and anemia presenting for acute hypoxic respiratory failure.  Acute hypoxic respiratory failure: Venous duplex of right upper extremity and CT are unconcerning for DVT, PE, and aortic dissection. CXR showed no pneumothorax, and lack of constitutional systems makes pneumonia unlikely. Negative troponin and unchanged EKG make acute  coronary syndrome and tamponade low suspicion. Her history of CHF with EF 60-65%, elevated BNP, and fluid weight gain raise concern for CHF exacerbation. Her severe obesity (BMI 80), elevated HCO3 and PaCO2 make obesity hypoventilation syndrome more likely. - Echocardiogram today. If reduced EF suggestive of CHF, start ACE inhibitor. - Lasix IV 80 mg, BID; increased from yesterday's IV 60 mg for adequate diuresis - Strict I/Os - Daily weights - Wean O2 - Telemetry  Chest pain: No changes from EKG in 07/2015. Right-sided chest pain. - PRN Tylenol  Pernicious anemia with iron deficiency anemia: Admitted in 07/2015 for anemia, which required 2 U pRBCs. She had low B12 and iron levels and elevated intrinsic factor antibodies. - B12 injection 1000 mcg, daily - Ferrous sulfate 325 mg, daily  Obesity: BMI 80. Likely obesity hypoventilation syndrome. - Nutrition consult - PT consult  CHF: Reported CHF (EF 60-65%). Last echocardiogram in 11/2014. Pitting edema and increased weight from dry weight suggestive of volume overload. Lost 4 kg overnight from Lasix administration. - Increase to Lasix IV 80 mg as above - Echocardiogram as above  CAD: s/p MI (2003). Has not been taking her medications because of lack of insurance. Negative troponin and unchanged EKG make ACS unlikely. - ASA 81  mg, daily - Consider B-blocker and ACE inhibitor depending on echocardiogram results. No B-blocker if CHF.  Bilateral complex cyst: Venous duplex identified bilateral cysts in axillae. 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 Patient has not been taking supplements. - Begin multivitamin - B12 and iron as above  This is a Psychologist, occupational Note.  The care of the patient was discussed with Dr. Sheliah Hatch and the assessment and plan formulated with their assistance.  Please see their attached note for official  documentation of the daily encounter.   LOS: 1 day   Lissa Morales, Med Student 09/23/2015, 10:34 AM

## 2015-09-23 NOTE — Progress Notes (Signed)
  Echocardiogram 2D Echocardiogram has been performed.  Arvil Chaco 09/23/2015, 3:26 PM

## 2015-09-24 DIAGNOSIS — I272 Other secondary pulmonary hypertension: Secondary | ICD-10-CM

## 2015-09-24 LAB — BASIC METABOLIC PANEL
ANION GAP: 7 (ref 5–15)
BUN: 9 mg/dL (ref 6–20)
CHLORIDE: 100 mmol/L — AB (ref 101–111)
CO2: 34 mmol/L — AB (ref 22–32)
Calcium: 7.8 mg/dL — ABNORMAL LOW (ref 8.9–10.3)
Creatinine, Ser: 0.73 mg/dL (ref 0.44–1.00)
GFR calc non Af Amer: >60 mL/min (ref >60)
Glucose, Bld: 99 mg/dL (ref 65–99)
POTASSIUM: 3.5 mmol/L (ref 3.5–5.1)
Sodium: 141 mmol/L (ref 135–145)

## 2015-09-24 LAB — APTT: APTT: 25 s (ref 24–37)

## 2015-09-24 LAB — PROTIME-INR
INR: 1.18 (ref 0.00–1.49)
PROTHROMBIN TIME: 15.2 s (ref 11.6–15.2)

## 2015-09-24 MED ORDER — ENOXAPARIN SODIUM 80 MG/0.8ML ~~LOC~~ SOLN
80.0000 mg | SUBCUTANEOUS | Status: DC
  Administered 2015-09-24 – 2015-09-27 (×4): 80 mg via SUBCUTANEOUS
  Filled 2015-09-24 (×5): qty 0.8

## 2015-09-24 MED ORDER — ACETAMINOPHEN 325 MG PO TABS
650.0000 mg | ORAL_TABLET | Freq: Once | ORAL | Status: AC
  Administered 2015-09-24: 650 mg via ORAL
  Filled 2015-09-24: qty 2

## 2015-09-24 MED ORDER — ENOXAPARIN SODIUM 80 MG/0.8ML ~~LOC~~ SOLN: 80.0000 mg | SUBCUTANEOUS | Status: DC

## 2015-09-24 MED ORDER — ACETAMINOPHEN 325 MG PO TABS: 650.0000 mg | ORAL_TABLET | Freq: Four times a day (QID) | ORAL | Status: DC | PRN

## 2015-09-24 NOTE — NC FL2 (Signed)
[place] MEDICAID [id number] LEVEL OF CARE SCREENING TOOL     IDENTIFICATION  Patient Name: Dana Abbott Birthdate: Apr 04, 1966 Sex: female Admission Date (Current Location): 09/22/2015  [facility] and [place] Number:  Producer, television/film/video and Address:  [facility] [place], [address], [place], [place] [address]      Provider Number: [id number]  Attending Physician Name and Address:  Inez Catalina, MD  Relative Name and Phone Number:       Current Level of Care: Hospital Recommended Level of Care: Skilled Nursing Facility Prior Approval Number:    Date Approved/Denied:   PASRR Number: [id number]  Discharge Plan: SNF    Current Diagnoses: Patient Active Problem List   Diagnosis Date Noted  . Pernicious anemia 09/22/2015  . CAD (coronary artery disease), native coronary artery 09/22/2015  . History of DVT (deep vein thrombosis) 09/22/2015  . Respiratory failure, acute (HCC) 09/22/2015  . CHF (congestive heart failure) (HCC) 09/22/2015  . Dyspnea 07/31/2015  . Acute on chronic diastolic heart failure (HCC) 07/30/2015  . Hypoxia 07/30/2015  . Blood loss anemia 07/30/2015    Orientation RESPIRATION BLADDER Height & Weight     Self, Time, Situation, Place  O2 (Nasal Canula 1.5 L) Continent, Indwelling catheter Weight: (!) 378 lb (171.46 kg) (bedscale ) Height:  4\' 9"  (144.8 cm)  BEHAVIORAL SYMPTOMS/MOOD NEUROLOGICAL BOWEL NUTRITION STATUS   (None)  (None) Incontinent Diet (NPO except sips with meds)  AMBULATORY STATUS COMMUNICATION OF NEEDS Skin   Extensive Assist Verbally Normal                       Personal Care Assistance Level of Assistance  Bathing, Feeding, Dressing Bathing Assistance: Limited assistance Feeding assistance: Limited assistance Dressing Assistance: Limited assistance     Functional Limitations Info  Sight, Hearing, Speech Sight Info: Adequate Hearing Info: Adequate Speech Info: Adequate    SPECIAL CARE FACTORS  FREQUENCY  PT (By licensed PT)     PT Frequency: 5 x week              Contractures Contractures Info: Not present    Additional Factors Info  Code Status, Allergies Code Status Info: Full Allergies Info: Shellfish           Current Medications (09/24/2015):  This is the current hospital active medication list Current Facility-Administered Medications  Medication Dose Route Frequency Provider Last Rate Last Dose  . acetaminophen-codeine (TYLENOL #3) 300-30 MG per tablet 2 tablet  2 tablet Oral Q4H PRN Fuller Plan, MD   2 tablet at 09/23/15 2212  . aspirin EC tablet 81 mg  81 mg Oral Daily Denton Brick, MD   81 mg at 09/23/15 1038  . cyanocobalamin ((VITAMIN B-12)) injection 1,000 mcg  1,000 mcg Intramuscular Daily Denton Brick, MD   1,000 mcg at 09/24/15 1059  . docusate sodium (COLACE) capsule 100 mg  100 mg Oral BID Denton Brick, MD   100 mg at 09/23/15 2212  . enoxaparin (LOVENOX) injection 80 mg  80 mg Subcutaneous Q24H Ancil Boozer, RPH      . ferrous sulfate tablet 325 mg  325 mg Oral TID WC Denton Brick, MD   325 mg at 09/23/15 1733  . furosemide (LASIX) injection 80 mg  80 mg Intravenous BID Denton Brick, MD   80 mg at 09/24/15 1056  . multivitamin (PROSIGHT) tablet 1 tablet  1 tablet Oral Daily Denton Brick,  MD   1 tablet at 09/23/15 1038  . Vitamin D (Ergocalciferol) (DRISDOL) capsule 50,000 Units  50,000 Units Oral Q Thu Denton Brick, MD   50,000 Units at 09/23/15 1302     Discharge Medications: Please see discharge summary for a list of discharge medications.  Relevant Imaging Results:  Relevant Lab Results:   Additional Information SS#: [id number]  Margarito Liner, LCSW

## 2015-09-24 NOTE — Clinical Social Work Placement (Signed)
   CLINICAL SOCIAL WORK PLACEMENT  NOTE  Date:  09/24/2015  Patient Details  Name: Amaryah Bachmeier MRN: [id number] Date of Birth: 1965-12-17  Clinical Social Work is seeking post-discharge placement for this patient at the   level of care (*CSW will initial, date and re-position this form in  chart as items are completed):  Yes   Patient/family provided with [facility] Clinical Social Work Department's list of facilities offering this level of care within the geographic area requested by the patient (or if unable, by the patient's family).  Yes   Patient/family informed of their freedom to choose among providers that offer the needed level of care, that participate in Medicare, Medicaid or managed care program needed by the patient, have an available bed and are willing to accept the patient.  Yes   Patient/family informed of [facility]'s ownership interest in [facility] and [facility], as well as of the fact that they are under no obligation to receive care at these facilities.  PASRR submitted to EDS on 09/24/15     PASRR number received on 09/24/15     Existing PASRR number confirmed on       FL2 transmitted to all facilities in geographic area requested by pt/family on       FL2 transmitted to all facilities within larger geographic area on       Patient informed that his/her managed care company has contracts with or will negotiate with certain facilities, including the following:            Patient/family informed of bed offers received.  Patient chooses bed at       Physician recommends and patient chooses bed at      Patient to be transferred to   on  .  Patient to be transferred to facility by       Patient family notified on   of transfer.  Name of family member notified:        PHYSICIAN       Additional Comment:    _______________________________________________ Margarito Liner, LCSW 09/24/2015, 12:13 PM

## 2015-09-24 NOTE — Clinical Social Work Note (Signed)
Clinical Social Work Assessment  Patient Details  Name: Dana Abbott MRN: [id number] Date of Birth: 02-08-1966  Date of referral:  09/24/15               Reason for consult:  Facility Placement, Discharge Planning                Permission sought to share information with:  Facility Sport and exercise psychologist, Family Supports Permission granted to share information::  Yes, Verbal Permission Granted  Name::     Music therapist::  SNF's  Relationship::  Mother  Contact Information:  [phone]  Housing/Transportation Living arrangements for the past 2 months:  Apartment Source of Information:  Patient, Medical Team Patient Interpreter Needed:  None Criminal Activity/Legal Involvement Pertinent to Current Situation/Hospitalization:  No - Comment as needed Significant Relationships:  Adult Children, Other Family Members, Parents Lives with:  Adult Children, Other (Comment) (Grandson) Do you feel safe going back to the place where you live?  Yes Need for family participation in patient care:  Yes (Comment)  Care giving concerns:  PT recommending CIR. CSW facilitating SNF backup.   Social Worker assessment / plan:  CSW met with patient. No supports at bedside. CSW introduced role and explained that discharge planning would be discussed. CSW confirmed CIR recommendation by PT. Explained SNF backup process. Patient agreeable to SNF backup and gave verbal permission to fax her information to facilities in the [facility] area. No further concerns. CSW encouraged patient to contact CSW as needed. CSW will continue to follow patient for support and facilitate discharge to SNF if needed once medically stable. Barrier: No insurance.  Employment status:  [employer] information:  Self Pay (Medicaid Pending) PT Recommendations:  [place] / Referral to community resources:  [place]  Patient/Family's Response to care:  Patient agreeable to  SNF backup. Patient has family that are supportive and involved in patient's care. Patient polite and appreciated social work intervention.  Patient/Family's Understanding of and Emotional Response to Diagnosis, Current Treatment, and Prognosis:  Patient knowledgeable of medical interventions and aware of possible SNF placement once medically stable for discharge.  Emotional Assessment Appearance:  Appears stated age Attitude/Demeanor/Rapport:   (Pleasant) Affect (typically observed):  Accepting, Appropriate, Calm, Pleasant Orientation:  Oriented to Self, Oriented to Place, Oriented to  Time, Oriented to Situation Alcohol / Substance use:  Never Used Psych involvement (Current and /or in the community):  No (Comment)  Discharge Needs  Concerns to be addressed:  Care Coordination Readmission within the last 30 days:  No Current discharge risk:  Dependent with Mobility, Other (Home alone during the day) Barriers to Discharge:  Inadequate or no insurance   Candie Chroman, LCSW 09/24/2015, 12:09 PM

## 2015-09-24 NOTE — Progress Notes (Signed)
Subjective: Continues to have good UOP and improvement in dyspnea and chest pain with diuresis. She slept well without events. Right shoulder pain continues to improve. Underwent echocardiogram yesterday showing RV dilation and PA HTN. She continues to require supplemental oxygen to maintain O2 90%. She has not yet walked to participate in physical therapy.  Objective: Vital signs in last 24 hours: Filed Vitals:   09/23/15 1628 09/23/15 2036 09/24/15 0042 09/24/15 0622  BP: 102/65 108/61 115/57 122/42  Pulse: 84 93 105 95  Temp: 98.9 F (37.2 C) 98.6 F (37 C) 98.5 F (36.9 C) 98.4 F (36.9 C)  TempSrc: Oral Oral Oral Oral  Resp: 18 18 18 18   Height:      Weight:    378 lb (171.46 kg)  SpO2: 89% 95% 91% 92%   Weight change: 12 lb (5.443 kg)  Intake/Output Summary (Last 24 hours) at 09/24/15 1124 Last data filed at 09/24/15 4098  Gross per 24 hour  Intake    240 ml  Output   6250 ml  Net  -6010 ml   GENERAL- morbidly obese woman lying in bed in NAD HEENT- Atraumatic, oral mucosa appears moist CARDIAC- RRR, no murmurs, rubs or gallops RESP- Distant breath sounds, good air movement throughout EXTREMITIES- 1+ pedal edema and improving tenderness over shins, no stasis dermatitis changes, right axilla swollen and TTP without any erythema or warmth SKIN- Warm, dry, No rash or lesion PSYCH- Normal mood and affect, appropriate thought content and speech  Lab Results: Basic Metabolic Panel:  Recent Labs Lab 09/23/15 0250 09/24/15 0315  NA 139 141  K 4.0 3.5  CL 103 100*  CO2 32 34*  GLUCOSE 102* 99  BUN 8 9  CREATININE 0.69 0.73  CALCIUM 8.1* 7.8*   CBC:  Recent Labs Lab 09/22/15 1000 09/23/15 0250  WBC 5.3 4.1  HGB 8.9* 8.3*  HCT 33.3* 32.3*  MCV 82.0 83.5  PLT 281 238   Cardiac Enzymes:  Recent Labs Lab 09/22/15 1707  TROPONINI <0.03   Anemia Panel:  Recent Labs Lab 09/23/15 0250  FOLATE 10.2   Urine Micro Results: Recent Results (from the  past 240 hour(s))  Blood culture (routine x 2)     Status: None (Preliminary result)   Collection Time: 09/22/15  1:15 PM  Result Value Ref Range Status   Specimen Description BLOOD RIGHT ANTECUBITAL  Final   Special Requests BOTTLES DRAWN AEROBIC ONLY 10CC  Final   Culture NO GROWTH 1 DAY  Final   Report Status PENDING  Incomplete  Blood culture (routine x 2)     Status: None (Preliminary result)   Collection Time: 09/22/15  1:21 PM  Result Value Ref Range Status   Specimen Description BLOOD RIGHT HAND  Final   Special Requests IN PEDIATRIC BOTTLE 4CC  Final   Culture NO GROWTH 1 DAY  Final   Report Status PENDING  Incomplete   Studies/Results: Ct Chest W Contrast  09/22/2015  ADDENDUM REPORT: 09/22/2015 17:03 ADDENDUM: I spoke with the technologist who performed the vascular ultrasound, and she suspected bilateral axillary abscesses, right larger than left. Reviewing the images, there is no axillary fluid collection. The right pectoral muscle shows some heterogenous attenuation, possibly beam hardening artifact, as well as possible mild enlargement/fullness of the lateral aspect of the right pectoralis major, compared to the left pectoral major. The possibility of a pectoral muscle hematoma or, less likely, mass, is not excluded. As I discussed with the patient's physician, Dr. Karma Greaser,  at 17:00 by phone, I would suggest a bilateral axillary ultrasound, with attention to the pectoral musculature, to evaluate further. If equivocal, chest MRI could be considered. Electronically Signed   By: Esperanza Heir M.D.   On: 09/22/2015 17:03  09/22/2015  CLINICAL DATA:  Further evaluation of possible axillary cysts/abscess seen on Vascular Ultrasound. EXAM: CT CHEST WITH CONTRAST TECHNIQUE: Multidetector CT imaging of the chest was performed during intravenous contrast administration. CONTRAST:  75 mL Isovue-300 COMPARISON:  07/31/2015 FINDINGS: 13 mm precarinal lymph node and 13 mm sub carinal lymph  node. Mild cardiac enlargement. Minimal pericardial thickening versus trace pericardial effusion. Left lung base is excluded from the image. No pleural effusion identified. Visualized portion of left lung clear except for areas of mild atelectasis. There is mild dependent atelectasis in the right lower lobe. Mild mosaic hazy attenuation bilaterally. There is no evidence of significant axillary adenopathy on either side. A there is no evidence of abscess or other fluid collection in either axilla. There are left dorsal thoracic varices. Images through the upper abdomen show no acute findings. IMPRESSION: No evidence of axillary abnormality on either side A few nonspecific mildly enlarged mediastinal lymph nodes are present. Mild mosaic attenuation of both lungs with no other findings to increase specificity of this finding. Alternating areas of normal and mildly atelectatic lung can cause this appearance. Early findings of various infectious or inflammatory causes are another consideration. The study is somewhat limited by patient body habitus. Electronically Signed: By: Esperanza Heir M.D. On: 09/22/2015 14:52   [place] [employer]  09/23/2015  CLINICAL DATA:  Complex area noted on venous duplex ultrasound. Patient complains of painful right axilla. EXAM: BILATERAL UPPER EXTREMITY LIMITED SOFT TISSUE ULTRASOUND TECHNIQUE: Ultrasound examination of the upper extremity soft tissues was performed in the area of clinical concern. COMPARISON:  None. FINDINGS: There is a 9.2 x 2.6 x 6.1 cm complex collection within the subcutaneous tissue of the right axilla, superficial to the axillary vessels. It demonstrates mixed echogenicity without internal increased blood flow. Similar but smaller collection is noted in the left axilla measuring 7.2 x 1.4 x 4.1 cm. IMPRESSION: Bilateral axillary complex collections, the larger one in the right axilla. These do not have the typical appearance of axillary lymphadenopathy, but  rather may represent abscesses or resolving hematomas. Electronically Signed   By: Ted Mcalpine M.D.   On: 09/23/2015 13:05   Medications: I have reviewed the patient's current medications. Scheduled Meds: . aspirin EC  81 mg Oral Daily  . cyanocobalamin  1,000 mcg Intramuscular Daily  . docusate sodium  100 mg Oral BID  . enoxaparin (LOVENOX) injection  80 mg Subcutaneous Q24H  . ferrous sulfate  325 mg Oral TID WC  . furosemide  80 mg Intravenous BID  . multivitamin  1 tablet Oral Daily  . Vitamin D (Ergocalciferol)  50,000 Units Oral Q Thu   Continuous Infusions:  PRN Meds:.acetaminophen-codeine Assessment/Plan:  Acute Hypoxic Respiratory Failure 2/2 heart failure exacerbation: Good response to IV diuresis several liters down. Her dyspnea and hypoxia are improving with continued treatment. She reports having a formal sleep study 3 years ago and used CPAP for a while but was told her problem was not apneic episodes. Given her chronic respiratory acidosis with compensatory metabolic alkalosis, I suspect OHS to be a larger component underlying this process. -Lasix 80 mg IV BID, likely transition to PO tmrw -Daily Weights -Telemetry -Wean O2 to room air -qAM Bmet -Start qHS BIPAP  Atypical Chest Pain: discussed R axillary fluid collection with interventional radiology service, who reviewed imaging and feel this is likely soft tissue edema. In the absence of inflammatory or infectious symptoms very low suspicion for abscess. Bilateral findings also support this consideration. Given that pain is partially improving today with continued diuresis plan will be observation only at this time without aspiration unless new findings or failure to improve Appreciate IR evaluation of these findings  CAD s/p MI (2003), unspecified heart failure: History of CAD with MI in 2003. New TTE 09/23/15 showing LVEF of 55%, significant right ventricle and atrium enlargement with estimated max PA pressure of  . 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 She does have a history of past DVT and chronic pulmonary embolic disease would be a less likely alternative explanation. Chest CT was negative for other pulmonary disease processes. -ASA 81 mg daily -Plan to start lisinopril  by tomorrow if blood pressure and renal function tolerating  Pernicious Anemia and Iron Deficiency Anemia: Recent admission in 07/2015 for anemia requiring 2 U pRBCs. At that time she had low B12 and ferritin of 4. Elevated intrinsic factor antibody in 07/2015. History of gastric bypass. Reports not taking B12 at home due to lack of funds. Reports taking iron supplements every other day at home. She was not interested in continued nutrition counseling, consult and assessment appreciated. -B12 injection 1000 mcg, daily -Ferrous sulfate 325 mg, daily  FULL CODE Diet: Regular VTE ppx: [place] enoxaparin  Dispo: Disposition is deferred at this time, awaiting improvement of current medical problems. With continued clinic improvement will likely be appropriate for discharge in 1-2 days.  The patient does have a current PCP [facility]) and does need an [facility] hospital follow-up appointment after discharge.  The patient does not have transportation limitations that hinder transportation to clinic appointments.   LOS: 2 days   Fuller Plan, MD 09/24/2015, 11:24 AM

## 2015-09-24 NOTE — Progress Notes (Signed)
Patient ID: Dana Abbott, female   DOB: November 18, 1965, 50 y.o.   MRN: [id number]   Request made for aspiration of Rt axillary fluid collection per TS MD Collection found incidentally on Vascular study for upper extremity swelling/poss clot [place] 6/1: IMPRESSION: Bilateral axillary complex collections, the larger one in the right axilla. These do not have the typical appearance of axillary lymphadenopathy, but rather may represent abscesses or resolving Hematomas.  Pt did have noted Rt arm and chest wall pain---now resolving per MD  Imaging has been reviewed by Dr Loreta Ave Most likely edema  Pt is afebrile Wbc wnl  Discussed with ordering MD- Dr Dimple Casey Since pt pain is resolving and shows no signs of infection (no redness; no warmth of skin)  Will cancel request for now Re image and re visit if needed

## 2015-09-24 NOTE — Progress Notes (Signed)
Rehab Admissions Coordinator Note:  Patient was screened by Dana Abbott for appropriateness for an Inpatient Acute Rehab Consult per PT recommendation. At this time, we are recommending await further progres with therpay before determining rehab venue. Pt not at a level yet to be able to toelrate 3 hrs per day of therapy. Unable to stand yet. I will follow. Dana Abbott 09/24/2015, 7:29 AM  I can be reached at [phone].

## 2015-09-24 NOTE — Progress Notes (Signed)
Subjective: Dana Abbott is a 50 y/o woman with severe obesity, pernicious anemia, iron deficiency anemia, CAD (s/p MI 2003), history of DVT, and CHF (EF 55%) who presents for acute hypoxic respiratory failure. There were no acute events overnight. She says her chest pain and shortness of breath are better, but she is requiring oxygen to maintain saturation. She denies palpitations. PT recommended therapy at least 3x/week to achieve ambulatory status again. She told nutrition consult she was not interested in nutrition education. Ultrasound yesterday showed axillary cysts causing pain are likely abscesses or old hematomas. Echocardiogram yesterday showed preserved left ventricular function (EF 55%) and severely dilated right ventricle with peak pulmonary artery pressure 55 mmHg.  Objective: Vital signs in last 24 hours: Filed Vitals:   09/23/15 1628 09/23/15 2036 09/24/15 0042 09/24/15 0622  BP: 102/65 108/61 115/57 122/42  Pulse: 84 93 105 95  Temp: 98.9 F (37.2 C) 98.6 F (37 C) 98.5 F (36.9 C) 98.4 F (36.9 C)  TempSrc: Oral Oral Oral Oral  Resp: 18 18 18 18   Height:      Weight:    171.46 kg (378 lb)  SpO2: 89% 95% 91% 92%   Weight change: 5.443 kg (12 lb)  Intake/Output Summary (Last 24 hours) at 09/24/15 0744 Last data filed at 09/24/15 0622  Gross per 24 hour  Intake    480 ml  Output   6350 ml  Net  -5870 ml   Physical Exam: CV: RRR. Normal S1/S2. No m/r/g. JVD ~10 cm. Pulm: CTAB Abdomen: Soft, NT/ND. Positive bowel sounds. Ext: Mild edema in bilateral lower extremities.  Lab Results: Basic Metabolic Panel:  Recent Labs  16/10/96 0250 09/24/15 0315  NA 139 141  K 4.0 3.5  CL 103 100*  CO2 32 34*  GLUCOSE 102* 99  BUN 8 9  CREATININE 0.69 0.73  CALCIUM 8.1* 7.8*  CBC:  Recent Labs  09/22/15 1000 09/23/15 0250  WBC 5.3 4.1  HGB 8.9* 8.3*  HCT 33.3* 32.3*  MCV 82.0 83.5  PLT 281 238   Cardiac Enzymes:  Recent Labs  09/22/15 1707  TROPONINI  <0.03   Anemia Panel:  Recent Labs  09/23/15 0250  FOLATE 10.2   Coagulation:  Recent Labs  09/24/15 0315  LABPROT 15.2  INR 1.18   Micro Results: Recent Results (from the past 240 hour(s))  Blood culture (routine x 2)     Status: None (Preliminary result)   Collection Time: 09/22/15  1:15 PM  Result Value Ref Range Status   Specimen Description BLOOD RIGHT ANTECUBITAL  Final   Special Requests BOTTLES DRAWN AEROBIC ONLY 10CC  Final   Culture NO GROWTH 1 DAY  Final   Report Status PENDING  Incomplete  Blood culture (routine x 2)     Status: None (Preliminary result)   Collection Time: 09/22/15  1:21 PM  Result Value Ref Range Status   Specimen Description BLOOD RIGHT HAND  Final   Special Requests IN PEDIATRIC BOTTLE 4CC  Final   Culture NO GROWTH 1 DAY  Final   Report Status PENDING  Incomplete   Studies/Results: Dg Chest 2 View  09/22/2015  CLINICAL DATA:  Chest pain and shortness of breath beginning this morning. EXAM: CHEST  2 VIEW COMPARISON:  07/30/2015 and chest CT 07/31/2015 FINDINGS: Lungs are hypoinflated and demonstrate opacification over the left lower lobe likely pneumonia. No definite effusion. Mild prominence of the perihilar markings. Stable cardiomegaly. Remainder of the exam is unchanged. IMPRESSION: Opacification over the  posterior left lower lobe likely pneumonia. Stable cardiomegaly.  Possible mild vascular congestion. Electronically Signed   By: Elberta Fortis M.D.   On: 09/22/2015 10:40   Ct Chest W Contrast  09/22/2015  ADDENDUM REPORT: 09/22/2015 17:03 ADDENDUM: I spoke with the technologist who performed the vascular ultrasound, and she suspected bilateral axillary abscesses, right larger than left. Reviewing the images, there is no axillary fluid collection. The right pectoral muscle shows some heterogenous attenuation, possibly beam hardening artifact, as well as possible mild enlargement/fullness of the lateral aspect of the right pectoralis major,  compared to the left pectoral major. The possibility of a pectoral muscle hematoma or, less likely, mass, is not excluded. As I discussed with the patient's physician, Dr. Karma Greaser, at 17:00 by phone, I would suggest a bilateral axillary ultrasound, with attention to the pectoral musculature, to evaluate further. If equivocal, chest MRI could be considered. Electronically Signed   By: Esperanza Heir M.D.   On: 09/22/2015 17:03  09/22/2015  CLINICAL DATA:  Further evaluation of possible axillary cysts/abscess seen on Vascular Ultrasound. EXAM: CT CHEST WITH CONTRAST TECHNIQUE: Multidetector CT imaging of the chest was performed during intravenous contrast administration. CONTRAST:  75 mL Isovue-300 COMPARISON:  07/31/2015 FINDINGS: 13 mm precarinal lymph node and 13 mm sub carinal lymph node. Mild cardiac enlargement. Minimal pericardial thickening versus trace pericardial effusion. Left lung base is excluded from the image. No pleural effusion identified. Visualized portion of left lung clear except for areas of mild atelectasis. There is mild dependent atelectasis in the right lower lobe. Mild mosaic hazy attenuation bilaterally. There is no evidence of significant axillary adenopathy on either side. A there is no evidence of abscess or other fluid collection in either axilla. There are left dorsal thoracic varices. Images through the upper abdomen show no acute findings. IMPRESSION: No evidence of axillary abnormality on either side A few nonspecific mildly enlarged mediastinal lymph nodes are present. Mild mosaic attenuation of both lungs with no other findings to increase specificity of this finding. Alternating areas of normal and mildly atelectatic lung can cause this appearance. Early findings of various infectious or inflammatory causes are another consideration. The study is somewhat limited by patient body habitus. Electronically Signed: By: Esperanza Heir M.D. On: 09/22/2015 14:52   [employer] [employer]  09/23/2015  CLINICAL DATA:  Complex area noted on venous duplex ultrasound. Patient complains of painful right axilla. EXAM: BILATERAL UPPER EXTREMITY LIMITED SOFT TISSUE ULTRASOUND TECHNIQUE: Ultrasound examination of the upper extremity soft tissues was performed in the area of clinical concern. COMPARISON:  None. FINDINGS: There is a 9.2 x 2.6 x 6.1 cm complex collection within the subcutaneous tissue of the right axilla, superficial to the axillary vessels. It demonstrates mixed echogenicity without internal increased blood flow. Similar but smaller collection is noted in the left axilla measuring 7.2 x 1.4 x 4.1 cm. IMPRESSION: Bilateral axillary complex collections, the larger one in the right axilla. These do not have the typical appearance of axillary lymphadenopathy, but rather may represent abscesses or resolving hematomas. Electronically Signed   By: Ted Mcalpine M.D.   On: 09/23/2015 13:05   Medications: I have reviewed the patient's current medications. Scheduled Meds: . aspirin EC  81 mg Oral Daily  . cyanocobalamin  1,000 mcg Intramuscular Daily  . docusate sodium  100 mg Oral BID  . enoxaparin (LOVENOX) injection  40 mg Subcutaneous Q24H  . ferrous sulfate  325 mg Oral TID WC  . furosemide  80 mg Intravenous BID  . multivitamin  1 tablet Oral Daily  . Vitamin D (Ergocalciferol)  50,000 Units Oral Q Thu   Continuous Infusions:  PRN Meds:.acetaminophen-codeine Assessment/Plan: Principal Problem:   Hypoxia Active Problems:   Blood loss anemia   Pernicious anemia   CAD (coronary artery disease), native coronary artery   History of DVT (deep vein thrombosis)   Respiratory failure, acute (HCC)   CHF (congestive heart failure) (HCC)  Ms. Edghill is a 50 y/o woman with CHF (EF 55%), CAD (s/p MI 2003), severe obesity, and anemia who presents for acute hypoxic respiratory failure.  Acute hypoxic respiratory failure: CXR, venous duplex, and CT discount pneumothorax, DVT,  PE, and aortic dissection. Negative troponin and unchanged EKG make ACS and tamponade unlikely. Echocardiogram concerning for right heart failure with preserved ejection fraction. Severe obesity and elevated HCO2 and PaCO2 also implicate obesity hypoventilation syndrome. Adequate urine output on furosemide with corresponding weight loss supports CHF exacerbation. - Furosemide IV 80 mg, BID - Start low dose lisinopril 2.5 mg after diuresis - BiPAP at night to help with ventilation/oxygenation - Strict I/Os - Daily weights - Wean O2 - Telemetry  Chest pain: No changes in EKG from 07/2015. Right-sided chest pain likely from abscess vs. hematoma seen on ultrasound. - Consult IR about potential tapping to guide treatment - PRN acetaminophen  Pernicious anemia with iron deficiency anemia: Admitted 07/2015 for anemia and was given 2 U pRBCs. Had low B12 and iron levels with elevated intrinsic factor antibodies at that time. Hg 8.3 and Hct 32.3 on 09/23/15 stable from admission in 07/2015. - B12 injection 1000 mcg, daily - Ferrous sulfate 325 mg, daily  CHF: Right-sided HF (EF 30%) seen on echocardiogram with preserved left ventricular function. Has lost 5.4 kg from last weight measurement. - Lasix IV 80 mg, BID - ACE inhibitor as above  CAD: s/p MI 2003. Has not been taking medications because of lack of insurance. - ASA 81 mg, daily - ACE inhibitor as above  Bilateral complex cyst: U/S shows likely abscess vs. hematoma. - Consult IR as above  DVT history: post MI (2003) - Lovenox injection 40 mg, daily  This is a Psychologist, occupational Note.  The care of the patient was discussed with Dr. Sheliah Hatch and the assessment and plan formulated with their assistance.  Please see their attached note for official documentation of the daily encounter.   LOS: 2 days   Dana Abbott, Med Student 09/24/2015, 7:44 AM

## 2015-09-25 DIAGNOSIS — J9621 Acute and chronic respiratory failure with hypoxia: Secondary | ICD-10-CM

## 2015-09-25 DIAGNOSIS — E662 Morbid (severe) obesity with alveolar hypoventilation: Secondary | ICD-10-CM

## 2015-09-25 LAB — BASIC METABOLIC PANEL
Anion gap: 8 (ref 5–15)
BUN: 9 mg/dL (ref 6–20)
CO2: 36 mmol/L — ABNORMAL HIGH (ref 22–32)
CREATININE: 0.71 mg/dL (ref 0.44–1.00)
Calcium: 7.6 mg/dL — ABNORMAL LOW (ref 8.9–10.3)
Chloride: 95 mmol/L — ABNORMAL LOW (ref 101–111)
GFR calc Af Amer: >60 mL/min (ref >60)
GLUCOSE: 89 mg/dL (ref 65–99)
Potassium: 3.7 mmol/L (ref 3.5–5.1)
SODIUM: 139 mmol/L (ref 135–145)

## 2015-09-25 LAB — CBC
HEMATOCRIT: 32.4 % — AB (ref 36.0–46.0)
Hemoglobin: 8.6 g/dL — ABNORMAL LOW (ref 12.0–15.0)
MCH: 21.6 pg — ABNORMAL LOW (ref 26.0–34.0)
MCHC: 26.5 g/dL — AB (ref 30.0–36.0)
MCV: 81.4 fL (ref 78.0–100.0)
PLATELETS: 193 10*3/uL (ref 150–400)
RBC: 3.98 MIL/uL (ref 3.87–5.11)
RDW: 21 % — AB (ref 11.5–15.5)
WBC: 4.8 10*3/uL (ref 4.0–10.5)

## 2015-09-25 MED ORDER — VITAMIN D 1000 UNITS PO TABS
1000.0000 [IU] | ORAL_TABLET | Freq: Once | ORAL | Status: AC
  Administered 2015-09-25: 1000 [IU] via ORAL
  Filled 2015-09-25: qty 1

## 2015-09-25 MED ORDER — VITAMIN D 1000 UNITS PO TABS: 10000.0000 [IU] | ORAL_TABLET | Freq: Every day | ORAL | Status: DC

## 2015-09-25 NOTE — Progress Notes (Signed)
Subjective: Continues to have good UOP and improvement in dyspnea and chest pain with diuresis. Rt shoulder pain has improved, she rates it as 1/10, denies rt hand numbness or weakness. Her breathing has improved.   Objective: Vital signs in last 24 hours: Filed Vitals:   09/24/15 2050 09/24/15 2321 09/25/15 0608 09/25/15 0900  BP: 94/52  96/62 89/55  Pulse: 91 81 85 106  Temp: 98.5 F (36.9 C)  98.3 F (36.8 C) 98.5 F (36.9 C)  TempSrc: Oral  Oral Oral  Resp: Height:      Weight:   368 lb (166.924 kg)   SpO2: 93% 94% 91% 96%   Weight change: -10 lb (-4.536 kg)  Intake/Output Summary (Last 24 hours) at 09/25/15 1109 Last data filed at 09/25/15 0844  Gross per 24 hour  Intake    702 ml  Output   3275 ml  Net  -2573 ml   GENERAL- morbidly obese woman lying in bed in NAD HEENT- Atraumatic, oral mucosa appears moist CARDIAC- RRR, no murmurs, rubs or gallops RESP- Distant breath sounds, good air movement throughout, no crackles EXTREMITIES- lymphedema, no pitting edema on exam today SKIN- Warm, dry, No rash or lesion PSYCH- Normal mood and affect, appropriate thought content and speech  Lab Results: Basic Metabolic Panel:  Recent Labs Lab 09/24/15 0315 09/25/15 0257  NA 141 139  K 3.5 3.7  CL 100* 95*  CO2 34* 36*  GLUCOSE 99 89  BUN 9 9  CREATININE 0.73 0.71  CALCIUM 7.8* 7.6*   CBC:  Recent Labs Lab 09/23/15 0250 09/25/15 0257  WBC 4.1 4.8  HGB 8.3* 8.6*  HCT 32.3* 32.4*  MCV 83.5 81.4  PLT 238 193   Cardiac Enzymes:  Recent Labs Lab 09/22/15 1707  TROPONINI <0.03   Anemia Panel:  Recent Labs Lab 09/23/15 0250  FOLATE 10.2   Urine Micro Results: Recent Results (from the past 240 hour(s))  Blood culture (routine x 2)     Status: None (Preliminary result)   Collection Time: 09/22/15  1:15 PM  Result Value Ref Range Status   Specimen Description BLOOD RIGHT ANTECUBITAL  Final   Special Requests BOTTLES DRAWN AEROBIC ONLY  10CC  Final   Culture NO GROWTH 3 DAYS  Final   Report Status PENDING  Incomplete  Blood culture (routine x 2)     Status: None (Preliminary result)   Collection Time: 09/22/15  1:21 PM  Result Value Ref Range Status   Specimen Description BLOOD RIGHT HAND  Final   Special Requests IN PEDIATRIC BOTTLE 4CC  Final   Culture NO GROWTH 3 DAYS  Final   Report Status PENDING  Incomplete   Studies/Results: [place] [employer]  09/23/2015  CLINICAL DATA:  Complex area noted on venous duplex ultrasound. Patient complains of painful right axilla. EXAM: BILATERAL UPPER EXTREMITY LIMITED SOFT TISSUE ULTRASOUND TECHNIQUE: Ultrasound examination of the upper extremity soft tissues was performed in the area of clinical concern. COMPARISON:  None. FINDINGS: There is a 9.2 x 2.6 x 6.1 cm complex collection within the subcutaneous tissue of the right axilla, superficial to the axillary vessels. It demonstrates mixed echogenicity without internal increased blood flow. Similar but smaller collection is noted in the left axilla measuring 7.2 x 1.4 x 4.1 cm. IMPRESSION: Bilateral axillary complex collections, the larger one in the right axilla. These do not have the typical appearance of axillary lymphadenopathy, but rather may represent abscesses or resolving  hematomas. Electronically Signed   By: Ted Mcalpine M.D.   On: 09/23/2015 13:05   Medications: I have reviewed the patient's current medications. Scheduled Meds: . aspirin EC  81 mg Oral Daily  . cyanocobalamin  1,000 mcg Intramuscular Daily  . docusate sodium  100 mg Oral BID  . enoxaparin (LOVENOX) injection  80 mg Subcutaneous Q24H  . ferrous sulfate  325 mg Oral TID WC  . multivitamin  1 tablet Oral Daily  . Vitamin D (Ergocalciferol)  50,000 Units Oral Q Thu   Continuous Infusions:  PRN Meds:.acetaminophen-codeine Assessment/Plan:   Acute Hypoxic Respiratory Failure 2/2 heart failure exacerbation: Weight down to 368 from 399 on admission,  previously d/c'd from hospital in March 2017 w/ weight of 367. Her creatinine is stable and she has excellent UOP, w/ neg net balance of 11.8L. However her bicarb has increased and BP have been low. She has been on IV lasix 80mg  BID and received one dose today, will stop IV lasix, monitor BP and repeat BMET tomorrow. Will reassess in the am and resume po lasix.  - BMET in the am -Daily Weights -Telemetry -Wean O2 to room air -Start qHS BIPAP - PT consulted  Pulmonary HTN in setting of obesity hypoventilation syndrome-- ECHO this admission w/ mean PA pressure of and cor pulmonale likely from OHS. She reports having a formal sleep study 3 years ago and used CPAP for a while but was told her problem was not apneic episodes. Given her chronic respiratory acidosis with compensatory metabolic alkalosis. She was started on BIPAP qhs.   Atypical Chest Pain: discussed R axillary fluid collection with interventional radiology service, who reviewed imaging and feel this is likely soft tissue edema. In the absence of inflammatory or infectious symptoms very low suspicion for abscess. Bilateral findings also support this consideration. - rt axillary pain has improved to 1/10, will cancel rt axillary fluid collection drainage, pt agreeable to this  CAD s/p MI (2003), unspecified heart failure: History of CAD with MI in 2003. New TTE 09/23/15 showing LVEF of 55%, significant right ventricle and atrium enlargement with estimated max PA pressure of . 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 She does have a history of past DVT and chronic pulmonary embolic disease would be a less likely alternative explanation. Chest CT was negative for other pulmonary disease processes. -ASA 81 mg daily  Pernicious Anemia and Iron Deficiency Anemia: Recent admission in 07/2015 for anemia requiring 2 U pRBCs. At that time she had low B12 and ferritin of 4. Elevated intrinsic factor antibody in  07/2015. History of gastric bypass. Reports not taking B12 at home due to lack of funds. Reports taking iron supplements every other day at home. She was not interested in continued nutrition counseling, consult and assessment appreciated. -B12 injection 1000 mcg, daily, on discharge will transition to sublingual vit b12 qd -Ferrous sulfate 325 mg daily - vitamin D3 10,000IU qd x 2 months (recieved one dose of VIt D 50,000) - 500mg  calcium citrate   FULL CODE Diet: Regular VTE ppx: [place] enoxaparin  Dispo: Disposition is deferred at this time, awaiting improvement of current medical problems. With continued clinic improvement will likely be appropriate for discharge in 1-2 days.  The patient does have a current PCP [facility]) and does need an [facility] hospital follow-up appointment after discharge.  The patient does not have transportation limitations that hinder transportation to clinic appointments.   LOS: 3 days  Denton Brick, MD 09/25/2015, 11:09 AM

## 2015-09-25 NOTE — Progress Notes (Signed)
Physical Therapy Treatment Patient Details Name: Dana Abbott MRN: [id number] DOB: [birth year] Today's Date: 09/25/2015    History of Present Illness Pt is a 50 y/o F presenting for acute hypoxic respiratory failure (CHF exacerbation vs. obesity hypoventilation).  Pt's PMH includes severe obesity s/p gastric bypass, CAD s/p MI, pernicious anemia, iron deficiency anemia, DVT, CHF.      PT Comments    Pt very motivated and wanting to get up today "no one lies in bed for day's like this". Less physical assistance needed with bed mobility, however pt continues to be limited in standing due to knee pain. Pt wanting to push through and get up to chair, however this PTA had concerns on her getting up from the chair to get back to bed if we made it there due to it's lower surface. Pt agreed this would be an issue. Pt left sitting edge of bed with RN approval as her unsupported sitting balance is good. Pt continues to need therapy to increase her strength and independence to return home with family assistance. Do feel she can participate in therapy requirements of CIR. Continue to recommend CIR. Pt's mobility is not a a safe level to discharge home at this time based on her progress toward goals set on PT evaluation.   Follow Up Recommendations  CIR     Equipment Recommendations  Other (comment) (TBD by next venue)    Recommendations for Other Services OT consult;Rehab consult     Precautions / Restrictions Precautions Precautions: Fall Restrictions Weight Bearing Restrictions: No    Mobility  Bed Mobility Overal bed mobility: + 2 for safety/equipment Bed Mobility: Supine to Sit     Supine to sit: Min assist;Max assist     General bed mobility comments: max assist to get hips moved to edge of bed using pad under pt's hips (she and mats were stuck to matttress, pt providing max effort to assist).  once pt's hips were moved laterally to edge of bed, pt was min assist to sit up at the edge  with bed flat and rails used. Increased time needed due to right leg pain with movement.  Transfers Overall transfer level: Needs assistance Equipment used: 2 person hand held assist Transfers: Sit to/from Stand Sit to Stand: +2 safety/equipment;+2 physical assistance         General transfer comment: sit<>stand was attempted x 2 from bed:1st rep pt unable to clear buttocks from bed surface, 2cd attempt pt was able to barely clear buttocks from bed surfaces. Both times pt was limited by right knee pain with weight bearing. Increased time needed.                                Cognition Arousal/Alertness: Awake/alert Behavior During Therapy: WFL for tasks assessed/performed Overall Cognitive Status: Within Functional Limits for tasks assessed                       Pertinent Vitals/Pain Pain Assessment: 0-10 Pain Score: 9  Pain Location: right knee/leg  Pain Descriptors / Indicators: Aching;Sore;Tightness Pain Intervention(s): Limited activity within patient's tolerance;Monitored during session;Premedicated before session;Repositioned     PT Goals (current goals can now be found in the care plan section) Acute Rehab PT Goals Patient Stated Goal: to get stronger and to regain her independence before returning home PT Goal Formulation: With patient/family Time For Goal Achievement: 10/07/15 Potential to Achieve Goals: Good Progress  towards PT goals: Progressing toward goals    Frequency  Min 3X/week    PT Plan Current plan remains appropriate    End of Session   Activity Tolerance: Patient limited by pain Patient left: in bed;with call bell/phone within reach (EOB )     Time: 7014-1030 PT Time Calculation (min) (ACUTE ONLY): 24 min  Charges:  $Therapeutic Activity: 23-37 mins           Sallyanne Kuster 09/25/2015, 12:16 PM   Sallyanne Kuster, PTA, CLT Acute Rehab Services [phone] 09/25/2015, 12:20 PM

## 2015-09-26 LAB — BASIC METABOLIC PANEL
ANION GAP: 10 (ref 5–15)
BUN: 8 mg/dL (ref 6–20)
CHLORIDE: 95 mmol/L — AB (ref 101–111)
CO2: 34 mmol/L — ABNORMAL HIGH (ref 22–32)
Calcium: 7.8 mg/dL — ABNORMAL LOW (ref 8.9–10.3)
Creatinine, Ser: 0.59 mg/dL (ref 0.44–1.00)
GFR calc Af Amer: >60 mL/min (ref >60)
GLUCOSE: 88 mg/dL (ref 65–99)
POTASSIUM: 3.8 mmol/L (ref 3.5–5.1)
Sodium: 139 mmol/L (ref 135–145)

## 2015-09-26 MED ORDER — VITAMIN D 1000 UNITS PO TABS
5000.0000 [IU] | ORAL_TABLET | Freq: Two times a day (BID) | ORAL | Status: DC
  Administered 2015-09-26 – 2015-09-27 (×3): 5000 [IU] via ORAL
  Filled 2015-09-26 (×3): qty 5

## 2015-09-26 MED ORDER — ACETAMINOPHEN 325 MG PO TABS
650.0000 mg | ORAL_TABLET | Freq: Four times a day (QID) | ORAL | Status: DC | PRN
  Administered 2015-09-26: 650 mg via ORAL
  Filled 2015-09-26: qty 2

## 2015-09-26 MED ORDER — MELOXICAM 7.5 MG PO TABS
15.0000 mg | ORAL_TABLET | Freq: Every day | ORAL | Status: DC
  Administered 2015-09-26 – 2015-09-27 (×2): 15 mg via ORAL
  Filled 2015-09-26 (×2): qty 1

## 2015-09-26 MED ORDER — FUROSEMIDE 40 MG PO TABS
40.0000 mg | ORAL_TABLET | Freq: Every day | ORAL | Status: DC
  Administered 2015-09-26 – 2015-09-27 (×2): 40 mg via ORAL
  Filled 2015-09-26 (×2): qty 1

## 2015-09-26 NOTE — Procedures (Signed)
Placed patient on BIPAP for the night.  Patient is tolerating well at this time. 

## 2015-09-26 NOTE — Progress Notes (Signed)
Subjective: Feeling good this morning sitting up in bed. She feels significantly weaker than her usual and asked about changing to a bed in which she can more easily sit on the side. Dyspnea and chest pain fully improved, does still have mild bilateral shin soreness. Agreeable to inpatient rehab for her deconditioning.  Objective: Vital signs in last 24 hours: Filed Vitals:   09/25/15 1206 09/25/15 2110 09/25/15 2319 09/26/15 0645  BP: 106/64 103/73  120/76  Pulse: 86 78 77 71  Temp: 98.4 F (36.9 C) 98.6 F (37 C)  98.5 F (36.9 C)  TempSrc: Oral Oral  Oral  Resp: 18 18 22 20   Height:      Weight:      SpO2: 98% 96% 95% 96%   Weight change:   Intake/Output Summary (Last 24 hours) at 09/26/15 1015 Last data filed at 09/26/15 0900  Gross per 24 hour  Intake    360 ml  Output   3125 ml  Net  -2765 ml   GENERAL- morbidly obese woman lying in bed in NAD CARDIAC- RRR, no murmurs, rubs or gallops RESP- Distant breath sounds, good air movement throughout, EXTREMITIES- lymphedema, no pitting edema on exam today, mild tenderness over medial knees/calves without erythema or induration SKIN- Warm, dry, No rash or lesion PSYCH- Normal mood and affect, appropriate thought content and speech  Lab Results: Basic Metabolic Panel:  Recent Labs Lab 09/25/15 0257 09/26/15 0317  NA 139 139  K 3.7 3.8  CL 95* 95*  CO2 36* 34*  GLUCOSE 89 88  BUN 9 8  CREATININE 0.71 0.59  CALCIUM 7.6* 7.8*   CBC:  Recent Labs Lab 09/23/15 0250 09/25/15 0257  WBC 4.1 4.8  HGB 8.3* 8.6*  HCT 32.3* 32.4*  MCV 83.5 81.4  PLT 238 193   Anemia Panel:  Recent Labs Lab 09/23/15 0250  FOLATE 10.2   Studies/Results: No results found. Medications: I have reviewed the patient's current medications. Scheduled Meds: . aspirin EC  81 mg Oral Daily  . cholecalciferol  10,000 Units Oral Daily  . cyanocobalamin  1,000 mcg Intramuscular Daily  . docusate sodium  100 mg Oral BID  . enoxaparin  (LOVENOX) injection  80 mg Subcutaneous Q24H  . ferrous sulfate  325 mg Oral TID WC  . furosemide  40 mg Oral Daily  . multivitamin  1 tablet Oral Daily   Continuous Infusions:  PRN Meds:.acetaminophen-codeine Assessment/Plan:  Acute Hypoxic Respiratory Failure 2/2 heart failure exacerbation: Weight down to 368 from 399 on admission, previously d/c'd from hospital in March 2017 w/ weight of 367. Her creatinine is stable and she has excellent UOP, w/ neg net balance of 11.8L. Breathing easily on room air. IV diuresis was discontinued after reaching a point of mild prerenal AKI and hypotension. This is most likely due to her R sided heart dysfunction/PA HTN and dependency on preload for systemic pressure. While in bed deconditioning also present for which she needs continued PT eval and treatment. -Daily Weights and Bmet -Telemetry -qHS BIPAP -PT consulted, ideally plan for inpatient rehab placement as heart failure treatment now in a stable condition  Pulmonary HTN in setting of obesity hypoventilation syndrome-- ECHO this admission w/ mean PA pressure of and cor pulmonale likely from OHS. She reports having a formal sleep study 3 years ago and used CPAP for a while but was told her problem was not apneic episodes. Given her chronic respiratory acidosis with compensatory metabolic alkalosis more of an OHS  picture but could be mixed. Chronic embolic disease much less likely. She slept very well on BIPAP overnight.   Atypical Chest Pain: Pain improved and no plan to aspirate or further evaluated R axillary fluid collection at this time.  CAD s/p MI (2003), unspecified heart failure: History of CAD with MI in 2003. New TTE 09/23/15 showing LVEF of 55%, significant right ventricle and atrium enlargement with estimated max PA pressure of . -ASA 81 mg daily  Pernicious Anemia and Iron Deficiency Anemia: Recent admission in 07/2015 for anemia requiring 2 U pRBCs. At that time she had low B12  and ferritin of 4. Elevated intrinsic factor antibody in 07/2015. History of gastric bypass. Reports not taking B12 at home due to lack of funds. Reports taking iron supplements every other day at home. She was not interested in continued nutrition counseling, consult and assessment appreciated. -B12 injection 1000 mcg, daily, on discharge will transition to sublingual vit b12 qd -Ferrous sulfate 325 mg daily -Vitamin D3 10,000IU QID x 2 months (recieved one dose of VIt D 50,000) -  calcium citrate  FULL CODE Diet: Regular VTE ppx: [place] enoxaparin  Dispo: Disposition is deferred at this time, awaiting improvement of current medical problems. With continued clinic improvement will likely be appropriate for discharge in 1-2 days.  The patient does have a current PCP [facility]) and does need an [facility] hospital follow-up appointment after discharge.  The patient does not have transportation limitations that hinder transportation to clinic appointments.   LOS: 4 days   Fuller Plan, MD 09/26/2015, 10:15 AM

## 2015-09-27 ENCOUNTER — Inpatient Hospital Stay (HOSPITAL_COMMUNITY)
Admission: RE | Admit: 2015-09-27 | Discharge: 2015-10-06 | DRG: 091 | Disposition: A | Discharge status: Home Health | Payer: PRIVATE HEALTH INSURANCE | Source: Intra-hospital | Attending: Physical Medicine & Rehabilitation | Admitting: Physical Medicine & Rehabilitation

## 2015-09-27 DIAGNOSIS — Z86718 Personal history of other venous thrombosis and embolism: Secondary | ICD-10-CM

## 2015-09-27 DIAGNOSIS — I252 Old myocardial infarction: Secondary | ICD-10-CM

## 2015-09-27 DIAGNOSIS — M1711 Unilateral primary osteoarthritis, right knee: Secondary | ICD-10-CM | POA: Insufficient documentation

## 2015-09-27 DIAGNOSIS — R0902 Hypoxemia: Secondary | ICD-10-CM | POA: Diagnosis present

## 2015-09-27 DIAGNOSIS — R29898 Other symptoms and signs involving the musculoskeletal system: Secondary | ICD-10-CM | POA: Insufficient documentation

## 2015-09-27 DIAGNOSIS — R5381 Other malaise: Secondary | ICD-10-CM

## 2015-09-27 DIAGNOSIS — I5033 Acute on chronic diastolic (congestive) heart failure: Secondary | ICD-10-CM | POA: Diagnosis present

## 2015-09-27 DIAGNOSIS — R52 Pain, unspecified: Secondary | ICD-10-CM | POA: Insufficient documentation

## 2015-09-27 DIAGNOSIS — M25562 Pain in left knee: Secondary | ICD-10-CM | POA: Diagnosis present

## 2015-09-27 DIAGNOSIS — T502X5A Adverse effect of carbonic-anhydrase inhibitors, benzothiadiazides and other diuretics, initial encounter: Secondary | ICD-10-CM | POA: Diagnosis present

## 2015-09-27 DIAGNOSIS — R269 Unspecified abnormalities of gait and mobility: Principal | ICD-10-CM | POA: Diagnosis present

## 2015-09-27 DIAGNOSIS — R609 Edema, unspecified: Secondary | ICD-10-CM | POA: Diagnosis present

## 2015-09-27 DIAGNOSIS — G4733 Obstructive sleep apnea (adult) (pediatric): Secondary | ICD-10-CM

## 2015-09-27 DIAGNOSIS — Z9884 Bariatric surgery status: Secondary | ICD-10-CM

## 2015-09-27 DIAGNOSIS — I5043 Acute on chronic combined systolic (congestive) and diastolic (congestive) heart failure: Secondary | ICD-10-CM

## 2015-09-27 DIAGNOSIS — Z8249 Family history of ischemic heart disease and other diseases of the circulatory system: Secondary | ICD-10-CM

## 2015-09-27 DIAGNOSIS — E559 Vitamin D deficiency, unspecified: Secondary | ICD-10-CM | POA: Diagnosis present

## 2015-09-27 DIAGNOSIS — G8929 Other chronic pain: Secondary | ICD-10-CM | POA: Diagnosis present

## 2015-09-27 DIAGNOSIS — D510 Vitamin B12 deficiency anemia due to intrinsic factor deficiency: Secondary | ICD-10-CM | POA: Diagnosis present

## 2015-09-27 DIAGNOSIS — Z7982 Long term (current) use of aspirin: Secondary | ICD-10-CM

## 2015-09-27 DIAGNOSIS — E8809 Other disorders of plasma-protein metabolism, not elsewhere classified: Secondary | ICD-10-CM | POA: Diagnosis present

## 2015-09-27 DIAGNOSIS — Z6841 Body Mass Index (BMI) 40.0 and over, adult: Secondary | ICD-10-CM

## 2015-09-27 DIAGNOSIS — E538 Deficiency of other specified B group vitamins: Secondary | ICD-10-CM | POA: Diagnosis present

## 2015-09-27 DIAGNOSIS — E662 Morbid (severe) obesity with alveolar hypoventilation: Secondary | ICD-10-CM | POA: Insufficient documentation

## 2015-09-27 DIAGNOSIS — R0609 Other forms of dyspnea: Secondary | ICD-10-CM

## 2015-09-27 DIAGNOSIS — Z91013 Allergy to seafood: Secondary | ICD-10-CM

## 2015-09-27 DIAGNOSIS — I952 Hypotension due to drugs: Secondary | ICD-10-CM | POA: Insufficient documentation

## 2015-09-27 DIAGNOSIS — D509 Iron deficiency anemia, unspecified: Secondary | ICD-10-CM | POA: Diagnosis present

## 2015-09-27 DIAGNOSIS — I959 Hypotension, unspecified: Secondary | ICD-10-CM | POA: Insufficient documentation

## 2015-09-27 DIAGNOSIS — E8770 Fluid overload, unspecified: Secondary | ICD-10-CM | POA: Diagnosis not present

## 2015-09-27 DIAGNOSIS — Z9981 Dependence on supplemental oxygen: Secondary | ICD-10-CM | POA: Insufficient documentation

## 2015-09-27 DIAGNOSIS — I2510 Atherosclerotic heart disease of native coronary artery without angina pectoris: Secondary | ICD-10-CM | POA: Diagnosis present

## 2015-09-27 DIAGNOSIS — E46 Unspecified protein-calorie malnutrition: Secondary | ICD-10-CM | POA: Diagnosis present

## 2015-09-27 DIAGNOSIS — M25561 Pain in right knee: Secondary | ICD-10-CM | POA: Diagnosis present

## 2015-09-27 DIAGNOSIS — Z79899 Other long term (current) drug therapy: Secondary | ICD-10-CM

## 2015-09-27 LAB — BASIC METABOLIC PANEL
ANION GAP: 5 (ref 5–15)
BUN: 10 mg/dL (ref 6–20)
CHLORIDE: 97 mmol/L — AB (ref 101–111)
CO2: 36 mmol/L — ABNORMAL HIGH (ref 22–32)
Calcium: 8.3 mg/dL — ABNORMAL LOW (ref 8.9–10.3)
Creatinine, Ser: 0.62 mg/dL (ref 0.44–1.00)
GFR calc non Af Amer: >60 mL/min (ref >60)
Glucose, Bld: 101 mg/dL — ABNORMAL HIGH (ref 65–99)
POTASSIUM: 3.8 mmol/L (ref 3.5–5.1)
SODIUM: 138 mmol/L (ref 135–145)

## 2015-09-27 LAB — CULTURE, BLOOD (ROUTINE X 2)
CULTURE: NO GROWTH
CULTURE: NO GROWTH

## 2015-09-27 MED ORDER — ENOXAPARIN SODIUM 80 MG/0.8ML ~~LOC~~ SOLN
80.0000 mg | SUBCUTANEOUS | Status: DC
  Administered 2015-09-28: 80 mg via SUBCUTANEOUS
  Filled 2015-09-27: qty 0.8

## 2015-09-27 MED ORDER — PROSIGHT PO TABS: 1.0000 | ORAL_TABLET | Freq: Every day | ORAL | Status: DC

## 2015-09-27 MED ORDER — CYANOCOBALAMIN 1000 MCG/ML IJ SOLN: 1000.0000 ug | Freq: Once | INTRAMUSCULAR | Status: AC

## 2015-09-27 MED ORDER — CHOLECALCIFEROL 250 MCG (10000 UT) PO TABS: 10000.0000 [IU] | ORAL_TABLET | Freq: Every day | ORAL | Status: DC

## 2015-09-27 MED ORDER — CYANOCOBALAMIN 1000 MCG/ML IJ SOLN: 1000.0000 ug | INTRAMUSCULAR | Status: DC

## 2015-09-27 MED ORDER — FLEET ENEMA 7-19 GM/118ML RE ENEM: 1.0000 | ENEMA | Freq: Once | RECTAL | Status: DC | PRN

## 2015-09-27 MED ORDER — PROCHLORPERAZINE MALEATE 5 MG PO TABS: 5.0000 mg | ORAL_TABLET | Freq: Four times a day (QID) | ORAL | Status: DC | PRN

## 2015-09-27 MED ORDER — VITAMIN D 1000 UNITS PO TABS
5000.0000 [IU] | ORAL_TABLET | Freq: Two times a day (BID) | ORAL | Status: DC
  Administered 2015-09-27 – 2015-10-06 (×18): 5000 [IU] via ORAL
  Filled 2015-09-27 (×17): qty 5

## 2015-09-27 MED ORDER — MELOXICAM 15 MG PO TABS
15.0000 mg | ORAL_TABLET | Freq: Every day | ORAL | Status: DC
Start: 2015-09-28 — End: 2015-10-06
  Administered 2015-09-28 – 2015-10-06 (×9): 15 mg via ORAL
  Filled 2015-09-27: qty 2
  Filled 2015-09-27 (×3): qty 1
  Filled 2015-09-27 (×3): qty 2
  Filled 2015-09-27: qty 1
  Filled 2015-09-27: qty 2

## 2015-09-27 MED ORDER — FUROSEMIDE 40 MG PO TABS
40.0000 mg | ORAL_TABLET | Freq: Every day | ORAL | Status: DC
  Administered 2015-09-28 – 2015-09-29 (×2): 40 mg via ORAL
  Filled 2015-09-27 (×2): qty 1

## 2015-09-27 MED ORDER — TRAZODONE HCL 50 MG PO TABS: 25.0000 mg | ORAL_TABLET | Freq: Every evening | ORAL | Status: DC | PRN

## 2015-09-27 MED ORDER — CETYLPYRIDINIUM CHLORIDE 0.05 % MT LIQD
7.0000 mL | Freq: Two times a day (BID) | OROMUCOSAL | Status: DC
  Administered 2015-09-28 – 2015-10-06 (×15): 7 mL via OROMUCOSAL

## 2015-09-27 MED ORDER — ALUM & MAG HYDROXIDE-SIMETH 200-200-20 MG/5ML PO SUSP
30.0000 mL | ORAL | Status: DC | PRN
  Administered 2015-10-03: 30 mL via ORAL

## 2015-09-27 MED ORDER — MELOXICAM 15 MG PO TABS: 15.0000 mg | ORAL_TABLET | Freq: Every day | ORAL | Status: DC

## 2015-09-27 MED ORDER — ACETAMINOPHEN 325 MG PO TABS
650.0000 mg | ORAL_TABLET | Freq: Four times a day (QID) | ORAL | Status: DC | PRN
  Administered 2015-09-27 – 2015-09-28 (×3): 650 mg via ORAL
  Filled 2015-09-27 (×5): qty 2

## 2015-09-27 MED ORDER — GUAIFENESIN-DM 100-10 MG/5ML PO SYRP: 5.0000 mL | ORAL_SOLUTION | Freq: Four times a day (QID) | ORAL | Status: DC | PRN

## 2015-09-27 MED ORDER — DIPHENHYDRAMINE HCL 12.5 MG/5ML PO ELIX
12.5000 mg | ORAL_SOLUTION | Freq: Four times a day (QID) | ORAL | Status: DC | PRN
Start: 2015-09-27 — End: 2015-10-06

## 2015-09-27 MED ORDER — FERROUS SULFATE 325 (65 FE) MG PO TABS
325.0000 mg | ORAL_TABLET | Freq: Three times a day (TID) | ORAL | Status: DC
  Administered 2015-09-28 – 2015-10-06 (×25): 325 mg via ORAL
  Filled 2015-09-27 (×26): qty 1

## 2015-09-27 MED ORDER — PROSIGHT PO TABS
1.0000 | ORAL_TABLET | Freq: Every day | ORAL | Status: DC
  Administered 2015-09-28 – 2015-10-06 (×9): 1 via ORAL
  Filled 2015-09-27 (×9): qty 1

## 2015-09-27 MED ORDER — FERROUS SULFATE 325 (65 FE) MG PO TABS: 325.0000 mg | ORAL_TABLET | Freq: Every day | ORAL | Status: DC

## 2015-09-27 MED ORDER — PROCHLORPERAZINE EDISYLATE 5 MG/ML IJ SOLN
5.0000 mg | Freq: Four times a day (QID) | INTRAMUSCULAR | Status: DC | PRN
  Administered 2015-10-03: 10 mg via INTRAMUSCULAR
  Filled 2015-09-27: qty 2

## 2015-09-27 MED ORDER — ASPIRIN EC 81 MG PO TBEC
81.0000 mg | DELAYED_RELEASE_TABLET | Freq: Every day | ORAL | Status: DC
  Administered 2015-09-28 – 2015-10-06 (×9): 81 mg via ORAL
  Filled 2015-09-27 (×10): qty 1

## 2015-09-27 MED ORDER — DOCUSATE SODIUM 100 MG PO CAPS
100.0000 mg | ORAL_CAPSULE | Freq: Two times a day (BID) | ORAL | Status: DC
  Administered 2015-09-28 – 2015-10-04 (×4): 100 mg via ORAL
  Filled 2015-09-27 (×16): qty 1

## 2015-09-27 MED ORDER — FUROSEMIDE 40 MG PO TABS: 40.0000 mg | ORAL_TABLET | Freq: Every day | ORAL | Status: DC

## 2015-09-27 MED ORDER — BISACODYL 10 MG RE SUPP: 10.0000 mg | Freq: Every day | RECTAL | Status: DC | PRN

## 2015-09-27 MED ORDER — PROCHLORPERAZINE 25 MG RE SUPP: 12.5000 mg | Freq: Four times a day (QID) | RECTAL | Status: DC | PRN

## 2015-09-27 NOTE — Discharge Summary (Signed)
Name: Dana Abbott MRN: [id number] DOB: 10-12-1965 50 y.o. PCP: [facility]  Date of Admission: 09/22/2015  9:52 AM Date of Discharge: 09/27/2015 Attending Physician: Inez Catalina, MD  Discharge Diagnosis: Principal Problem:   Hypoxia Active Problems:   Blood loss anemia   Pernicious anemia   CAD (coronary artery disease), native coronary artery   History of DVT (deep vein thrombosis)   Respiratory failure, acute (HCC)   CHF (congestive heart failure) (HCC)   Morbid obesity due to excess calories (HCC)   Exertional dyspnea   Requires supplemental oxygen   Primary osteoarthritis of right knee   Arterial hypotension  Discharge Medications:   Medication List    STOP taking these medications        hydrochlorothiazide 25 MG tablet  Commonly known as:  HYDRODIURIL     HYDROcodone-acetaminophen 5-325 MG tablet  Commonly known as:  NORCO/VICODIN     Potassium Chloride ER 20 MEQ Tbcr      TAKE these medications        aspirin 81 MG chewable tablet  Chew 162 mg by mouth once.     Cholecalciferol 10000 units Tabs  Take 10,000 Units by mouth daily.     docusate sodium 100 MG capsule  Commonly known as:  COLACE  Take 1 capsule (100 mg total) by mouth 2 (two) times daily.     ferrous sulfate 325 (65 FE) MG tablet  Take 1 tablet (325 mg total) by mouth daily with breakfast.     furosemide 40 MG tablet  Commonly known as:  LASIX  Take 1 tablet (40 mg total) by mouth daily.     meloxicam 15 MG tablet  Commonly known as:  MOBIC  Take 1 tablet (15 mg total) by mouth daily.     multivitamin Tabs tablet  Take 1 tablet by mouth daily.     TYLENOL 8 HOUR ARTHRITIS PAIN PO  Take 1,300 mg by mouth every 8 (eight) hours as needed (arthritis pain).        Disposition and follow-up:   Dana Abbott was discharged from [facility] in Good condition.  At the hospital follow up visit please address:  1. Physical deconditioning and  osteoarthritis: Dana Abbott has a baseline mobility that is limited by her obesity, osteoarthritis of the right knee, lymphedema of the lower extremities. She is now deconditioned due to recent hospitalization for a heart failure exacerbation triggered by inadequate diuresis at home and untreated OHS/OSA. She was ambulatory with no assistive device prior to her admission here.  2. Obesity hypoventilation syndrome or obstructive sleep apnea: [name] Currently the recommendation would be to continued treatment with BIPAP as OHS is the most likely factor.  3. 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 Ideally will be started on optimal heart failure therapy with dilated R atrium and ventricle. Only ASA and lasix 40mg  PO at this time limited by blood pressure and will need adjustment in clinic follow up. Next appropriate therapy would be on ACE-I.  4. Pernicious anemia and iron deficiency anemia: S/p gastric bypass surgery but also has abnormal intrinsic factor. As such requires parenteral or IM B12 supplementation. Oral vitamin D and iron supplementation should be continued with full response checked ideally after 2 months.  Labs/tests needed at  follow up: CBC and metabolic panel needed at clinic follow up. Vitamin D level should be reevaluated after 2 months of supplementation approximately 11/23/15. Sleep study will be needed to characterize her pulmonary disease underlying her presumed class 3 pulmonary HTN.   Follow-up Appointments: Dana Abbott will require medical follow up for management of her diuretics and newly diagnosed right sided heart failure.    Discharge Instructions:  Procedures Performed:  Dg Chest 2 View  09/22/2015  CLINICAL DATA:  Chest pain and shortness of breath  beginning this morning. EXAM: CHEST  2 VIEW COMPARISON:  07/30/2015 and chest CT 07/31/2015 FINDINGS: Lungs are hypoinflated and demonstrate opacification over the left lower lobe likely pneumonia. No definite effusion. Mild prominence of the perihilar markings. Stable cardiomegaly. Remainder of the exam is unchanged. IMPRESSION: Opacification over the posterior left lower lobe likely pneumonia. Stable cardiomegaly.  Possible mild vascular congestion. Electronically Signed   By: Elberta Fortis M.D.   On: 09/22/2015 10:40   Ct Chest W Contrast  09/22/2015  ADDENDUM REPORT: 09/22/2015 17:03 ADDENDUM: I spoke with the technologist who performed the vascular ultrasound, and she suspected bilateral axillary abscesses, right larger than left. Reviewing the images, there is no axillary fluid collection. The right pectoral muscle shows some heterogenous attenuation, possibly beam hardening artifact, as well as possible mild enlargement/fullness of the lateral aspect of the right pectoralis major, compared to the left pectoral major. The possibility of a pectoral muscle hematoma or, less likely, mass, is not excluded. As I discussed with the patient's physician, Dr. Karma Greaser, at 17:00 by phone, I would suggest a bilateral axillary ultrasound, with attention to the pectoral musculature, to evaluate further. If equivocal, chest MRI could be considered. Electronically Signed   By: Esperanza Heir M.D.   On: 09/22/2015 17:03  09/22/2015  CLINICAL DATA:  Further evaluation of possible axillary cysts/abscess seen on Vascular Ultrasound. EXAM: CT CHEST WITH CONTRAST TECHNIQUE: Multidetector CT imaging of the chest was performed during intravenous contrast administration. CONTRAST:  75 mL Isovue-300 COMPARISON:  07/31/2015 FINDINGS: 13 mm precarinal lymph node and 13 mm sub carinal lymph node. Mild cardiac enlargement. Minimal pericardial thickening versus trace pericardial effusion. Left lung base is excluded from the image. No  pleural effusion identified. Visualized portion of left lung clear except for areas of mild atelectasis. There is mild dependent atelectasis in the right lower lobe. Mild mosaic hazy attenuation bilaterally. There is no evidence of significant axillary adenopathy on either side. A there is no evidence of abscess or other fluid collection in either axilla. There are left dorsal thoracic varices. Images through the upper abdomen show no acute findings. IMPRESSION: No evidence of axillary abnormality on either side A few nonspecific mildly enlarged mediastinal lymph nodes are present. Mild mosaic attenuation of both lungs with no other findings to increase specificity of this finding. Alternating areas of normal and mildly atelectatic lung can cause this appearance. Early findings of various infectious or inflammatory causes are another consideration. The study is somewhat limited by patient body habitus. Electronically Signed: By: Esperanza Heir M.D. On: 09/22/2015 14:52   [employer] [employer]  09/23/2015  CLINICAL DATA:  Complex area noted on venous duplex ultrasound. Patient complains of painful right axilla. EXAM: BILATERAL UPPER EXTREMITY LIMITED SOFT TISSUE ULTRASOUND TECHNIQUE: Ultrasound examination of the upper extremity soft tissues was performed in the area of clinical concern. COMPARISON:  None. FINDINGS: There is a 9.2 x 2.6 x 6.1 cm complex collection within the subcutaneous tissue of  the right axilla, superficial to the axillary vessels. It demonstrates mixed echogenicity without internal increased blood flow. Similar but smaller collection is noted in the left axilla measuring 7.2 x 1.4 x 4.1 cm. IMPRESSION: Bilateral axillary complex collections, the larger one in the right axilla. These do not have the typical appearance of axillary lymphadenopathy, but rather may represent abscesses or resolving hematomas. Electronically Signed   By: Ted Mcalpine M.D.   On: 09/23/2015 13:05   Dg Knee  Complete 4 Views Right  08/28/2015  CLINICAL DATA:  50 year old female with chronic right knee pain. EXAM: RIGHT KNEE - COMPLETE 4+ VIEW COMPARISON:  08/13/2015 FINDINGS: There is no evidence of acute fracture or dislocation. Severe degenerative changes in the medial compartment noted and mild to moderate degenerative changes are noted in the lateral and patellofemoral compartments. No suspicious focal bony lesions are identified. Body habitus limits evaluation for effusion. IMPRESSION: No evidence of acute bony abnormality. Tricompartmental degenerative changes, severe in the medial compartment. Electronically Signed   By: Harmon Pier M.D.   On: 08/28/2015 18:49    2D Echo: LV EF: 55%  ------------------------------------------------------------------- Indications: CHF - 428.0. ------------------------------------------------------------------- History: PMH: Acute right sided chest pain. SOB. Cough. Lower extremity swelling. Old MI. ------------------------------------------------------------------- Study Conclusions  - Left ventricle: D shaped septum from cor pulmonale. The cavity  size was normal. Wall thickness was increased in a pattern of  mild LVH. Systolic function was normal. The estimated ejection  fraction was 55%. Wall motion was normal; there were no regional  wall motion abnormalities. - Left atrium: The atrium was mildly dilated. - Right ventricle: The cavity size was severely dilated. Wall  thickness was normal. - Right atrium: The atrium was moderately dilated. - Atrial septum: No defect or patent foramen ovale was identified. - Pulmonary arteries: PA peak pressure: 55 mm Hg (S).  Transthoracic echocardiography. 2D, spectral Doppler, and color Doppler. Birthdate: Patient birthdate: October 29, 1965. Age: Patient is 50 yr old. Sex: Gender: female. BMI: 84.8 kg/m^2. Blood pressure: 112/66 Patient status: Inpatient. Study date: Study date:  09/23/2015. Study time: 02:47 PM. Location: Bedside. ------------------------------------------------------------------- Left ventricle: D shaped septum from cor pulmonale. The cavity size was normal. Wall thickness was increased in a pattern of mild LVH. Systolic function was normal. The estimated ejection fraction was 55%. Wall motion was normal; there were no regional wall motion abnormalities. ------------------------------------------------------------------- Aortic valve: Trileaflet; normal thickness, mildly calcified leaflets. Mobility was not restricted. Doppler: Transvalvular velocity was within the normal range. There was no stenosis. There was no regurgitation. ------------------------------------------------------------------- Aorta: The aorta was normal, not dilated, and non-diseased. Aortic root: The aortic root was normal in size. ------------------------------------------------------------------- Mitral valve: Mildly thickened leaflets . Mobility was not restricted. Doppler: Transvalvular velocity was within the normal range. There was no evidence for stenosis. There was trivial regurgitation. Peak gradient (D): 3 mm Hg. ------------------------------------------------------------------- Left atrium: The atrium was mildly dilated. ------------------------------------------------------------------- Atrial septum: No defect or patent foramen ovale was identified. ------------------------------------------------------------------- Right ventricle: The cavity size was severely dilated. Wall thickness was normal. Systolic function was normal. ------------------------------------------------------------------- Pulmonic valve: Doppler: Transvalvular velocity was within the normal range. There was no evidence for stenosis. There was mild regurgitation. ------------------------------------------------------------------- Tricuspid valve: Structurally  normal valve. Doppler: Transvalvular velocity was within the normal range. There was mild regurgitation. ------------------------------------------------------------------- Pulmonary artery: The main pulmonary artery was normal-sized. Systolic pressure was within the normal range. ------------------------------------------------------------------- Right atrium: The atrium was moderately dilated. ------------------------------------------------------------------- Pericardium: The pericardium was normal in appearance. There was no pericardial  effusion. ------------------------------------------------------------------- Systemic veins: Inferior vena cava: The vessel was dilated. The respirophasic diameter changes were blunted (< 50%), consistent with elevated central venous pressure. ------------------------------------------------------------------- Post procedure conclusions Ascending Aorta:  - The aorta was normal, not dilated, and non-diseased. ------------------------------------------------------------------- Measurements  Left ventricle Value Reference LV ID, ED, PLAX chordal 48.4 mm 43 - 52 LV ID, ES, PLAX chordal 35.1 mm 23 - 38 LV fx shortening, PLAX chordal (L) 27 % >=29 LV PW thickness, ED 13.4 mm --------- IVS/LV PW ratio, ED 1.01 <=1.3 LV e&', medial 8.01 cm/s --------- LV E/e&', medial 11.21 ---------  Ventricular septum Value Reference IVS thickness, ED 13.6 mm ---------  LVOT Value Reference LVOT ID, S 23 mm --------- LVOT area 4.15 cm^2 ---------  Aorta  Value Reference Aortic root ID, ED 32 mm ---------  Left atrium Value Reference LA ID, A-P, ES 45 mm --------- LA ID/bsa, A-P 1.59 cm/m^2 <=2.2 LA volume, ES, 1-p A4C 58 ml --------- LA volume/bsa, ES, 1-p A4C 20.5 ml/m^2 ---------  Mitral valve Value Reference Mitral E-wave peak velocity 89.8 cm/s --------- Mitral A-wave peak velocity 67.6 cm/s --------- Mitral deceleration time (H) 289 ms 150 - 230 Mitral peak gradient, D 3 mm Hg --------- Mitral E/A ratio, peak 1.3 ---------  Pulmonary arteries Value Reference PA pressure, S, DP (H) 55 mm Hg <=30  Tricuspid valve Value Reference Tricuspid regurg peak velocity 318 cm/s --------- Tricuspid peak RV-RA gradient 40 mm Hg ---------  Systemic veins Value Reference Estimated CVP 15 mm Hg ---------  Right ventricle Value Reference RV pressure, S, DP (H) 55 mm Hg <=30 RV s&', lateral, S 11.3 cm/s ---------  Legend: (L) and (H) mark values outside specified reference range.  Admission HPI: Ms. Bowmer is a 50 year old female with a past medical history significant for severe obesity (BMI 80) s/p gastric bypass in early 2000s, CAD s/p MI in 2003, DVT s/p one year of coumadin, anemia, Fe deficiency, B12 deficiency, possible CHF (EF 6-65% 11/2014) presents with acute onset chest pain and shortness of breath. Patient reports that she was woken up this morning at around 2 am with sharp-stabbing right  sided chest pain. No exacerbating or relieving factors. Reports that pain began in her right arm and radiated to her right chest. Does reports shortness of breath but notes she has had shortness of breath for the past week. Does note increased lower extremity edema for the the past week. Reports this does not feel like her prior MI. Does note headache, mainly occipital radiating across right side of her head. Describes as pounding. Improved in the ED. Denies any nausea, vomiting, abdominal pain, no jaw/neck pain, vision changes.   She took 2 aspirin 81 mg this morning when her chest pain started, which did not help with the pain. In the ED she was given azithromycin 500 mg, ceftriaxone 1 mg, and morphine 8 mg. She says the morphine helped with both her headache and her chest pain. She no longer takes her medications as she does not have insurance, but she does take over the counter iron supplements every other day and Tylenol as needed for her arthritis. She recently presented to the ED in April for anemia resulting from heavy menses, requiring 2 units of pRBCs. At that time she was found to have elevated intrinsic factor antibodies, low B12, and low iron levels.  Hospital Course by problem list:  Acute Hypoxic Respiratory Failure 2/2 heart failure exacerbation: Ms. Olazabal was admitted weighing 399 lbs up from  a baseline around 368. Significant new pedal edema and pulmonary crackles were noted. She required supplemental oxygen by nasal cannula at first. 80mb IV lasix given BID resulted in a brisk response of approximately 3 liters per day negative fluid balance. After 1 day she was breathing comfortably on room air. By day 3 diuresis was limited by mild hypotension, likely from preload dependency given her right sided heart failure. She transitioned to maintenance dose of 40mg  lasix PO daily without worsening of respiratory or hemodynamic status.  Pulmonary HTN in setting of obesity hypoventilation syndrome:  ABG was obtained showing pCO2 of 57.3 on admission with a CO2 on Bmet of 32. Consistent with respiratory acidosis with metaoblic compensation. ECHO was obtained showing a w/ mean PA pressure of and cor pulmonale. Given this most likely a class 3 pulmonary hypertension with moderately elevated PA HTN cardiac angiography was deferred. She was placed on bipap at night for treatment of her pulmonary disease.  Atypical Chest Pain: Initially bilateral axillary fluid collections were identified on upper extremity ultrasound and chest CT. Imaging was reviewd with interventional radiology service and felt to be consistent with accumulated body wall edema. Symptoms initially treated with Tylenol #3 and later meloxicam. Pain improved during her diuresis and no plan to aspirate or further evaluated R axillary fluid collection at this time.  CAD s/p MI (2003): Troponins and EKG were evaluated without evidence of ischemic changes. Continued therapy on per PTA dose of ASA 162 mg.  Pernicious Anemia and Iron Deficiency Anemia: Recent admission in 07/2015 for anemia requiring 2 U pRBCs without an identified active GI bleed. At that time she had low B12 and ferritin of 4 and elevated intrinsic factor antibody. History of gastric bypass. She was restarted on ferrous sulfate supplementation daily, B12 injection 1000 mcg daily, and vitamin D3 10000U daily. Hgb was not followed closely due to no active bleeding and treatment response will take weeks.  Discharge Vitals:   BP 108/69 mmHg  Pulse 82  Temp(Src) 98 F (36.7 C) (Oral)  Resp 18  Ht 4\' 9"  (1.448 m)  Wt 175.451 kg (386 lb 12.8 oz)  BMI 83.68 kg/m2  SpO2 93%  LMP 09/03/2015 (Approximate)  Discharge Labs:  Results for orders placed or performed during the hospital encounter of 09/22/15 (from the past 24 hour(s))  Basic metabolic panel     Status: Abnormal   Collection Time: 09/27/15  5:50 AM  Result Value Ref Range   Sodium 138 135 - 145 mmol/L    Potassium 3.8 3.5 - 5.1 mmol/L   Chloride 97 (L) 101 - 111 mmol/L   CO2 36 (H) 22 - 32 mmol/L   Glucose, Bld 101 (H) 65 - 99 mg/dL   BUN 10 6 - 20 mg/dL   Creatinine, Ser 1.61 0.44 - 1.00 mg/dL   Calcium 8.3 (L) 8.9 - 10.3 mg/dL   GFR calc non Af Amer >60 >60 mL/min   GFR calc Af Amer >60 >60 mL/min   Anion gap 5 5 - 15    Signed: Fuller Plan, MD 09/27/2015, 3:54 PM   Services Ordered on Discharge: Pending continued rehab progress Equipment Ordered on Discharge: Pending continued rehab progress

## 2015-09-27 NOTE — Evaluation (Signed)
Occupational Therapy Evaluation and Discharge Patient Details Name: Dana Abbott MRN: [id number] DOB: 01/30/1966 Today's Date: 09/27/2015    History of Present Illness Pt is a 50 y/o F presenting for acute hypoxic respiratory failure (CHF exacerbation vs. obesity hypoventilation).  Pt's PMH includes severe obesity s/p gastric bypass, CAD s/p MI, pernicious anemia, iron deficiency anemia, DVT, CHF.     Clinical Impression   This 50 yo female admitted with above presents to acute OT with deficits below (see OT problem list) thus affecting her PLOF of being independent with basic ADLs and IADLs. She will benefit from continued OT on CIR with plans now to D/C to that venue today. Acute OT will sign off.    Follow Up Recommendations  CIR    Equipment Recommendations   (TBD at next venue)       Precautions / Restrictions Precautions Precautions: Fall Restrictions Weight Bearing Restrictions: No      Mobility Bed Mobility Overal bed mobility: Needs Assistance Bed Mobility: Supine to Sit;Sit to Supine     Supine to sit: Min guard;HOB elevated (use of rail and sheet to help move RLE) Sit to supine: Min assist (LLE, used sheet for RLE to get it back up into bed)   General bed mobility comments: assist for R LE     Balance Overall balance assessment: Needs assistance Sitting-balance support: No upper extremity supported;Feet supported Sitting balance-Leahy Scale: Good          ADL Overall ADL's : Needs assistance/impaired Eating/Feeding: Independent;Sitting   Grooming: Set up;Sitting   Upper Body Bathing: Set up;Sitting   Lower Body Bathing: Maximal assistance;Bed level   Upper Body Dressing : Set up;Sitting   Lower Body Dressing: Total assistance;Bed level     Toilet Transfer Details (indicate cue type and reason): did not attempt due to standing needs to be attempted with +2 A for safety of pt and therapist           General ADL Comments: educated pt on  how to use sheet to help move her RLE in bed and in/out of bed               Pertinent Vitals/Pain Pain Assessment: 0-10 Pain Score: 8  Pain Location: right knee with movement, but then subsides when at rest Pain Descriptors / Indicators: Aching;Sore;Tightness Pain Intervention(s): Monitored during session;Repositioned;Heat applied     Hand Dominance Left   Extremity/Trunk Assessment Upper Extremity Assessment Upper Extremity Assessment: Overall WFL for tasks assessed              Cognition Arousal/Alertness: Awake/alert Behavior During Therapy: WFL for tasks assessed/performed Overall Cognitive Status: Within Functional Limits for tasks assessed                                Home Living Family/patient expects to be discharged to:: Inpatient rehab                                             OT Diagnosis: Generalized weakness;Acute pain   OT Problem List: Decreased strength;Decreased range of motion;Obesity;Decreased knowledge of use of DME or AE;Impaired balance (sitting and/or standing);Pain      OT Goals(Current goals can be found in the care plan section) Acute Rehab OT Goals Patient Stated Goal: to get up and moving again  OT Frequency:                End of Session    Activity Tolerance: Patient tolerated treatment well Patient left: in bed;with call bell/phone within reach   Time: 1324-1401 OT Time Calculation (min): 37 min Charges:  OT General Charges $OT Visit: 1 Procedure OT Evaluation $OT Eval Moderate Complexity: 1 Procedure OT Treatments $Therapeutic Activity: 8-22 mins  Evette Georges [phone] 09/27/2015, 3:22 PM

## 2015-09-27 NOTE — Progress Notes (Signed)
Ankit Karis Juba, MD Physician Signed Physical Medicine and Rehabilitation Consult Note 09/27/2015 8:18 AM  Related encounter: ED to Hosp-Admission (Discharged) from 09/22/2015 in [facility] 3E CHF    Expand All Collapse All        Physical Medicine and Rehabilitation Consult  Reason for Consult: Debility Referring Physician: Dr. Criselda Peaches.    HPI: Dana Abbott is a 50 y.o. female with history of severe obesity s/p gastric bypass, exertional dyspnea, CHF, non-obstructive CAD s/p MI 2003, recent admission 07/2015 for CP, SOB due to ABLA and acute exacerbation of CHF. She was readmitted on 09/22/15 with progressive SOB, CP and BLE edema. BLE negative for DVT and Chest CT negative for PE. Blood cultures and UCS negative for infection. She was treated with IV diuresis for management of acute respiratory failure due to CHF exacerbation and in setting of OHS. She was placed on B12 injections due to recent diagnosis of pernicious anemia. 2 D echo showed EF 55% with no wall abnormality, mild LVH, moderately dilated RA and elevated pulmonary pressures. Has declined nutritional education regarding weight loss and has been educated on importance on vitamin supplementation. She continues to be hypoxic requiring supplemental oxygen and noted to be severely debilitated. Therapy ongoing and CIR recommended by MD and rehab team.   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 Has severe OA right knee with limited standing/activity tolerance at baseline--can stand for 10 minutes at a time. Has not sat up in chair yet but has been sitting at EOB for 30 minutes independently a few times a day this weekend. Lives with daughter who helps with housework, but works during the day.   Review of Systems  HENT: Negative for hearing loss.  Eyes: Negative for blurred vision and double vision.  Respiratory: Positive for shortness of breath. Negative for cough.    Cardiovascular: Positive for leg swelling. Negative for chest pain and palpitations.  Gastrointestinal: Positive for constipation. Negative for heartburn, nausea and vomiting.  Genitourinary: Negative for dysuria and urgency.  Musculoskeletal: Positive for myalgias and joint pain.  Skin: Negative for itching and rash.  Neurological: Positive for focal weakness and weakness. Negative for dizziness, speech change and headaches.  Psychiatric/Behavioral: The patient is not nervous/anxious and does not have insomnia.  All other systems reviewed and are negative.     Past Medical History  Diagnosis Date  . CHF (congestive heart failure) (HCC)   . Anemia   . DVT (deep venous thrombosis) (HCC) "after my heart attack"    "one of my legs"  . Heavy menses   . Myocardial infarction [facility]) 2003?  Marland Kitchen History of blood transfusion "I've had alot"    "I'm anemic"  . Arthritis     "knees" (07/31/2015)    Past Surgical History  Procedure Laterality Date  . Cesarean section  1985; 1991; 1993  . Gastric bypass  2004  . Tonsillectomy    . Carpal tunnel release Right   . Cholecystectomy  2004    w/gastric OR    Family History  Problem Relation Age of Onset  . Stroke Mother   . Hypertension Mother   . COPD Father   . Cancer Neg Hx   . Diabetes type II Daughter     Social History: Lives with family. Independent and has been using a walker for the past month without AD. She reports that she has never smoked. She has never used smokeless tobacco. She reports that she  does not drink alcohol or use illicit drugs.    Allergies  Allergen Reactions  . Shellfish Allergy     Rash     Medications Prior to Admission  Medication Sig Dispense Refill  . Acetaminophen (TYLENOL 8 HOUR ARTHRITIS PAIN PO) Take 1,300 mg by mouth every 8 (eight) hours as needed (arthritis pain).    Marland Kitchen  aspirin 81 MG chewable tablet Chew 162 mg by mouth once.    . docusate sodium (COLACE) 100 MG capsule Take 1 capsule (100 mg total) by mouth 2 (two) times daily. (Patient not taking: Reported on 08/28/2015) 10 capsule 0  . ferrous sulfate 325 (65 FE) MG tablet Take 1 tablet (325 mg total) by mouth 3 (three) times daily with meals. (Patient not taking: Reported on 09/22/2015) 90 tablet 0  . furosemide (LASIX) 20 MG tablet Take 1 tablet (20 mg total) by mouth daily. (Patient not taking: Reported on 09/22/2015) 5 tablet 0  . hydrochlorothiazide (HYDRODIURIL) 25 MG tablet Take 1 tablet (25 mg total) by mouth daily. 30 tablet 0  . HYDROcodone-acetaminophen (NORCO/VICODIN) 5-325 MG tablet Take 1-2 tablets by mouth every 6 (six) hours as needed. (Patient not taking: Reported on 09/22/2015) 10 tablet 0  . Potassium Chloride ER 20 MEQ TBCR Take 20 mEq by mouth daily. (Patient not taking: Reported on 09/22/2015) 5 tablet 0    Home: Home Living Family/patient expects to be discharged to:: Private residence Living Arrangements: Children, Other relatives (daughter and grandson) Available Help at Discharge: Family, Available PRN/intermittently (daughter goes to work during the days) Type of Home: Apartment Home Access: Level entry Home Layout: One level Bathroom Shower/Tub: Hydrographic surveyor, Curtain, Sport and exercise psychologist: Standard Home Equipment: Grab bars - toilet, Grab bars - tub/shower, Environmental consultant - 4 wheels, Cane - single point  Functional History: Prior Function Level of Independence: Independent with assistive device(s) Comments: Uses RW to ambulate household distances, uses RW due to arthritis pain in Rt knee. Uses scooter in grocery store. For tub/shower transfer she sits on side of tub, turns and then stands in tub/shower. Had two falls in the past 6 months from losing her footing. Functional Status:  Mobility: Bed Mobility Overal bed mobility: + 2 for  safety/equipment Bed Mobility: Supine to Sit Supine to sit: Min assist, Max assist Sit to supine: Max assist, +2 for safety/equipment, +2 for physical assistance General bed mobility comments: max assist to get hips moved to edge of bed using pad under pt's hips (she and mats were stuck to matttress, pt providing max effort to assist). once pt's hips were moved laterally to edge of bed, pt was min assist to sit up at the edge with bed flat and rails used. Increased time needed due to right leg pain with movement. Transfers Overall transfer level: Needs assistance Equipment used: 2 person hand held assist Transfers: Sit to/from Stand Sit to Stand: +2 safety/equipment, +2 physical assistance General transfer comment: sit<>stand was attempted x 2 from bed:1st rep pt unable to clear buttocks from bed surface, 2cd attempt pt was able to barely clear buttocks from bed surfaces. Both times pt was limited by right knee pain with weight bearing. Increased time needed.       ADL:    Cognition: Cognition Overall Cognitive Status: Within Functional Limits for tasks assessed Cognition Arousal/Alertness: Awake/alert Behavior During Therapy: WFL for tasks assessed/performed Overall Cognitive Status: Within Functional Limits for tasks assessed    Blood pressure 99/58, pulse 75, temperature 98.7 F (37.1 C), temperature source Oral,  resp. rate 20, height 4\' 9"  (1.448 m), weight 175.451 kg (386 lb 12.8 oz), last menstrual period 09/03/2015, SpO2 96 %. Physical Exam  Nursing note and vitals reviewed. Constitutional: She is oriented to person, place, and time. She appears well-developed.  Obese  HENT:  Head: Normocephalic and atraumatic.  Mouth/Throat: Oropharynx is clear and moist.  Eyes: Conjunctivae and EOM are normal. Pupils are equal, round, and reactive to light.  Neck: Normal range of motion. Neck supple.  Cardiovascular: Normal rate and regular rhythm.   Respiratory: Effort normal. She has decreased breath sounds in the right lower field and the left lower field.  GI: Soft. Bowel sounds are normal. She exhibits no distension. There is no tenderness.  Musculoskeletal: She exhibits tenderness.  Lymphedema.  RLE limited due to pain inhibition-unable SLR or tolerate any attempts at ROM at knee.  Neurological: She is alert and oriented to person, place, and time.  Sensation intact to light touch Motor: B/l UE: 5/5 proximal to distal RLE: Hip flexion 2+/5, knee extension 3-/5, ankle dorsi/plantar flexion 4+/5 LLE: Hip flexion, knee extension 4+/5, ankle doris/plantar flexion 5/5  Skin: Skin is warm and dry.  Psychiatric: She has a normal mood and affect. Her behavior is normal. Judgment and thought content normal.     Lab Results Last 24 Hours    Results for orders placed or performed during the hospital encounter of 09/22/15 (from the past 24 hour(s))  Basic metabolic panel Status: Abnormal   Collection Time: 09/27/15 5:50 AM  Result Value Ref Range   Sodium 138 135 - 145 mmol/L   Potassium 3.8 3.5 - 5.1 mmol/L   Chloride 97 (L) 101 - 111 mmol/L   CO2 36 (H) 22 - 32 mmol/L   Glucose, Bld 101 (H) 65 - 99 mg/dL   BUN 10 6 - 20 mg/dL   Creatinine, Ser 1.61 0.44 - 1.00 mg/dL   Calcium 8.3 (L) 8.9 - 10.3 mg/dL   GFR calc non Af Amer >60 >60 mL/min   GFR calc Af Amer >60 >60 mL/min   Anion gap 5 5 - 15      Imaging Results (Last 48 hours)    No results found.    Assessment/Plan: Diagnosis: Debility Labs and images independently reviewed. Records reviewed and summated above.  1. Does the need for close, 24 hr/day medical supervision in concert with the patient's rehab needs make it unreasonable for this patient to be served in a less intensive setting? Potentially 2. Co-Morbidities requiring supervision/potential complications: severe obesity s/p gastric bypass Body mass  index is 83.68 kg/(m^2)., diet and exercise education, encourage weight loss to increase endurance and promote overall health), exertional dyspnea (monitor RR and O2 says with increased activity), CHF (Monitor in accordance with increased physical activity and avoid UE resistance excercises), non-obstructive CAD s/p MI 2003 (cont meds), ABLA (transfuse if necessary to ensure appropriate perfusion for increased activity tolerance), pernicious anemia (see above), supplemental oxygen (cont as needed), severe OA right knee (ensure pain does not limit therapies), hypotension (monitor BP with increased activity) 3. Due to disease management, pain management and patient education, does the patient require 24 hr/day rehab nursing? Potentially 4. Does the patient require coordinated care of a physician, rehab nurse, PT (1-2 hrs/day, 5 days/week) and OT (1-2 hrs/day, 5 days/week) to address physical and functional deficits in the context of the above medical diagnosis(es)? Potentially Addressing deficits in the following areas: balance, endurance, locomotion, strength, transferring, dressing, toileting and psychosocial support 5. Can the  patient actively participate in an intensive therapy program of at least 3 hrs of therapy per day at least 5 days per week? Potentially 6. The potential for patient to make measurable gains while on inpatient rehab is excellent 7. Anticipated functional outcomes upon discharge from inpatient rehab are supervision and min assist with PT, supervision and min assist with OT, n/a with SLP. 8. Estimated rehab length of stay to reach the above functional goals is: 13-17 days. 9. Does the patient have adequate social supports and living environment to accommodate these discharge functional goals? Potentially 10. Anticipated D/C setting: TBD 11. Anticipated post D/C treatments: HH therapy and Home excercise program 12. Overall Rehab/Functional Prognosis: good  RECOMMENDATIONS: This  patient's condition is appropriate for continued rehabilitative care in the following setting: Will inquire about availability of caregiver support at discharge. Possibly CIR.  Patient has agreed to participate in recommended program. Potentially Note that insurance prior authorization may be required for reimbursement for recommended care.  Comment: Rehab Admissions Coordinator to follow up.  Maryla Morrow, MD 09/27/2015       Revision History     Date/Time User Provider Type Action   09/27/2015 10:45 AM Ankit Karis Juba, MD Physician Sign   09/27/2015 9:41 AM Jacquelynn Cree, PA-C Physician Assistant Share   View Details Report       Routing History     Date/Time From To Method   09/27/2015 10:45 AM Ankit Karis Juba, MD [facility] Fax

## 2015-09-27 NOTE — Progress Notes (Signed)
1650 Pt transferring to In house unit . Report given to Debb RN . Transfeered by NTs Familily aware of transfer

## 2015-09-27 NOTE — Progress Notes (Signed)
Subjective: Feeling good overall this morning but continues to have right knee pain. Transitioned to oral diuretics. Sleeping with bipap without difficulty. Blood pressure continues to be slightly soft but not having symptoms  Objective: Vital signs in last 24 hours: Filed Vitals:   09/26/15 1232 09/26/15 2038 09/26/15 2104 09/27/15 0612  BP: 101/76 95/53  99/58  Pulse: 85 96 80 75  Temp: 98 F (36.7 C) 98.2 F (36.8 C)  98.7 F (37.1 C)  TempSrc: Oral Oral  Oral  Resp: 20 18 18 20   Height:      Weight:    175.451 kg (386 lb 12.8 oz)  SpO2: 96% 96%     Weight change:   Intake/Output Summary (Last 24 hours) at 09/27/15 1029 Last data filed at 09/27/15 0904  Gross per 24 hour  Intake    940 ml  Output   1425 ml  Net   -485 ml   GENERAL- morbidly obese woman lying in bed in NAD CARDIAC- RRR, no murmurs, rubs or gallops RESP- Distant breath sounds, diminished air movement in bases EXTREMITIES- lymphedema bilaterally, 1+ edema on exam today, mild tenderness over right knee SKIN- Perioral skin irritation without breakdown PSYCH- Normal mood and affect, appropriate thought content and speech  Lab Results: Basic Metabolic Panel:  Recent Labs Lab 09/26/15 0317 09/27/15 0550  NA 139 138  K 3.8 3.8  CL 95* 97*  CO2 34* 36*  GLUCOSE 88 101*  BUN 8 10  CREATININE 0.59 0.62  CALCIUM 7.8* 8.3*   CBC:  Recent Labs Lab 09/23/15 0250 09/25/15 0257  WBC 4.1 4.8  HGB 8.3* 8.6*  HCT 32.3* 32.4*  MCV 83.5 81.4  PLT 238 193   Anemia Panel:  Recent Labs Lab 09/23/15 0250  FOLATE 10.2   Studies/Results: No results found. Medications: I have reviewed the patient's current medications. Scheduled Meds: . aspirin EC  81 mg Oral Daily  . cholecalciferol  5,000 Units Oral BID  . cyanocobalamin  1,000 mcg Intramuscular Daily  . docusate sodium  100 mg Oral BID  . enoxaparin (LOVENOX) injection  80 mg Subcutaneous Q24H  . ferrous sulfate  325 mg Oral TID WC  .  furosemide  40 mg Oral Daily  . meloxicam  15 mg Oral Daily  . multivitamin  1 tablet Oral Daily   Continuous Infusions:  PRN Meds:.acetaminophen Assessment/Plan:  Acute Hypoxic Respiratory Failure 2/2 heart failure exacerbation: Adequately diuresed with much improved weight and oxygenation. Weight increase reported today likely confounded with chage of bed. -Continue lasix at 40mg  PO daily -Daily bmet on inpatient diuretics  Pulmonary HTN in setting of obesity hypoventilation syndrome: ECHO this admission w/ mean PA pressure of and cor pulmonale likely from OHS. Class PA HTN moderate severity not indication for cardiac angiography. Sleeping well on bipap. -qHS bipap  CAD s/p MI (2003), unspecified heart failure: History of CAD with MI in 2003. New TTE 09/23/15 showing LVEF of 55%, significant right ventricle and atrium enlargement with estimated max PA pressure of . Aggressive risk factor modification limited by borderline BP with her right heart failure. -ASA 81 mg daily  Pernicious Anemia and Iron Deficiency Anemia: Recent admission in 07/2015 for anemia requiring 2 U pRBCs. At that time she had low B12 and ferritin of 4. Elevated intrinsic factor antibody in 07/2015. History of gastric bypass. -B12 injection 1000 mcg, daily, on discharge will transition to sublingual vit b12 qd -Ferrous sulfate 325 mg daily, adequacy of therapy to  be followed in outpatient setting -Vitamin D3 5000IU BID, to be reassessed approximately 11/23/15 for 2 months Rx -  calcium citrate  FULL CODE Diet: Heart healthy VTE ppx: [place] enoxaparin  Dispo: Patient needs placement for inpatient rehabilitation ideally through CIR or SNF due to deconditioning on top of baseline low functional reserve adn morbid obesity.  The patient does have a current PCP [facility]) and does need an [facility] hospital follow-up appointment after discharge.  The patient does not have transportation limitations  that hinder transportation to clinic appointments.   LOS: 5 days   Fuller Plan, MD 09/27/2015, 10:29 AM

## 2015-09-27 NOTE — Progress Notes (Signed)
Inpatient Rehabilitation  Dr. Dimple Casey has given medical clearance for me to admit pt. To CIR today.  I have updated pt's RN as well as Jiles Crocker, RNCM via text message.  Please call if questions.  Weldon Picking PT Inpatient Rehab Admissions Coordinator Cell [phone] Office [phone]

## 2015-09-27 NOTE — Progress Notes (Signed)
Inpatient Rehabilitation  I met with the patient at the bedside and spoke to her son Sherlene Shams over the phone  to discuss recommendation for IP Rehab.  I provided informational booklets and answered pt's questions.  Pt. nnd son would like to move forward with IP Rehab.    I have placed a call to pt's attending and await their response.  Please call if questions.  [place] Admissions Coordinator Cell [phone] Office [phone]

## 2015-09-27 NOTE — Progress Notes (Signed)
Physical Therapy Treatment Patient Details Name: Dana Abbott MRN: [id number] DOB: 07/11/1965 Today's Date: 09/27/2015    History of Present Illness Pt is a 50 y/o F presenting for acute hypoxic respiratory failure (CHF exacerbation vs. obesity hypoventilation).  Pt's PMH includes severe obesity s/p gastric bypass, CAD s/p MI, pernicious anemia, iron deficiency anemia, DVT, CHF.      PT Comments    Patient progressing with tolerance to activity and less assist required, but still very limited by R knee pain.  Feel continued skilled PT in the acute setting and follow up CIR level therapies indicated to progress pt back to previous level of function for d/c home.  Follow Up Recommendations  CIR     Equipment Recommendations  Other (comment) (TBA)    Recommendations for Other Services       Precautions / Restrictions Precautions Precautions: Fall    Mobility  Bed Mobility Overal bed mobility: Needs Assistance       Supine to sit: Min assist     General bed mobility comments: assist for R LE  Transfers Overall transfer level: Needs assistance Equipment used: 4-wheeled walker (bariatric) Transfers: Sit to/from Stand Sit to Stand: +2 physical assistance;Min assist         General transfer comment: assist for safety, pt uses momentum and pulls up on rollator, limited tolerance to standing and attempted x 3, stood x 2, third attempt too painful  Ambulation/Gait                 Stairs            Wheelchair Mobility    Modified Rankin (Stroke Patients Only)       Balance Overall balance assessment: Needs assistance   Sitting balance-Leahy Scale: Good     Standing balance support: Bilateral upper extremity supported Standing balance-Leahy Scale: Poor Standing balance comment: UE support and min A for balance, unable to straighten fully in standing due to knee pain, stood maybe 10 sec max over two trials                    Cognition  Arousal/Alertness: Awake/alert Behavior During Therapy: WFL for tasks assessed/performed Overall Cognitive Status: Within Functional Limits for tasks assessed                      Exercises General Exercises - Lower Extremity Ankle Circles/Pumps: AROM;Both;20 reps;Supine Quad Sets: AROM;Both;5 reps;Supine Long Arc Quad: AAROM;Right;5 reps;Seated (x 2 sets) Heel Slides: AROM;AAROM;Both;5 reps;Supine    General Comments        Pertinent Vitals/Pain Pain Score: 8  Pain Location: R knee wtih standing Pain Descriptors / Indicators: Aching;Tightness Pain Intervention(s): Repositioned;Monitored during session;Limited activity within patient's tolerance    Home Living                      Prior Function            PT Goals (current goals can now be found in the care plan section) Progress towards PT goals: Progressing toward goals    Frequency  Min 3X/week    PT Plan Current plan remains appropriate    Co-evaluation             End of Session Equipment Utilized During Treatment: Gait belt Activity Tolerance: Patient limited by pain Patient left: in bed;with call bell/phone within reach;with nursing/sitter in room (sitting EOB)     Time: 4098-1191 PT Time Calculation (min) (ACUTE ONLY):  30 min  Charges:  $Therapeutic Exercise: 8-22 mins $Therapeutic Activity: 8-22 mins                    G Codes:      Elray Mcgregor 10/17/15, 1:26 PM  Sheran Lawless, [place] [phone] October 17, 2015

## 2015-09-27 NOTE — Consult Note (Signed)
Physical Medicine and Rehabilitation Consult  Reason for Consult: Debility Referring Physician: Dr. Criselda Peaches.    HPI: Dana Abbott is a 50 y.o. female with history of severe obesity s/p gastric bypass, exertional dyspnea, CHF, non-obstructive CAD s/p MI 2003, recent admission 07/2015 for CP, SOB due to ABLA and acute exacerbation of CHF.  She was readmitted on 09/22/15 with progressive SOB, CP and BLE edema. BLE negative for DVT and Chest CT negative for PE.  Blood cultures and UCS negative for infection. She was treated with IV diuresis for management of acute respiratory failure due to CHF exacerbation and in setting of OHS. She was placed on  B12 injections due to recent diagnosis of pernicious anemia. 2 D echo showed EF 55% with no wall abnormality, mild LVH, moderately dilated RA and elevated pulmonary pressures. Has declined nutritional education regarding weight loss and has been educated on importance on vitamin supplementation.  She continues to be hypoxic requiring supplemental oxygen and noted to be severely debilitated. Therapy ongoing and CIR recommended by MD and rehab team.     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  Has severe OA right knee with limited standing/activity tolerance at baseline--can stand for 10 minutes at a time. Has not sat up in chair yet but has been sitting at EOB for 30 minutes independently a few times a day this weekend. Lives with daughter who helps with housework, but works during the day.    Review of Systems  HENT: Negative for hearing loss.   Eyes: Negative for blurred vision and double vision.  Respiratory: Positive for shortness of breath. Negative for cough.   Cardiovascular: Positive for leg swelling. Negative for chest pain and palpitations.  Gastrointestinal: Positive for constipation. Negative for heartburn, nausea and vomiting.  Genitourinary: Negative for dysuria and urgency.  Musculoskeletal:  Positive for myalgias and joint pain.  Skin: Negative for itching and rash.  Neurological: Positive for focal weakness and weakness. Negative for dizziness, speech change and headaches.  Psychiatric/Behavioral: The patient is not nervous/anxious and does not have insomnia.   All other systems reviewed and are negative.     Past Medical History  Diagnosis Date  . CHF (congestive heart failure) (HCC)   . Anemia   . DVT (deep venous thrombosis) (HCC) "after my heart attack"    "one of my legs"  . Heavy menses   . Myocardial infarction [facility]) 2003?  Marland Kitchen History of blood transfusion "I've had alot"    "I'm anemic"  . Arthritis     "knees" (07/31/2015)    Past Surgical History  Procedure Laterality Date  . Cesarean section  1985; 1991; 1993  . Gastric bypass  2004  . Tonsillectomy    . Carpal tunnel release Right   . Cholecystectomy  2004    w/gastric OR    Family History  Problem Relation Age of Onset  . Stroke Mother   . Hypertension Mother   . COPD Father   . Cancer Neg Hx   . Diabetes type II Daughter     Social History:   Lives with family. Independent and has been using a walker for the past month without AD.  She  reports that she has never smoked. She has never used smokeless tobacco. She reports that she does not drink alcohol or use illicit drugs.    Allergies  Allergen Reactions  . Shellfish Allergy     Rash  Medications Prior to Admission  Medication Sig Dispense Refill  . Acetaminophen (TYLENOL 8 HOUR ARTHRITIS PAIN PO) Take 1,300 mg by mouth every 8 (eight) hours as needed (arthritis pain).    Marland Kitchen aspirin 81 MG chewable tablet Chew 162 mg by mouth once.    . docusate sodium (COLACE) 100 MG capsule Take 1 capsule (100 mg total) by mouth 2 (two) times daily. (Patient not taking: Reported on 08/28/2015) 10 capsule 0  . ferrous sulfate 325 (65 FE) MG tablet Take 1 tablet (325 mg total) by mouth 3 (three) times daily with meals. (Patient not taking: Reported on  09/22/2015) 90 tablet 0  . furosemide (LASIX) 20 MG tablet Take 1 tablet (20 mg total) by mouth daily. (Patient not taking: Reported on 09/22/2015) 5 tablet 0  . hydrochlorothiazide (HYDRODIURIL) 25 MG tablet Take 1 tablet (25 mg total) by mouth daily. 30 tablet 0  . HYDROcodone-acetaminophen (NORCO/VICODIN) 5-325 MG tablet Take 1-2 tablets by mouth every 6 (six) hours as needed. (Patient not taking: Reported on 09/22/2015) 10 tablet 0  . Potassium Chloride ER 20 MEQ TBCR Take 20 mEq by mouth daily. (Patient not taking: Reported on 09/22/2015) 5 tablet 0    Home: Home Living Family/patient expects to be discharged to:: Private residence Living Arrangements: Children, Other relatives (daughter and grandson) Available Help at Discharge: Family, Available PRN/intermittently (daughter goes to work during the days) Type of Home: Apartment Home Access: Level entry Home Layout: One level Bathroom Shower/Tub: Hydrographic surveyor, Curtain, Sport and exercise psychologist: Standard Home Equipment: Grab bars - toilet, Grab bars - tub/shower, Environmental consultant - 4 wheels, Cane - single point  Functional History: Prior Function Level of Independence: Independent with assistive device(s) Comments: Uses RW to ambulate household distances, uses RW due to arthritis pain in Rt knee. Uses scooter in grocery store.  For tub/shower transfer she sits on side of tub, turns and then stands in tub/shower.  Had two falls in the past 6 months from losing her footing. Functional Status:  Mobility: Bed Mobility Overal bed mobility: + 2 for safety/equipment Bed Mobility: Supine to Sit Supine to sit: Min assist, Max assist Sit to supine: Max assist, +2 for safety/equipment, +2 for physical assistance General bed mobility comments: max assist to get hips moved to edge of bed using pad under pt's hips (she and mats were stuck to matttress, pt providing max effort to assist).  once pt's hips were moved laterally to edge of bed, pt was min assist to  sit up at the edge with bed flat and rails used. Increased time needed due to right leg pain with movement. Transfers Overall transfer level: Needs assistance Equipment used: 2 person hand held assist Transfers: Sit to/from Stand Sit to Stand: +2 safety/equipment, +2 physical assistance General transfer comment: sit<>stand was attempted x 2 from bed:1st rep pt unable to clear buttocks from bed surface, 2cd attempt pt was able to barely clear buttocks from bed surfaces. Both times pt was limited by right knee pain with weight bearing. Increased time needed.                                ADL:    Cognition: Cognition Overall Cognitive Status: Within Functional Limits for tasks assessed Cognition Arousal/Alertness: Awake/alert Behavior During Therapy: WFL for tasks assessed/performed Overall Cognitive Status: Within Functional Limits for tasks assessed    Blood pressure 99/58, pulse 75, temperature 98.7 F (37.1 C), temperature source Oral,  resp. rate 20, height 4\' 9"  (1.448 m), weight 175.451 kg (386 lb 12.8 oz), last menstrual period 09/03/2015, SpO2 96 %. Physical Exam  Nursing note and vitals reviewed. Constitutional: She is oriented to person, place, and time. She appears well-developed.  Obese  HENT:  Head: Normocephalic and atraumatic.  Mouth/Throat: Oropharynx is clear and moist.  Eyes: Conjunctivae and EOM are normal. Pupils are equal, round, and reactive to light.  Neck: Normal range of motion. Neck supple.  Cardiovascular: Normal rate and regular rhythm.   Respiratory: Effort normal. She has decreased breath sounds in the right lower field and the left lower field.  GI: Soft. Bowel sounds are normal. She exhibits no distension. There is no tenderness.  Musculoskeletal: She exhibits tenderness.  Lymphedema.   RLE limited due to pain inhibition-unable SLR or tolerate any attempts at ROM at knee.   Neurological: She is alert and oriented to person, place, and time.    Sensation intact to light touch Motor: B/l UE: 5/5 proximal to distal RLE: Hip flexion 2+/5, knee extension 3-/5, ankle dorsi/plantar flexion 4+/5 LLE: Hip flexion, knee extension 4+/5, ankle doris/plantar flexion 5/5  Skin: Skin is warm and dry.  Psychiatric: She has a normal mood and affect. Her behavior is normal. Judgment and thought content normal.    Results for orders placed or performed during the hospital encounter of 09/22/15 (from the past 24 hour(s))  Basic metabolic panel     Status: Abnormal   Collection Time: 09/27/15  5:50 AM  Result Value Ref Range   Sodium 138 135 - 145 mmol/L   Potassium 3.8 3.5 - 5.1 mmol/L   Chloride 97 (L) 101 - 111 mmol/L   CO2 36 (H) 22 - 32 mmol/L   Glucose, Bld 101 (H) 65 - 99 mg/dL   BUN 10 6 - 20 mg/dL   Creatinine, Ser 1.61 0.44 - 1.00 mg/dL   Calcium 8.3 (L) 8.9 - 10.3 mg/dL   GFR calc non Af Amer >60 >60 mL/min   GFR calc Af Amer >60 >60 mL/min   Anion gap 5 5 - 15   No results found.  Assessment/Plan: Diagnosis: Debility Labs and images independently reviewed.  Records reviewed and summated above.  1. Does the need for close, 24 hr/day medical supervision in concert with the patient's rehab needs make it unreasonable for this patient to be served in a less intensive setting? Potentially 2. Co-Morbidities requiring supervision/potential complications: severe obesity s/p gastric bypass Body mass index is 83.68 kg/(m^2)., diet and exercise education, encourage weight loss to increase endurance and promote overall health), exertional dyspnea (monitor RR and O2 says with increased activity), CHF (Monitor in accordance with increased physical activity and avoid UE resistance excercises), non-obstructive CAD s/p MI 2003 (cont meds), ABLA (transfuse if necessary to ensure appropriate perfusion for increased activity tolerance), pernicious anemia (see above), supplemental oxygen (cont as needed), severe OA right knee (ensure pain does not limit  therapies), hypotension (monitor BP with increased activity) 3. Due to disease management, pain management and patient education, does the patient require 24 hr/day rehab nursing? Potentially 4. Does the patient require coordinated care of a physician, rehab nurse, PT (1-2 hrs/day, 5 days/week) and OT (1-2 hrs/day, 5 days/week) to address physical and functional deficits in the context of the above medical diagnosis(es)? Potentially Addressing deficits in the following areas: balance, endurance, locomotion, strength, transferring, dressing, toileting and psychosocial support 5. Can the patient actively participate in an intensive therapy program of at least 3  hrs of therapy per day at least 5 days per week? Potentially 6. The potential for patient to make measurable gains while on inpatient rehab is excellent 7. Anticipated functional outcomes upon discharge from inpatient rehab are supervision and min assist  with PT, supervision and min assist with OT, n/a with SLP. 8. Estimated rehab length of stay to reach the above functional goals is: 13-17 days. 9. Does the patient have adequate social supports and living environment to accommodate these discharge functional goals? Potentially 10. Anticipated D/C setting: TBD 11. Anticipated post D/C treatments: HH therapy and Home excercise program 12. Overall Rehab/Functional Prognosis: good  RECOMMENDATIONS: This patient's condition is appropriate for continued rehabilitative care in the following setting: Will inquire about availability of caregiver support at discharge. Possibly CIR.  Patient has agreed to participate in recommended program. Potentially Note that insurance prior authorization may be required for reimbursement for recommended care.  Comment: Rehab Admissions Coordinator to follow up.  Maryla Morrow, MD 09/27/2015

## 2015-09-27 NOTE — PMR Pre-admission (Signed)
PMR Admission Coordinator Pre-Admission Assessment  Patient: Dana Abbott is an 50 y.o., female MRN: [id number] DOB: 05/09/1965 Height:  (144.8 cm) Weight: (!) 175.451 kg (386 lb 12.8 oz)              Insurance Information HMO:     PPO:      PCP:      IPA:      80/20:      OTHER:  PRIMARY:  Uninsured (Pt. acknowledges that she is uninsured and wishes to proceed with IP Rehab admission.   Financial counselor Angelique Blonder has initiated case with pt.)       Policy#:       Subscriber:  CM Name:       Phone#:      Fax#:  Pre-Cert#:       Employer:  Benefits:  Phone #:      Name:  Eff. Date:      Deduct:       Out of Pocket Max:       Life Max:  CIR:       SNF:  Outpatient:      Co-Pay:  Home Health:       Co-Pay:  DME:      Co-Pay:  Providers:  SECONDARY:       Policy#:       Subscriber:  CM Name:       Phone#:      Fax#:  Pre-Cert#:       Employer:  Benefits:  Phone #:      Name:  Eff. Date:      Deduct:       Out of Pocket Max:       Life Max:  CIR:       SNF:  Outpatient:      Co-Pay:  Home Health:       Co-Pay:  DME:      Co-Pay:   Medicaid Application Date:       Case Manager:  Disability Application Date:       Case Worker:   Emergency Contact Information Contact Information    Name Relation Home Work Mobile   [place] Mother   [phone]     Current Medical History  Patient Admitting Diagnosis: Debility History of Present Illness:  Dana Abbott is a 50 y.o. female with history of severe obesity s/p gastric bypass, exertional dyspnea, CHF, non-obstructive CAD s/p MI 2003, recent admission 07/2015 for CP, SOB due to ABLA and acute exacerbation of CHF. She was readmitted on 09/22/15 with progressive SOB, CP and BLE edema. BLE negative for DVT and Chest CT negative for PE. Blood cultures and UCS negative for infection. She was treated with IV diuresis for management of acute respiratory failure due to CHF exacerbation and in setting of OHS. She was placed on B12  injections due to recent diagnosis of pernicious anemia. 2 D echo showed EF 55% with no wall abnormality, mild LVH, moderately dilated RA and elevated pulmonary pressures. Has declined nutritional education regarding weight loss and has been educated on importance on vitamin supplementation. She continues to be hypoxic requiring supplemental oxygen and noted to be severely debilitated. Therapy ongoing and CIR recommended by MD and rehab team.   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 Has severe OA right knee with limited standing/activity tolerance at baseline--can stand for 10 minutes at a time. Has not sat up in chair yet but has been sitting  at EOB for 30 minutes independently a few times a day this weekend. Lives with daughter who helps with housework, but works during the day.Pt's son states he will assist his mom as much as needed.   Pt. Was admitted to IP Rehab on 09/27/15       Past Medical History  Past Medical History  Diagnosis Date  . CHF (congestive heart failure) (HCC)   . Anemia   . DVT (deep venous thrombosis) (HCC) "after my heart attack"    "one of my legs"  . Heavy menses   . Myocardial infarction [facility]) 2003?  Marland Kitchen History of blood transfusion "I've had alot"    "I'm anemic"  . Arthritis     "knees" (07/31/2015)    Family History  family history includes COPD in her father; Diabetes type II in her daughter; Hypertension in her mother; Stroke in her mother. There is no history of Cancer.  Prior Rehab/Hospitalizations:  Has the patient had major surgery during 100 days prior to admission? No  Current Medications   Current facility-administered medications:  .  acetaminophen (TYLENOL) tablet 650 mg, 650 mg, Oral, Q6H PRN, Fuller Plan, MD, 650 mg at 09/26/15 1559 .  aspirin EC tablet 81 mg, 81 mg, Oral, Daily, Denton Brick, MD, 81 mg at 09/27/15 1126 .  cholecalciferol (VITAMIN D) tablet 5,000 Units, 5,000 Units, Oral, BID,  Fuller Plan, MD, 5,000 Units at 09/27/15 1152 .  cyanocobalamin ((VITAMIN B-12)) injection 1,000 mcg, 1,000 mcg, Intramuscular, Daily, Denton Brick, MD, 1,000 mcg at 09/27/15 1127 .  docusate sodium (COLACE) capsule 100 mg, 100 mg, Oral, BID, Denton Brick, MD, 100 mg at 09/26/15 1049 .  enoxaparin (LOVENOX) injection 80 mg, 80 mg, Subcutaneous, Q24H, Belinda Fisher Stone, RPH, 80 mg at 09/27/15 1119 .  ferrous sulfate tablet 325 mg, 325 mg, Oral, TID WC, Denton Brick, MD, 325 mg at 09/27/15 1126 .  furosemide (LASIX) tablet 40 mg, 40 mg, Oral, Daily, Denton Brick, MD, 40 mg at 09/27/15 1120 .  meloxicam (MOBIC) tablet 15 mg, 15 mg, Oral, Daily, Fuller Plan, MD, 15 mg at 09/27/15 1127 .  multivitamin (PROSIGHT) tablet 1 tablet, 1 tablet, Oral, Daily, Denton Brick, MD, 1 tablet at 09/27/15 1126  Patients Current Diet: Diet Heart Room service appropriate?: Yes; Fluid consistency:: Thin  Precautions / Restrictions Precautions Precautions: Fall Restrictions Weight Bearing Restrictions: No   Has the patient had 2 or more falls or a fall with injury in the past year?No  Prior Activity Level Community (5-7x/wk): Up until a month ago, pt. states she was working full time. She states her place of employment was not handicap accessible and they llet her go a month ago.  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  Home Assistive Devices / Equipment Home Assistive Devices/Equipment: Gilmer Mor (specify quad or straight) Home Equipment: Grab bars - toilet, Grab bars - tub/shower, Walker - 4 wheels, Cane - single point   Prior Device Use: Indicate devices/aids used by the patient prior to current illness, exacerbation or injury? Walker  Prior Functional Level Prior Function Level of Independence: Independent with assistive device(s) Comments: Uses RW to ambulate household distances, uses RW due to arthritis pain in Rt knee. Uses scooter in grocery store.  For tub/shower  transfer she sits on side of tub, turns and then stands in tub/shower.  Had two falls in the past 6 months from losing her footing.  Self Care:  Did the patient need help bathing, dressing, using the toilet or eating?  Independent  Indoor Mobility: Did the patient need assistance with walking from room to room (with or without device)? Independent  Stairs: Did the patient need assistance with internal or external stairs (with or without device)? Independent  Functional Cognition: Did the patient need help planning regular tasks such as shopping or remembering to take medications? Independent  Current Functional Level Cognition  Overall Cognitive Status: Within Functional Limits for tasks assessed    Extremity Assessment (includes Sensation/Coordination)  Upper Extremity Assessment: Overall WFL for tasks assessed  Lower Extremity Assessment: RLE deficits/detail, LLE deficits/detail RLE Deficits / Details: limited assessment due to Rt knee pain.  Swelling Bil LEs.  Functionally, pt requires assist to mobilize Rt LE w/ bed mobility. RLE: Unable to fully assess due to pain LLE Deficits / Details: Not formally assessed.  Functionally, pt able to move Lt LE at mod I for supine>sit but requires assist against gravity w/ return to supine.    ADLs  Overall ADL's : Needs assistance/impaired Eating/Feeding: Independent, Sitting Grooming: Set up, Sitting Upper Body Bathing: Set up, Sitting Lower Body Bathing: Maximal assistance, Bed level Upper Body Dressing : Set up, Sitting Lower Body Dressing: Total assistance, Bed level Toilet Transfer Details (indicate cue type and reason): did not attempt due to standing needs to be attempted with +2 A for safety of pt and therapist General ADL Comments: educated pt on how to use sheet to help move her RLE in bed and in/out of bed    Mobility  Overal bed mobility: Needs Assistance Bed Mobility: Supine to Sit, Sit to Supine Supine to sit: Min guard, HOB  elevated (use of rail and sheet to help move RLE) Sit to supine: Min assist (LLE, used sheet for RLE to get it back up into bed) General bed mobility comments: assist for R LE    Transfers  Overall transfer level: Needs assistance Equipment used: 4-wheeled walker (bariatric) Transfers: Sit to/from Stand Sit to Stand: +2 physical assistance, Min assist General transfer comment: assist for safety, pt uses momentum and pulls up on rollator, limited tolerance to standing and attempted x 3, stood x 2, third attempt too painful    Ambulation / Gait / Stairs / Engineer, drilling / Balance Dynamic Sitting Balance Sitting balance - Comments: Bil UE support to maintain balance sitting EOB Balance Overall balance assessment: Needs assistance Sitting-balance support: No upper extremity supported, Feet supported Sitting balance-Leahy Scale: Good Sitting balance - Comments: Bil UE support to maintain balance sitting EOB Standing balance support: Bilateral upper extremity supported Standing balance-Leahy Scale: Poor Standing balance comment: UE support and min A for balance, unable to straighten fully in standing due to knee pain, stood maybe 10 sec max over two trials    Special needs/care consideration BiPAP/CPAP   Using Bipap nocturnally since admission  CPM    no Continuous Drip IV   no Dialysis     no     Life Vest   no Oxygen    Special Bed    no Trach Size   no Wound Vac (area)   no      Location    Skin   Per nursing assessment, pt. Has dry skin                              Bowel mgmt:  Last BM  09/22/15 per nursing assessment, continent Bladder mgmt: indwelling catheter Diabetic mgmt no     Previous Home Environment Living Arrangements: Children, Other relatives (daughter and grandson) Available Help at Discharge: Family, Available 24 hours/day (son confimrs availability 24/7) Type of Home: Apartment Home Layout: One level Home Access: Level entry Bathroom  Shower/Tub: Tub/shower unit, Curtain, Merchandiser, retail:  (pt. unsure) Home Care Services: No  Discharge Living Setting Plans for Discharge Living Setting: Patient's home Type of Home at Discharge: Apartment Discharge Home Layout: One level Discharge Home Access: Level entry Discharge Bathroom Shower/Tub: Tub/shower unit Discharge Bathroom Toilet: Standard Discharge Bathroom Accessibility:  (pt. unsure) Does the patient have any problems obtaining your medications?: No  Social/Family/Support Systems Patient Roles: Parent Anticipated Caregiver: pt's son Dana Abbott (pt. reports he does not answer the phone if her does not recognize the number). Pt. asks that we call pt's mom and she will convey  message to [place].  Mom is Dana Abbott Anticipated Caregiver's Contact Information: Dana Abbott, [phone] Ability/Limitations of Caregiver: son has just moved from [facility] and is not employed.  He is currently living with pt's mom, Dana Abbott but will stay with pt. 24/7 at time of DC Caregiver Availability: 24/7 Discharge Plan Discussed with Primary Caregiver: Yes Is Caregiver In Agreement with Plan?: Yes Does Caregiver/Family have Issues with Lodging/Transportation while Pt is in Rehab?: No   Goals/Additional Needs Patient/Family Goal for Rehab: supervision and min assist PT/OT, n/a SLP Expected length of stay: 13-17 days Cultural Considerations: none Dietary Needs: heart healthy, thin liquids Equipment Needs: TBA Pt/Family Agrees to Admission and willing to participate: Yes Program Orientation Provided & Reviewed with Pt/Caregiver Including Roles  & Responsibilities: Yes   Decrease burden of Care through IP rehab admission: n/a   Possible need for SNF placement upon discharge:   Not anticipated   Patient Condition: This patient's condition remains as documented in the consult dated 09/27/15 , in which the Rehabilitation  Physician determined and documented that the patient's condition is appropriate for intensive rehabilitative care in an inpatient rehabilitation facility. Will admit to inpatient rehab today.  Preadmission Screen Completed By:  Weldon Picking, 09/27/2015 3:53 PM ______________________________________________________________________   Discussed status with Dr.   Allena Katz on 09/27/15 at  1553  and received telephone approval for admission today.  Admission Coordinator:  Weldon Picking, time 0981 /Date 09/27/15

## 2015-09-27 NOTE — H&P (View-Only) (Signed)
Physical Medicine and Rehabilitation Admission H&P    Chief Complaint  Patient presents with  . debility    HPI:  Dana Abbott is a 50 y.o. female with history of severe obesity s/p gastric bypass, exertional dyspnea, CHF, non-obstructive CAD s/p MI 2003, recent admission 07/2015 for CP, SOB due to ABLA and acute exacerbation of CHF. She was readmitted on 09/22/15 with progressive SOB, CP and BLE edema. BLE negative for DVT and Chest CT negative for PE. Blood cultures and UCS negative for infection. She was treated with IV diuresis for management of acute respiratory failure due to CHF exacerbation and in setting of OHS. She was placed on B12 injections due to recent diagnosis of pernicious anemia. 2 D echo showed EF 55% with no wall abnormality, mild LVH, moderately dilated RA and elevated pulmonary pressures. Has declined nutritional education regarding weight loss and has been educated on importance on vitamin supplementation. She continues to be hypoxic requiring supplemental oxygen and noted to be severely debilitated. Therapy ongoing and CIR recommended by MD and rehab team.      Review of Systems  HENT: Negative for hearing loss.   Eyes: Negative for blurred vision and double vision.  Respiratory: Positive for shortness of breath. Negative for cough and sputum production.   Cardiovascular: Positive for leg swelling. Negative for chest pain and palpitations.  Gastrointestinal: Negative for heartburn, nausea, abdominal pain and constipation.  Genitourinary: Negative for dysuria.  Musculoskeletal: Positive for myalgias and joint pain.  Skin: Negative for itching.  Neurological: Positive for weakness. Negative for dizziness, tingling, sensory change, focal weakness and headaches.  Psychiatric/Behavioral: The patient has insomnia.   All other systems reviewed and are negative.     Past Medical History  Diagnosis Date  . CHF (congestive heart failure) ([place])   . Anemia     . DVT (deep venous thrombosis) ([place]) "after my heart attack"    "one of my legs"  . Heavy menses   . Myocardial infarction [facility]) 2003?  Marland Kitchen History of blood transfusion "I've had alot"    "I'm anemic"  . Arthritis     "knees" (07/31/2015)    Past Surgical History  Procedure Laterality Date  . Cesarean section  1985; 1991; 1993  . Gastric bypass  2004  . Tonsillectomy    . Carpal tunnel release Right   . Cholecystectomy  2004    w/gastric OR    Family History  Problem Relation Age of Onset  . Stroke Mother   . Hypertension Mother   . COPD Father   . Cancer Neg Hx   . Diabetes type II Daughter     Social History:  Lives with family.  Was working in collections till a month ago--could stand for about 10 minutes at a time due to pain. 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 Has walker for the past month. She reports that she has never smoked. She has never used smokeless tobacco. She reports that she does not drink alcohol or use illicit drugs.     Allergies  Allergen Reactions  . Shellfish Allergy     Rash     Medications Prior to Admission  Medication Sig Dispense Refill  . Acetaminophen (TYLENOL 8 HOUR ARTHRITIS PAIN PO) Take 1,300 mg by mouth every 8 (eight) hours as needed (arthritis pain).    Marland Kitchen aspirin 81 MG chewable tablet Chew 162 mg by mouth once.    . docusate sodium (COLACE) 100 MG  capsule Take 1 capsule (100 mg total) by mouth 2 (two) times daily. (Patient not taking: Reported on 08/28/2015) 10 capsule 0  . ferrous sulfate 325 (65 FE) MG tablet Take 1 tablet (325 mg total) by mouth 3 (three) times daily with meals. (Patient not taking: Reported on 09/22/2015) 90 tablet 0  . furosemide (LASIX) 20 MG tablet Take 1 tablet (20 mg total) by mouth daily. (Patient not taking: Reported on 09/22/2015) 5 tablet 0  . hydrochlorothiazide (HYDRODIURIL) 25 MG tablet Take 1 tablet (25 mg total) by mouth daily. 30 tablet 0  . HYDROcodone-acetaminophen (NORCO/VICODIN)  5-325 MG tablet Take 1-2 tablets by mouth every 6 (six) hours as needed. (Patient not taking: Reported on 09/22/2015) 10 tablet 0  . Potassium Chloride ER 20 MEQ TBCR Take 20 mEq by mouth daily. (Patient not taking: Reported on 09/22/2015) 5 tablet 0    Home: Home Living Family/patient expects to be discharged to:: Private residence Living Arrangements: Children, Other relatives (daughter and grandson) Available Help at Discharge: Family, Available PRN/intermittently (daughter goes to work during the days) Type of Home: Apartment Home Access: Level entry Home Layout: One level Bathroom Shower/Tub: Public librarian, Curtain, Charity fundraiser: Standard Home Equipment: Grab bars - toilet, Grab bars - tub/shower, Environmental consultant - 4 wheels, Cane - single point   Functional History: Prior Function Level of Independence: Independent with assistive device(s) Comments: Uses RW to ambulate household distances, uses RW due to arthritis pain in Rt knee. Uses scooter in grocery store.  For tub/shower transfer she sits on side of tub, turns and then stands in tub/shower.  Had two falls in the past 6 months from losing her footing.  Functional Status:  Mobility: Bed Mobility Overal bed mobility: Needs Assistance Bed Mobility: Supine to Sit Supine to sit: Min assist Sit to supine: Max assist, +2 for safety/equipment, +2 for physical assistance General bed mobility comments: assist for R LE Transfers Overall transfer level: Needs assistance Equipment used: 4-wheeled walker (bariatric) Transfers: Sit to/from Stand Sit to Stand: +2 physical assistance, Min assist General transfer comment: assist for safety, pt uses momentum and pulls up on rollator, limited tolerance to standing and attempted x 3, stood x 2, third attempt too painful      ADL:    Cognition: Cognition Overall Cognitive Status: Within Functional Limits for tasks assessed Cognition Arousal/Alertness: Awake/alert Behavior During  Therapy: WFL for tasks assessed/performed Overall Cognitive Status: Within Functional Limits for tasks assessed    Blood pressure 108/69, pulse 82, temperature 98 F (36.7 C), temperature source Oral, resp. rate 18, height '4\' 9"'  (1.448 m), weight 175.451 kg (386 lb 12.8 oz), last menstrual period 09/03/2015, SpO2 93 %. Physical Exam  Nursing note and vitals reviewed. Constitutional: She is oriented to person, place, and time. She appears well-developed and well-nourished.  HENT:  Head: Normocephalic and atraumatic.  Mouth/Throat: Oropharynx is clear and moist.  Eyes: Conjunctivae and EOM are normal. Pupils are equal, round, and reactive to light. Right eye exhibits no discharge. Left eye exhibits discharge.  Neck: Normal range of motion. Neck supple.  Cardiovascular: Normal rate and regular rhythm.   No murmur heard. Respiratory: Effort normal. No stridor. No respiratory distress. She has decreased breath sounds in the right lower field and the left lower field. She has no wheezes.  GI: Soft. Bowel sounds are normal. She exhibits no distension. There is no tenderness.  Musculoskeletal: She exhibits edema and tenderness.  Pedal edema bilaterally.   Kept RLE extended out and had  severe pain with minimal movements.   Neurological: She is alert and oriented to person, place, and time.  Sensation intact to light touch Motor: B/l UE: 5/5 proximal to distal RLE: Hip flexion 2+/5, knee extension 3-/5, ankle dorsi/plantar flexion 4+/5 LLE: Hip flexion, knee extension 4+/5, ankle doris/plantar flexion 5/5   Skin: Skin is warm and dry. No rash noted. No erythema.  Psychiatric: She has a normal mood and affect. Her behavior is normal. Judgment and thought content normal.    Results for orders placed or performed during the hospital encounter of 09/22/15 (from the past 48 hour(s))  Basic metabolic panel     Status: Abnormal   Collection Time: 09/26/15  3:17 AM  Result Value Ref Range   Sodium  139 135 - 145 mmol/L   Potassium 3.8 3.5 - 5.1 mmol/L   Chloride 95 (L) 101 - 111 mmol/L   CO2 34 (H) 22 - 32 mmol/L   Glucose, Bld 88 65 - 99 mg/dL   BUN 8 6 - 20 mg/dL   Creatinine, Ser 0.59 0.44 - 1.00 mg/dL   Calcium 7.8 (L) 8.9 - 10.3 mg/dL   GFR calc non Af Amer >60 >60 mL/min   GFR calc Af Amer >60 >60 mL/min    Comment: (NOTE) The eGFR has been calculated using the CKD EPI equation. This calculation has not been validated in all clinical situations. eGFR's persistently <60 mL/min signify possible Chronic Kidney Disease.    Anion gap 10 5 - 15  Basic metabolic panel     Status: Abnormal   Collection Time: 09/27/15  5:50 AM  Result Value Ref Range   Sodium 138 135 - 145 mmol/L   Potassium 3.8 3.5 - 5.1 mmol/L   Chloride 97 (L) 101 - 111 mmol/L   CO2 36 (H) 22 - 32 mmol/L   Glucose, Bld 101 (H) 65 - 99 mg/dL   BUN 10 6 - 20 mg/dL   Creatinine, Ser 0.62 0.44 - 1.00 mg/dL   Calcium 8.3 (L) 8.9 - 10.3 mg/dL   GFR calc non Af Amer >60 >60 mL/min   GFR calc Af Amer >60 >60 mL/min    Comment: (NOTE) The eGFR has been calculated using the CKD EPI equation. This calculation has not been validated in all clinical situations. eGFR's persistently <60 mL/min signify possible Chronic Kidney Disease.    Anion gap 5 5 - 15   No results found.     Medical Problem List and Plan: 1.  RLE weakness, abnormality of gait secondary to deconditioning.   2.  DVT Prophylaxis/Anticoagulation: Pharmaceutical: Lovenox 3. Pain Management: Mobic daily. Will add sportscreme as well as ice for local measures.  4. Mood: LCSW to follow for evaluation and support.  5. Neuropsych: This patient is capable of making decisions on her own behalf. 6. Skin/Wound Care: routine pressure relief measures.  7. Fluids/Electrolytes/Nutrition: Will add protein supplements between meals. Check lytes tomorrow and intermittently. Will continue Mobic for now with monitoring for recurrent fluid retention as well as  renal status.  8. Acute on chronic CHF with acute hypoxic respiratory failure: Monitor weights daily. Low salt diet. Continue Lasix for now. 9. Non-obstructive CAD: Low dose ASA daily. 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 Educate patient on importance of compliance after discharge. 11. Vitamin D deficiency: On supplement bid with follow up recommended on 08/01. 12. OHS/OSA: BIPAP at nights and whenever asleep.   13. Hypotension: Due to  diuresis. Monitor for symptoms as well as renal status for AKI.  14. Severe Obesity: Heart healthy diet-- add low salt. Will have dietician follow up closer to discharge to see if patient willing to modify diet after discharge.     Post Admission Physician Evaluation: 1. Functional deficits secondary  to deconditioning. 2. Patient is admitted to receive collaborative, interdisciplinary care between the physiatrist, rehab nursing staff, and therapy team. 3. Patient's level of medical complexity and substantial therapy needs in context of that medical necessity cannot be provided at a lesser intensity of care such as a SNF. 4. Patient has experienced substantial functional loss from his/her baseline which was documented above under the "Functional History" and "Functional Status" headings.  Judging by the patient's diagnosis, physical exam, and functional history, the patient has potential for functional progress which will result in measurable gains while on inpatient rehab.  These gains will be of substantial and practical use upon discharge  in facilitating mobility and self-care at the household level. 5. Physiatrist will provide 24 hour management of medical needs as well as oversight of the therapy plan/treatment and provide guidance as appropriate regarding the interaction of the two. 6. 24 hour rehab nursing will assist with disease management, pain management and patient education, does the patient  requir  and help integrate therapy concepts, techniques,education, etc. 7. PT will assess and treat for/with: Lower extremity strength, range of motion, stamina, balance, functional mobility, safety, adaptive techniques and equipment, coping skills, pain control, education.   Goals are: Min A/Supervision . 8. OT will assess and treat for/with: ADL's, functional mobility, safety, upper extremity strength, adaptive techniques and equipment, ego support, and community reintegration.   Goals are: Min A/Supervision. Therapy may proceed with showering this patient. 9. Case Management and Social Worker will assess and treat for psychological issues and discharge planning. 10. Team conference will be held weekly to assess progress toward goals and to determine barriers to discharge. 11. Patient will receive at least 3 hours of therapy per day at least 5 days per week. 12. ELOS: 13-17 days.       13. Prognosis:  good  Delice Lesch, MD  09/27/2015

## 2015-09-27 NOTE — Progress Notes (Signed)
Weldon Picking Rehab Admission Coordinator Signed Physical Medicine and Rehabilitation PMR Pre-admission 09/27/2015 3:43 PM  Related encounter: ED to Hosp-Admission (Discharged) from 09/22/2015 in [facility] 3E CHF    Expand All Collapse All   PMR Admission Coordinator Pre-Admission Assessment  Patient: Dana Abbott is an 50 y.o., female MRN: [id number] DOB: 07-11-1965 Height: 4\' 9"  (144.8 cm) Weight: (!) 175.451 kg (386 lb 12.8 oz)  Insurance Information HMO: PPO: PCP: IPA: 80/20: OTHER:  PRIMARY: Uninsured (Pt. acknowledges that she is uninsured and wishes to proceed with IP Rehab admission. Financial counselor Angelique Blonder has initiated case with pt.) Policy#: Subscriber:  CM Name: Phone#: Fax#:  Pre-Cert#: Employer:  Benefits: Phone #: Name:  Eff. Date: Deduct: Out of Pocket Max: Life Max:  CIR: SNF:  Outpatient: Co-Pay:  Home Health: Co-Pay:  DME: Co-Pay:  Providers:  SECONDARY: Policy#: Subscriber:  CM Name: Phone#: Fax#:  Pre-Cert#: Employer:  Benefits: Phone #: Name:  Eff. Date: Deduct: Out of Pocket Max: Life Max:  CIR: SNF:  Outpatient: Co-Pay:  Home Health: Co-Pay:  DME: Co-Pay:   Medicaid Application Date: Case Manager:  Disability Application Date: Case Worker:   Emergency Contact Information Contact Information    Name Relation Home Work Mobile   [place] Mother   [phone]     Current Medical History  Patient Admitting Diagnosis: Debility History of Present Illness:  Dana Abbott is a 50 y.o. female with history of severe obesity s/p gastric bypass, exertional dyspnea, CHF,  non-obstructive CAD s/p MI 2003, recent admission 07/2015 for CP, SOB due to ABLA and acute exacerbation of CHF. She was readmitted on 09/22/15 with progressive SOB, CP and BLE edema. BLE negative for DVT and Chest CT negative for PE. Blood cultures and UCS negative for infection. She was treated with IV diuresis for management of acute respiratory failure due to CHF exacerbation and in setting of OHS. She was placed on B12 injections due to recent diagnosis of pernicious anemia. 2 D echo showed EF 55% with no wall abnormality, mild LVH, moderately dilated RA and elevated pulmonary pressures. Has declined nutritional education regarding weight loss and has been educated on importance on vitamin supplementation. She continues to be hypoxic requiring supplemental oxygen and noted to be severely debilitated. Therapy ongoing and CIR recommended by MD and rehab team.   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 Has severe OA right knee with limited standing/activity tolerance at baseline--can stand for 10 minutes at a time. Has not sat up in chair yet but has been sitting at EOB for 30 minutes independently a few times a day this weekend. Lives with daughter who helps with housework, but works during the day.Pt's son states he will assist his mom as much as needed.  Pt. Was admitted to IP Rehab on 09/27/15      Past Medical History  Past Medical History  Diagnosis Date  . CHF (congestive heart failure) (HCC)   . Anemia   . DVT (deep venous thrombosis) (HCC) "after my heart attack"    "one of my legs"  . Heavy menses   . Myocardial infarction [facility]) 2003?  Marland Kitchen History of blood transfusion "I've had alot"    "I'm anemic"  . Arthritis     "knees" (07/31/2015)    Family History  family history includes COPD in her father; Diabetes type II in her daughter; Hypertension in her mother; Stroke in her mother. There is no history of  Cancer.  Prior Rehab/Hospitalizations:  Has the patient had major surgery during 100 days prior to admission? No  Current Medications   Current facility-administered medications:  . acetaminophen (TYLENOL) tablet 650 mg, 650 mg, Oral, Q6H PRN, Fuller Plan, MD, 650 mg at 09/26/15 1559 . aspirin EC tablet 81 mg, 81 mg, Oral, Daily, Denton Brick, MD, 81 mg at 09/27/15 1126 . cholecalciferol (VITAMIN D) tablet 5,000 Units, 5,000 Units, Oral, BID, Fuller Plan, MD, 5,000 Units at 09/27/15 1152 . cyanocobalamin ((VITAMIN B-12)) injection 1,000 mcg, 1,000 mcg, Intramuscular, Daily, Denton Brick, MD, 1,000 mcg at 09/27/15 1127 . docusate sodium (COLACE) capsule 100 mg, 100 mg, Oral, BID, Denton Brick, MD, 100 mg at 09/26/15 1049 . enoxaparin (LOVENOX) injection 80 mg, 80 mg, Subcutaneous, Q24H, Belinda Fisher Stone, RPH, 80 mg at 09/27/15 1119 . ferrous sulfate tablet 325 mg, 325 mg, Oral, TID WC, Denton Brick, MD, 325 mg at 09/27/15 1126 . furosemide (LASIX) tablet 40 mg, 40 mg, Oral, Daily, Denton Brick, MD, 40 mg at 09/27/15 1120 . meloxicam (MOBIC) tablet 15 mg, 15 mg, Oral, Daily, Fuller Plan, MD, 15 mg at 09/27/15 1127 . multivitamin (PROSIGHT) tablet 1 tablet, 1 tablet, Oral, Daily, Denton Brick, MD, 1 tablet at 09/27/15 1126  Patients Current Diet: Diet Heart Room service appropriate?: Yes; Fluid consistency:: Thin  Precautions / Restrictions Precautions Precautions: Fall Restrictions Weight Bearing Restrictions: No   Has the patient had 2 or more falls or a fall with injury in the past year?No  Prior Activity Level Community (5-7x/wk): Up until a month ago, pt. states she was working full time. She states her place of employment was not handicap accessible and they llet her go a month ago. 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  Home Assistive Devices / Equipment Home Assistive Devices/Equipment: Gilmer Mor (specify quad or  straight) Home Equipment: Grab bars - toilet, Grab bars - tub/shower, Walker - 4 wheels, Cane - single point  Prior Device Use: Indicate devices/aids used by the patient prior to current illness, exacerbation or injury? Walker  Prior Functional Level Prior Function Level of Independence: Independent with assistive device(s) Comments: Uses RW to ambulate household distances, uses RW due to arthritis pain in Rt knee. Uses scooter in grocery store. For tub/shower transfer she sits on side of tub, turns and then stands in tub/shower. Had two falls in the past 6 months from losing her footing.  Self Care: Did the patient need help bathing, dressing, using the toilet or eating? Independent  Indoor Mobility: Did the patient need assistance with walking from room to room (with or without device)? Independent  Stairs: Did the patient need assistance with internal or external stairs (with or without device)? Independent  Functional Cognition: Did the patient need help planning regular tasks such as shopping or remembering to take medications? Independent  Current Functional Level Cognition  Overall Cognitive Status: Within Functional Limits for tasks assessed   Extremity Assessment (includes Sensation/Coordination)  Upper Extremity Assessment: Overall WFL for tasks assessed  Lower Extremity Assessment: RLE deficits/detail, LLE deficits/detail RLE Deficits / Details: limited assessment due to Rt knee pain. Swelling Bil LEs. Functionally, pt requires assist to mobilize Rt LE w/ bed mobility. RLE: Unable to fully assess due to pain LLE Deficits / Details: Not formally assessed. Functionally, pt able to move Lt LE at mod I for supine>sit but requires assist against gravity w/ return to supine.    ADLs  Overall ADL's :  Needs assistance/impaired Eating/Feeding: Independent, Sitting Grooming: Set up, Sitting Upper Body Bathing: Set up, Sitting Lower Body Bathing: Maximal  assistance, Bed level Upper Body Dressing : Set up, Sitting Lower Body Dressing: Total assistance, Bed level Toilet Transfer Details (indicate cue type and reason): did not attempt due to standing needs to be attempted with +2 A for safety of pt and therapist General ADL Comments: educated pt on how to use sheet to help move her RLE in bed and in/out of bed    Mobility  Overal bed mobility: Needs Assistance Bed Mobility: Supine to Sit, Sit to Supine Supine to sit: Min guard, HOB elevated (use of rail and sheet to help move RLE) Sit to supine: Min assist (LLE, used sheet for RLE to get it back up into bed) General bed mobility comments: assist for R LE    Transfers  Overall transfer level: Needs assistance Equipment used: 4-wheeled walker (bariatric) Transfers: Sit to/from Stand Sit to Stand: +2 physical assistance, Min assist General transfer comment: assist for safety, pt uses momentum and pulls up on rollator, limited tolerance to standing and attempted x 3, stood x 2, third attempt too painful    Ambulation / Gait / Stairs / Engineer, drilling / Balance Dynamic Sitting Balance Sitting balance - Comments: Bil UE support to maintain balance sitting EOB Balance Overall balance assessment: Needs assistance Sitting-balance support: No upper extremity supported, Feet supported Sitting balance-Leahy Scale: Good Sitting balance - Comments: Bil UE support to maintain balance sitting EOB Standing balance support: Bilateral upper extremity supported Standing balance-Leahy Scale: Poor Standing balance comment: UE support and min A for balance, unable to straighten fully in standing due to knee pain, stood maybe 10 sec max over two trials    Special needs/care consideration BiPAP/CPAP Using Bipap nocturnally since admission  CPM no Continuous Drip IV no Dialysis no  Life Vest no Oxygen  Special Bed no Trach Size no Wound Vac  (area) no Location  Skin Per nursing assessment, pt. Has dry skin  Bowel mgmt: Last BM 09/22/15 per nursing assessment, continent Bladder mgmt: indwelling catheter Diabetic mgmt no     Previous Home Environment Living Arrangements: Children, Other relatives (daughter and grandson) Available Help at Discharge: Family, Available 24 hours/day (son confimrs availability 24/7) Type of Home: Apartment Home Layout: One level Home Access: Level entry Bathroom Shower/Tub: Tub/shower unit, Curtain, Merchandiser, retail: (pt. unsure) Home Care Services: No  Discharge Living Setting Plans for Discharge Living Setting: Patient's home Type of Home at Discharge: Apartment Discharge Home Layout: One level Discharge Home Access: Level entry Discharge Bathroom Shower/Tub: Tub/shower unit Discharge Bathroom Toilet: Standard Discharge Bathroom Accessibility: (pt. unsure) Does the patient have any problems obtaining your medications?: No  Social/Family/Support Systems Patient Roles: Parent Anticipated Caregiver: pt's son Aalyah Mansouri (pt. reports he does not answer the phone if her does not recognize the number). Pt. asks that we call pt's mom and she will convey message to [place]. Mom is [place] Anticipated Caregiver's Contact Information: Raelynn Corron, [phone] Ability/Limitations of Caregiver: son has just moved from [facility] and is not employed. He is currently living with pt's mom, Boluwatife Mutchler but will stay with pt. 24/7 at time of DC Caregiver Availability: 24/7 Discharge Plan Discussed with Primary Caregiver: Yes Is Caregiver In Agreement with Plan?: Yes Does Caregiver/Family have Issues with Lodging/Transportation while Pt is in Rehab?: No   Goals/Additional Needs Patient/Family Goal for Rehab: supervision and  min assist PT/OT, n/a SLP Expected length of stay: 13-17 days Cultural  Considerations: none Dietary Needs: heart healthy, thin liquids Equipment Needs: TBA Pt/Family Agrees to Admission and willing to participate: Yes Program Orientation Provided & Reviewed with Pt/Caregiver Including Roles & Responsibilities: Yes   Decrease burden of Care through IP rehab admission: n/a   Possible need for SNF placement upon discharge: Not anticipated   Patient Condition: This patient's condition remains as documented in the consult dated 09/27/15 , in which the Rehabilitation Physician determined and documented that the patient's condition is appropriate for intensive rehabilitative care in an inpatient rehabilitation facility. Will admit to inpatient rehab today.  Preadmission Screen Completed By: Weldon Picking, 09/27/2015 3:53 PM ______________________________________________________________________  Discussed status with Dr. Allena Katz on 09/27/15 at 1553 and received telephone approval for admission today.  Admission Coordinator: Weldon Picking, time 1478 /Date 09/27/15          Cosigned by: Ankit Karis Juba, MD at 09/27/2015 3:58 PM  Revision History     Date/Time User Provider Type Action   09/27/2015 3:58 PM Ankit Karis Juba, MD Physician Cosign   09/27/2015 3:54 PM Weldon Picking Rehab Admission Coordinator Sign

## 2015-09-27 NOTE — H&P (Signed)
Physical Medicine and Rehabilitation Admission H&P    Chief Complaint  Patient presents with  . debility    HPI:  Dana Abbott is a 50 y.o. female with history of severe obesity s/p gastric bypass, exertional dyspnea, CHF, non-obstructive CAD s/p MI 2003, recent admission 07/2015 for CP, SOB due to ABLA and acute exacerbation of CHF. She was readmitted on 09/22/15 with progressive SOB, CP and BLE edema. BLE negative for DVT and Chest CT negative for PE. Blood cultures and UCS negative for infection. She was treated with IV diuresis for management of acute respiratory failure due to CHF exacerbation and in setting of OHS. She was placed on B12 injections due to recent diagnosis of pernicious anemia. 2 D echo showed EF 55% with no wall abnormality, mild LVH, moderately dilated RA and elevated pulmonary pressures. Has declined nutritional education regarding weight loss and has been educated on importance on vitamin supplementation. She continues to be hypoxic requiring supplemental oxygen and noted to be severely debilitated. Therapy ongoing and CIR recommended by MD and rehab team.      Review of Systems  HENT: Negative for hearing loss.   Eyes: Negative for blurred vision and double vision.  Respiratory: Positive for shortness of breath. Negative for cough and sputum production.   Cardiovascular: Positive for leg swelling. Negative for chest pain and palpitations.  Gastrointestinal: Negative for heartburn, nausea, abdominal pain and constipation.  Genitourinary: Negative for dysuria.  Musculoskeletal: Positive for myalgias and joint pain.  Skin: Negative for itching.  Neurological: Positive for weakness. Negative for dizziness, tingling, sensory change, focal weakness and headaches.  Psychiatric/Behavioral: The patient has insomnia.   All other systems reviewed and are negative.     Past Medical History  Diagnosis Date  . CHF (congestive heart failure) ([place])   . Anemia     . DVT (deep venous thrombosis) ([place]) "after my heart attack"    "one of my legs"  . Heavy menses   . Myocardial infarction [facility]) 2003?  Marland Kitchen History of blood transfusion "I've had alot"    "I'm anemic"  . Arthritis     "knees" (07/31/2015)    Past Surgical History  Procedure Laterality Date  . Cesarean section  1985; 1991; 1993  . Gastric bypass  2004  . Tonsillectomy    . Carpal tunnel release Right   . Cholecystectomy  2004    w/gastric OR    Family History  Problem Relation Age of Onset  . Stroke Mother   . Hypertension Mother   . COPD Father   . Cancer Neg Hx   . Diabetes type II Daughter     Social History:  Lives with family.  Was working in collections till a month ago--could stand for about 10 minutes at a time due to pain. 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 Has walker for the past month. She reports that she has never smoked. She has never used smokeless tobacco. She reports that she does not drink alcohol or use illicit drugs.     Allergies  Allergen Reactions  . Shellfish Allergy     Rash     Medications Prior to Admission  Medication Sig Dispense Refill  . Acetaminophen (TYLENOL 8 HOUR ARTHRITIS PAIN PO) Take 1,300 mg by mouth every 8 (eight) hours as needed (arthritis pain).    Marland Kitchen aspirin 81 MG chewable tablet Chew 162 mg by mouth once.    . docusate sodium (COLACE) 100 MG  capsule Take 1 capsule (100 mg total) by mouth 2 (two) times daily. (Patient not taking: Reported on 08/28/2015) 10 capsule 0  . ferrous sulfate 325 (65 FE) MG tablet Take 1 tablet (325 mg total) by mouth 3 (three) times daily with meals. (Patient not taking: Reported on 09/22/2015) 90 tablet 0  . furosemide (LASIX) 20 MG tablet Take 1 tablet (20 mg total) by mouth daily. (Patient not taking: Reported on 09/22/2015) 5 tablet 0  . hydrochlorothiazide (HYDRODIURIL) 25 MG tablet Take 1 tablet (25 mg total) by mouth daily. 30 tablet 0  . HYDROcodone-acetaminophen (NORCO/VICODIN)  5-325 MG tablet Take 1-2 tablets by mouth every 6 (six) hours as needed. (Patient not taking: Reported on 09/22/2015) 10 tablet 0  . Potassium Chloride ER 20 MEQ TBCR Take 20 mEq by mouth daily. (Patient not taking: Reported on 09/22/2015) 5 tablet 0    Home: Home Living Family/patient expects to be discharged to:: Private residence Living Arrangements: Children, Other relatives (daughter and grandson) Available Help at Discharge: Family, Available PRN/intermittently (daughter goes to work during the days) Type of Home: Apartment Home Access: Level entry Home Layout: One level Bathroom Shower/Tub: Public librarian, Curtain, Charity fundraiser: Standard Home Equipment: Grab bars - toilet, Grab bars - tub/shower, Environmental consultant - 4 wheels, Cane - single point   Functional History: Prior Function Level of Independence: Independent with assistive device(s) Comments: Uses RW to ambulate household distances, uses RW due to arthritis pain in Rt knee. Uses scooter in grocery store.  For tub/shower transfer she sits on side of tub, turns and then stands in tub/shower.  Had two falls in the past 6 months from losing her footing.  Functional Status:  Mobility: Bed Mobility Overal bed mobility: Needs Assistance Bed Mobility: Supine to Sit Supine to sit: Min assist Sit to supine: Max assist, +2 for safety/equipment, +2 for physical assistance General bed mobility comments: assist for R LE Transfers Overall transfer level: Needs assistance Equipment used: 4-wheeled walker (bariatric) Transfers: Sit to/from Stand Sit to Stand: +2 physical assistance, Min assist General transfer comment: assist for safety, pt uses momentum and pulls up on rollator, limited tolerance to standing and attempted x 3, stood x 2, third attempt too painful      ADL:    Cognition: Cognition Overall Cognitive Status: Within Functional Limits for tasks assessed Cognition Arousal/Alertness: Awake/alert Behavior During  Therapy: WFL for tasks assessed/performed Overall Cognitive Status: Within Functional Limits for tasks assessed    Blood pressure 108/69, pulse 82, temperature 98 F (36.7 C), temperature source Oral, resp. rate 18, height '4\' 9"'  (1.448 m), weight 175.451 kg (386 lb 12.8 oz), last menstrual period 09/03/2015, SpO2 93 %. Physical Exam  Nursing note and vitals reviewed. Constitutional: She is oriented to person, place, and time. She appears well-developed and well-nourished.  HENT:  Head: Normocephalic and atraumatic.  Mouth/Throat: Oropharynx is clear and moist.  Eyes: Conjunctivae and EOM are normal. Pupils are equal, round, and reactive to light. Right eye exhibits no discharge. Left eye exhibits discharge.  Neck: Normal range of motion. Neck supple.  Cardiovascular: Normal rate and regular rhythm.   No murmur heard. Respiratory: Effort normal. No stridor. No respiratory distress. She has decreased breath sounds in the right lower field and the left lower field. She has no wheezes.  GI: Soft. Bowel sounds are normal. She exhibits no distension. There is no tenderness.  Musculoskeletal: She exhibits edema and tenderness.  Pedal edema bilaterally.   Kept RLE extended out and had  severe pain with minimal movements.   Neurological: She is alert and oriented to person, place, and time.  Sensation intact to light touch Motor: B/l UE: 5/5 proximal to distal RLE: Hip flexion 2+/5, knee extension 3-/5, ankle dorsi/plantar flexion 4+/5 LLE: Hip flexion, knee extension 4+/5, ankle doris/plantar flexion 5/5   Skin: Skin is warm and dry. No rash noted. No erythema.  Psychiatric: She has a normal mood and affect. Her behavior is normal. Judgment and thought content normal.    Results for orders placed or performed during the hospital encounter of 09/22/15 (from the past 48 hour(s))  Basic metabolic panel     Status: Abnormal   Collection Time: 09/26/15  3:17 AM  Result Value Ref Range   Sodium  139 135 - 145 mmol/L   Potassium 3.8 3.5 - 5.1 mmol/L   Chloride 95 (L) 101 - 111 mmol/L   CO2 34 (H) 22 - 32 mmol/L   Glucose, Bld 88 65 - 99 mg/dL   BUN 8 6 - 20 mg/dL   Creatinine, Ser 0.59 0.44 - 1.00 mg/dL   Calcium 7.8 (L) 8.9 - 10.3 mg/dL   GFR calc non Af Amer >60 >60 mL/min   GFR calc Af Amer >60 >60 mL/min    Comment: (NOTE) The eGFR has been calculated using the CKD EPI equation. This calculation has not been validated in all clinical situations. eGFR's persistently <60 mL/min signify possible Chronic Kidney Disease.    Anion gap 10 5 - 15  Basic metabolic panel     Status: Abnormal   Collection Time: 09/27/15  5:50 AM  Result Value Ref Range   Sodium 138 135 - 145 mmol/L   Potassium 3.8 3.5 - 5.1 mmol/L   Chloride 97 (L) 101 - 111 mmol/L   CO2 36 (H) 22 - 32 mmol/L   Glucose, Bld 101 (H) 65 - 99 mg/dL   BUN 10 6 - 20 mg/dL   Creatinine, Ser 0.62 0.44 - 1.00 mg/dL   Calcium 8.3 (L) 8.9 - 10.3 mg/dL   GFR calc non Af Amer >60 >60 mL/min   GFR calc Af Amer >60 >60 mL/min    Comment: (NOTE) The eGFR has been calculated using the CKD EPI equation. This calculation has not been validated in all clinical situations. eGFR's persistently <60 mL/min signify possible Chronic Kidney Disease.    Anion gap 5 5 - 15   No results found.     Medical Problem List and Plan: 1.  RLE weakness, abnormality of gait secondary to deconditioning.   2.  DVT Prophylaxis/Anticoagulation: Pharmaceutical: Lovenox 3. Pain Management: Mobic daily. Will add sportscreme as well as ice for local measures.  4. Mood: LCSW to follow for evaluation and support.  5. Neuropsych: This patient is capable of making decisions on her own behalf. 6. Skin/Wound Care: routine pressure relief measures.  7. Fluids/Electrolytes/Nutrition: Will add protein supplements between meals. Check lytes tomorrow and intermittently. Will continue Mobic for now with monitoring for recurrent fluid retention as well as  renal status.  8. Acute on chronic CHF with acute hypoxic respiratory failure: Monitor weights daily. Low salt diet. Continue Lasix for now. 9. Non-obstructive CAD: Low dose ASA daily. 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 Educate patient on importance of compliance after discharge. 11. Vitamin D deficiency: On supplement bid with follow up recommended on 08/01. 12. OHS/OSA: BIPAP at nights and whenever asleep.   13. Hypotension: Due to  diuresis. Monitor for symptoms as well as renal status for AKI.  14. Severe Obesity: Heart healthy diet-- add low salt. Will have dietician follow up closer to discharge to see if patient willing to modify diet after discharge.     Post Admission Physician Evaluation: 1. Functional deficits secondary  to deconditioning. 2. Patient is admitted to receive collaborative, interdisciplinary care between the physiatrist, rehab nursing staff, and therapy team. 3. Patient's level of medical complexity and substantial therapy needs in context of that medical necessity cannot be provided at a lesser intensity of care such as a SNF. 4. Patient has experienced substantial functional loss from his/her baseline which was documented above under the "Functional History" and "Functional Status" headings.  Judging by the patient's diagnosis, physical exam, and functional history, the patient has potential for functional progress which will result in measurable gains while on inpatient rehab.  These gains will be of substantial and practical use upon discharge  in facilitating mobility and self-care at the household level. 5. Physiatrist will provide 24 hour management of medical needs as well as oversight of the therapy plan/treatment and provide guidance as appropriate regarding the interaction of the two. 6. 24 hour rehab nursing will assist with disease management, pain management and patient education, does the patient  requir  and help integrate therapy concepts, techniques,education, etc. 7. PT will assess and treat for/with: Lower extremity strength, range of motion, stamina, balance, functional mobility, safety, adaptive techniques and equipment, coping skills, pain control, education.   Goals are: Min A/Supervision . 8. OT will assess and treat for/with: ADL's, functional mobility, safety, upper extremity strength, adaptive techniques and equipment, ego support, and community reintegration.   Goals are: Min A/Supervision. Therapy may proceed with showering this patient. 9. Case Management and Social Worker will assess and treat for psychological issues and discharge planning. 10. Team conference will be held weekly to assess progress toward goals and to determine barriers to discharge. 11. Patient will receive at least 3 hours of therapy per day at least 5 days per week. 12. ELOS: 13-17 days.       13. Prognosis:  good  Delice Lesch, MD  09/27/2015

## 2015-09-27 NOTE — Interval H&P Note (Signed)
Dana Abbott was admitted today to Inpatient Rehabilitation with the diagnosis of deconditioning.  The patient's history has been reviewed, patient examined, and there is no change in status.  Patient continues to be appropriate for intensive inpatient rehabilitation.  I have reviewed the patient's chart and labs.  Questions were answered to the patient's satisfaction. The PAPE has been reviewed and assessment remains appropriate.  Ankit Karis Juba 09/27/2015, 7:49 PM

## 2015-09-27 NOTE — Progress Notes (Deleted)
  1. Does the need for close, 24 hr/day medical supervision in concert with the patient's rehab needs make it unreasonable for this patient to be served in a less intensive setting? Potentially 2. Co-Morbidities requiring supervision/potential complications: severe obesity s/p gastric bypass Body mass index is 83.68 kg/(m^2)., diet and exercise education, encourage weight loss to increase endurance and promote overall health), exertional dyspnea (monitor RR and O2 says with increased activity), CHF (Monitor in accordance with increased physical activity and avoid UE resistance excercises), non-obstructive CAD s/p MI 2003 (cont meds), ABLA (transfuse if necessary to ensure appropriate perfusion for increased activity tolerance), pernicious anemia (see above), supplemental oxygen (cont as needed), severe OA right knee (ensure pain does not limit therapies), hypotension (monitor BP with increased activity) 3. Due to disease management, pain management and patient education, does the patient require 24 hr/day rehab nursing? Potentially 4. Does the patient require coordinated care of a physician, rehab nurse, PT (1-2 hrs/day, 5 days/week) and OT (1-2 hrs/day, 5 days/week) to address physical and functional deficits in the context of the above medical diagnosis(es)? Potentially Addressing deficits in the following areas: balance, endurance, locomotion, strength, transferring, dressing, toileting and psychosocial support 5. Can the patient actively participate in an intensive therapy program of at least 3 hrs of therapy per day at least 5 days per week? Potentially 6. The potential for patient to make measurable gains while on inpatient rehab is excellent 7. Anticipated functional outcomes upon discharge from inpatient rehab are supervision and min assist with PT, supervision and min assist with OT, n/a with SLP. 8. Estimated rehab length of stay to reach the above functional goals is: 13-17 days. 9. Does the  patient have adequate social supports and living environment to accommodate these discharge functional goals? Potentially 10. Anticipated D/C setting: TBD 11. Anticipated post D/C treatments: HH therapy and Home excercise program 12. Overall Rehab/Functional Prognosis: good  RECOMMENDATIONS: This patient's condition is appropriate for continued rehabilitative care in the following setting: Will inquire about availability of caregiver support at discharge. Possibly CIR.  Patient has agreed to participate in recommended program. Potentially Note that insurance prior authorization may be required for reimbursement for recommended care.  Comment: Rehab Admissions Coordinator to follow up.  Maryla Morrow, MD 09/27/2015       Revision History

## 2015-09-27 NOTE — Progress Notes (Signed)
Subjective: Dana Abbott is a 50 y/o woman with severe obesity, pernicious anemia, iron deficiency anemia, CAD (s/p MI 2003), history of DVT, and CHF (EF 55%) who presents for acute hypoxic respiratory failure. There were no events overnight. She denies chest pain, leg pain, and shortness of breath this morning. She has adequate urine output on furosemide PO. She is eager to work with PT to regain her baseline ambulatory function.  Objective: Vital signs in last 24 hours: Filed Vitals:   09/26/15 1232 09/26/15 2038 09/26/15 2104 09/27/15 0612  BP: 101/76 95/53  99/58  Pulse: 85 96 80 75  Temp: 98 F (36.7 C) 98.2 F (36.8 C)  98.7 F (37.1 C)  TempSrc: Oral Oral  Oral  Resp: 20 18 18 20   Height:      Weight:    175.451 kg (386 lb 12.8 oz)  SpO2: 96% 96%     Weight change: +8 kg  Intake/Output Summary (Last 24 hours) at 09/27/15 0804 Last data filed at 09/27/15 0600  Gross per 24 hour  Intake    940 ml  Output   1175 ml  Net   -235 ml   Physical Exam:  General: In no apparent distress. Laying in bed. CV: RRR. Normal S1/S2. No m/r/g. Pulm: CTAB Abdomen: Soft, NT/ND. Positive bowel sounds. Ext: Trace edema in bilateral lower extremities.  Lab Results: Basic Metabolic Panel:  Recent Labs  02/77/41 0317 09/27/15 0550  NA 139 138  K 3.8 3.8  CL 95* 97*  CO2 34* 36*  GLUCOSE 88 101*  BUN 8 10  CREATININE 0.59 0.62  CALCIUM 7.8* 8.3*   CBC:  Recent Labs  09/25/15 0257  WBC 4.8  HGB 8.6*  HCT 32.4*  MCV 81.4  PLT 193    Micro Results: Recent Results (from the past 240 hour(s))  Blood culture (routine x 2)     Status: None (Preliminary result)   Collection Time: 09/22/15  1:15 PM  Result Value Ref Range Status   Specimen Description BLOOD RIGHT ANTECUBITAL  Final   Special Requests BOTTLES DRAWN AEROBIC ONLY 10CC  Final   Culture NO GROWTH 4 DAYS  Final   Report Status PENDING  Incomplete  Blood culture (routine x 2)     Status: None (Preliminary result)     Collection Time: 09/22/15  1:21 PM  Result Value Ref Range Status   Specimen Description BLOOD RIGHT HAND  Final   Special Requests IN PEDIATRIC BOTTLE 4CC  Final   Culture NO GROWTH 4 DAYS  Final   Report Status PENDING  Incomplete   Medications: I have reviewed the patient's current medications. Scheduled Meds: . aspirin EC  81 mg Oral Daily  . cholecalciferol  5,000 Units Oral BID  . cyanocobalamin  1,000 mcg Intramuscular Daily  . docusate sodium  100 mg Oral BID  . enoxaparin (LOVENOX) injection  80 mg Subcutaneous Q24H  . ferrous sulfate  325 mg Oral TID WC  . furosemide  40 mg Oral Daily  . meloxicam  15 mg Oral Daily  . multivitamin  1 tablet Oral Daily   Continuous Infusions:  PRN Meds:.acetaminophen Assessment/Plan: Principal Problem:   Hypoxia Active Problems:   Blood loss anemia   Pernicious anemia   CAD (coronary artery disease), native coronary artery   History of DVT (deep vein thrombosis)   Respiratory failure, acute (HCC)   CHF (congestive heart failure) ([facility])  [name] is a 50 y/o woman with severe obesity, pernicious and  iron deficient anemia, CAD (s/p MI 2003), CHF (EF 55%), and history of DVT who presents for acute hypoxic respiratory failure.  Acute hypoxic respiratory failure: CXR, venous duplex, and CT discount pneumothorax, DVT or PE, and aortic dissection. Negative troponin and unchanged EKG make tamponade and ACS unlikely. Echocardiogram showed preserved ejection fraction and right heart dysfunction. Left heart function normal. Her high BMI as well as elevated HCO2 and PaCO2 suggest obesity hypoventilation syndrome. She has had adequate urine output with furosemide after switching to PO. Increased weight over two days may be the result of different bed rather than true weight gain due to fluid retention. - Furosemide 40 mg PO - BiPAP qhs - d/c telemetry - Consult social work regarding emergency insurance for SNF placement vs. CIR - PT  recommendation 5x/week  Chest pain: No changes in EKG from 07/2015. She reports that pain in chest and arm have resolved. - PRN Tylenol  Pernicious anemia with iron deficiency anemia: Admitted 07/2015 for anemia and was given 2 U pRBCs. Had low B12 and iron levels with elevated intrinsic factor antibodies during this admission. - B12 injection 1000 mcg, daily - Ferrous sulfate 325 mg, daily  CHF: Right-sided HF (preserved EF 55%) seen on echocardiogram.  - Furosemide as above - Consider ACE inhibitor upon discharge  CAD: s/p MI 2003. Has not been taking medication because of lack of insurance. - ASA 81 mg, daily  Bilateral complex cyst: IR evaluated and said most likely edema in axillae. No further action required at this time.  DVT history: post MI (2003) - Lovenox injection 40 mg, daily   This is a Psychologist, occupational Note.  The care of the patient was discussed with Dr. Sheliah Hatch and the assessment and plan formulated with their assistance.  Please see their attached note for official documentation of the daily encounter.   LOS: 5 days   Dana Abbott, Med Student 09/27/2015, 8:04 AM

## 2015-09-28 ENCOUNTER — Inpatient Hospital Stay (HOSPITAL_COMMUNITY): Payer: PRIVATE HEALTH INSURANCE | Admitting: Physical Therapy

## 2015-09-28 ENCOUNTER — Inpatient Hospital Stay (HOSPITAL_COMMUNITY): Payer: PRIVATE HEALTH INSURANCE | Admitting: Occupational Therapy

## 2015-09-28 DIAGNOSIS — R5381 Other malaise: Secondary | ICD-10-CM | POA: Diagnosis not present

## 2015-09-28 DIAGNOSIS — D509 Iron deficiency anemia, unspecified: Secondary | ICD-10-CM | POA: Diagnosis not present

## 2015-09-28 DIAGNOSIS — R269 Unspecified abnormalities of gait and mobility: Secondary | ICD-10-CM | POA: Diagnosis not present

## 2015-09-28 DIAGNOSIS — E8770 Fluid overload, unspecified: Secondary | ICD-10-CM | POA: Diagnosis not present

## 2015-09-28 LAB — CBC WITH DIFFERENTIAL/PLATELET
BASOS PCT: 0 %
Basophils Absolute: 0 10*3/uL (ref 0.0–0.1)
EOS PCT: 6 %
Eosinophils Absolute: 0.3 10*3/uL (ref 0.0–0.7)
HEMATOCRIT: 37.2 % (ref 36.0–46.0)
Hemoglobin: 9.7 g/dL — ABNORMAL LOW (ref 12.0–15.0)
Lymphocytes Relative: 27 %
Lymphs Abs: 1.2 10*3/uL (ref 0.7–4.0)
MCH: 22 pg — ABNORMAL LOW (ref 26.0–34.0)
MCHC: 26.1 g/dL — ABNORMAL LOW (ref 30.0–36.0)
MCV: 84.4 fL (ref 78.0–100.0)
MONOS PCT: 7 %
Monocytes Absolute: 0.3 10*3/uL (ref 0.1–1.0)
NEUTROS PCT: 60 %
Neutro Abs: 2.5 10*3/uL (ref 1.7–7.7)
Platelets: 164 10*3/uL (ref 150–400)
RBC: 4.41 MIL/uL (ref 3.87–5.11)
RDW: 22.4 % — ABNORMAL HIGH (ref 11.5–15.5)
WBC: 4.3 10*3/uL (ref 4.0–10.5)

## 2015-09-28 LAB — COMPREHENSIVE METABOLIC PANEL
ALK PHOS: 63 U/L (ref 38–126)
ALT: 33 U/L (ref 14–54)
ANION GAP: 6 (ref 5–15)
AST: 31 U/L (ref 15–41)
Albumin: 2.8 g/dL — ABNORMAL LOW (ref 3.5–5.0)
BILIRUBIN TOTAL: 0.7 mg/dL (ref 0.3–1.2)
BUN: 11 mg/dL (ref 6–20)
CALCIUM: 8.9 mg/dL (ref 8.9–10.3)
CO2: 36 mmol/L — AB (ref 22–32)
CREATININE: 0.58 mg/dL (ref 0.44–1.00)
Chloride: 98 mmol/L — ABNORMAL LOW (ref 101–111)
Glucose, Bld: 105 mg/dL — ABNORMAL HIGH (ref 65–99)
Potassium: 4.3 mmol/L (ref 3.5–5.1)
Sodium: 140 mmol/L (ref 135–145)
TOTAL PROTEIN: 6.5 g/dL (ref 6.5–8.1)

## 2015-09-28 MED ORDER — ENOXAPARIN SODIUM 80 MG/0.8ML ~~LOC~~ SOLN
70.0000 mg | SUBCUTANEOUS | Status: DC
  Filled 2015-09-28: qty 0.8

## 2015-09-28 MED ORDER — TRAMADOL HCL 50 MG PO TABS
50.0000 mg | ORAL_TABLET | Freq: Four times a day (QID) | ORAL | Status: DC | PRN
  Administered 2015-09-29 – 2015-10-04 (×3): 50 mg via ORAL
  Filled 2015-09-28 (×3): qty 1

## 2015-09-28 MED ORDER — TROLAMINE SALICYLATE 10 % EX CREA
TOPICAL_CREAM | Freq: Three times a day (TID) | CUTANEOUS | Status: DC
  Filled 2015-09-28: qty 85

## 2015-09-28 MED ORDER — VITAMINS A & D EX OINT
TOPICAL_OINTMENT | Freq: Three times a day (TID) | CUTANEOUS | Status: DC
  Administered 2015-09-28 (×2): via TOPICAL
  Administered 2015-09-30: 1 via TOPICAL
  Administered 2015-10-01 – 2015-10-05 (×3): via TOPICAL
  Filled 2015-09-28: qty 56.7

## 2015-09-28 MED ORDER — MUSCLE RUB 10-15 % EX CREA
TOPICAL_CREAM | Freq: Three times a day (TID) | CUTANEOUS | Status: DC
  Administered 2015-09-28 – 2015-10-04 (×16): via TOPICAL
  Administered 2015-10-04: 2 via TOPICAL
  Administered 2015-10-05 (×4): via TOPICAL
  Filled 2015-09-28: qty 85

## 2015-09-28 NOTE — Plan of Care (Signed)
Problem: RH Balance Goal: LTG Patient will maintain dynamic standing balance (PT) LTG: Patient will maintain dynamic standing balance with assistance during mobility activities (PT) With LRAD  Problem: RH Bed Mobility Goal: LTG Patient will perform bed mobility with assist (PT) LTG: Patient will perform bed mobility with assistance, with/without cues (PT). Regular bed  Problem: RH Bed to Chair Transfers Goal: LTG Patient will perform bed/chair transfers w/assist (PT) LTG: Patient will perform bed/chair transfers with assistance, with/without cues (PT). With LRAD  Problem: RH Car Transfers Goal: LTG Patient will perform car transfers with assist (PT) LTG: Patient will perform car transfers with assistance (PT). With LRAD  Problem: RH Furniture Transfers Goal: LTG Patient will perform furniture transfers w/assist (OT/PT LTG: Patient will perform furniture transfers with assistance (OT/PT). With LRAD  Problem: RH Ambulation Goal: LTG Patient will ambulate in controlled environment (PT) LTG: Patient will ambulate in a controlled environment, # of feet with assistance (PT). 50 ft with LRAD Goal: LTG Patient will ambulate in home environment (PT) LTG: Patient will ambulate in home environment, # of feet with assistance (PT). 25 ft with LRAD  Problem: RH Stairs Goal: LTG Patient will ambulate up and down stairs w/assist (PT) LTG: Patient will ambulate up and down # of stairs with assistance (PT) 4 steps with B rails for BLE strengthening purposes

## 2015-09-28 NOTE — Progress Notes (Signed)
[place] PHYSICAL MEDICINE & REHABILITATION     PROGRESS NOTE  Subjective/Complaints:  Pt sitting up in bed.  She states she slept well overnight and may only been in rehab for a couple of days.   ROS: Denies CP, SOB, N/V/D  Objective: Vital Signs: Blood pressure 112/65, pulse 65, temperature 98.3 F (36.8 C), temperature source Oral, resp. rate 22, weight 148.7 kg (327 lb 13.2 oz), last menstrual period 09/03/2015, SpO2 97 %. No results found.  Recent Labs  09/28/15 0518  WBC 4.3  HGB 9.7*  HCT 37.2  PLT 164    Recent Labs  09/27/15 0550 09/28/15 0518  NA 138 140  K 3.8 4.3  CL 97* 98*  GLUCOSE 101* 105*  BUN 10 11  CREATININE 0.62 0.58  CALCIUM 8.3* 8.9   CBG (last 3)  No results for input(s): GLUCAP in the last 72 hours.  Wt Readings from Last 3 Encounters:  09/28/15 148.7 kg (327 lb 13.2 oz)  09/27/15 175.451 kg (386 lb 12.8 oz)  08/28/15 166.47 kg (367 lb)    Physical Exam:  BP 112/65 mmHg  Pulse 65  Temp(Src) 98.3 F (36.8 C) (Oral)  Resp 22  Wt 148.7 kg (327 lb 13.2 oz)  SpO2 97%  LMP 09/03/2015 (Approximate) Constitutional: She appears well-developed and well-nourished. NAD HENT: Normocephalic and atraumatic.  Eyes: Conjunctivae and EOM are normal.  Cardiovascular: Normal rate and regular rhythm. No murmur heard. Respiratory: Effort normal. No stridor. No respiratory distress. She has no wheezes.  GI: Soft. Bowel sounds are normal. She exhibits no distension. There is no tenderness.  Musculoskeletal: She exhibits edema and tenderness.  Pedal edema bilaterally.   Neurological: She is alert and oriented.  Sensation intact to light touch Motor: B/l UE: 5/5 proximal to distal RLE: Hip flexion 3-/5, knee extension 4-/5, ankle dorsi/plantar flexion 4+/5 LLE: Hip flexion, knee extension 4+/5, ankle doris/plantar flexion 5/5  Skin: Skin is warm and dry. No rash noted. No erythema.  Psychiatric: She has a normal mood and affect. Her behavior  is normal. Judgment and thought content normal.    Assessment/Plan: 1. Functional deficits secondary to deconditioning which require 3+ hours per day of interdisciplinary therapy in a comprehensive inpatient rehab setting. Physiatrist is providing close team supervision and 24 hour management of active medical problems listed below. Physiatrist and rehab team continue to assess barriers to discharge/monitor patient progress toward functional and medical goals.  Function:  Bathing Bathing position      Bathing parts      Bathing assist        Upper Body Dressing/Undressing Upper body dressing                    Upper body assist        Lower Body Dressing/Undressing Lower body dressing                                  Lower body assist        Toileting Toileting   Toileting steps completed by patient: Adjust clothing prior to toileting, Adjust clothing after toileting (wearing gown) Toileting steps completed by helper: Performs perineal hygiene    Toileting assist Assist level: Touching or steadying assistance (Pt.75%)   Transfers Chair/bed transfer             Lobbyist  Cognition Comprehension Comprehension assist level: Follows complex conversation/direction with no assist  Expression Expression assist level: Expresses complex ideas: With extra time/assistive device  Social Interaction Social Interaction assist level: Interacts appropriately with others - No medications needed.  Problem Solving Problem solving assist level: Solves complex problems: Recognizes & self-corrects  Memory Memory assist level: Complete Independence: No helper    Medical Problem List and Plan: 1. RLE weakness, abnormality of gait secondary to deconditioning.   Begin CIR 2. DVT Prophylaxis/Anticoagulation: Pharmaceutical: Lovenox 3. Pain Management: Mobic daily. Added sportscreme as well as ice for local  measures.  4. Mood: LCSW to follow for evaluation and support.  5. Neuropsych: This patient is capable of making decisions on her own behalf. 6. Skin/Wound Care: routine pressure relief measures.  7. Fluids/Electrolytes/Nutrition: Added protein supplements between meals. Check lytes intermittently. Will continue Mobic for now with monitoring for recurrent fluid retention as well as renal status.   BMP within acceptable rage on 6/6 8. Acute on chronic CHF with acute hypoxic respiratory failure: Monitor weights daily. Low salt diet. Continue Lasix for now. 9. Non-obstructive CAD: Low dose ASA daily. 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 Educate patient on importance of compliance after discharge. 11. Vitamin D deficiency: On supplement bid with follow up recommended on 08/01. 12. OHS/OSA: BIPAP at nights and whenever asleep.  13. Hypotension: Due to diuresis. Monitor for symptoms as well as renal status for AKI.  14. Severe Obesity: Heart healthy diet-- added low salt diet. Will have dietician follow up closer to discharge to see if patient willing to modify diet after discharge.  15. Anemia  Hb 9.7 on 6/6 (improving)  Will cont to monitor   LOS (Days) 1 A FACE TO FACE EVALUATION WAS PERFORMED  Sahir Tolson Karis Juba 09/28/2015 8:30 AM

## 2015-09-28 NOTE — Progress Notes (Signed)
Occupational Therapy Session Note  Patient Details  Name: Dana Abbott MRN: [id number] Date of Birth: [birth year]  Today's Date: 09/28/2015 OT Individual Time: 1130-1200 OT Individual Time Calculation (min): 30 min      Skilled Therapeutic Interventions/Progress Updates:    Upon entering the room, pt seated in wheelchair awaiting therapist with 2/10 c/o R knee pain. Pt verbalizing need for toileting once therapist entered the room. Pt performed sit >stand from wheelchair with steady assist and use of RW. Pt ambulated 4' with steady assist to bariatric [facility] for toileting. Pt preformed clothing management and hygiene after voiding with steady assist. Pt wearing skirt she was able to pull up and down for tasks. Pt standing and ambulating 6' to low recliner chair as pt is short. Foot stool placed for comfort in order to allow pt to scoot bottom back further in chair. Call bell and all needed items within reach upon exiting the room.   Therapy Documentation Precautions:  Precautions Precautions: Fall Precaution Comments: limited by R knee pain Restrictions Weight Bearing Restrictions: No General:   Vital Signs: Therapy Vitals Pulse Rate: 92 BP: 113/80 mmHg Patient Position (if appropriate): Sitting Oxygen Therapy SpO2: 93 % (at rest) O2 Device: Not Delivered Pain: Pain Assessment Pain Assessment: 0-10 (pt reports this is baseline) Pain Score: 2  Pain Type: Acute pain Pain Location: Knee Pain Orientation: Right Pain Descriptors / Indicators: Aching Pain Intervention(s): Repositioned  See Function Navigator for Current Functional Status.   Therapy/Group: Individual Therapy  Lowella Grip 09/28/2015, 12:29 PM

## 2015-09-28 NOTE — Progress Notes (Signed)
Patient information reviewed and entered into eRehab system by Tonimarie Gritz, RN, CRRN, PPS Coordinator.  Information including medical coding and functional independence measure will be reviewed and updated through discharge.    

## 2015-09-28 NOTE — Evaluation (Signed)
Occupational Therapy Assessment and Plan  Patient Details  Name: Dana Abbott MRN: [id number] Date of Birth: 1965/06/13  OT Diagnosis: acute pain and muscle weakness (generalized) Rehab Potential: Rehab Potential (ACUTE ONLY): Good ELOS: 10-14 days   Today's Date: 09/28/2015 OT Individual Time: 1000-1115 OT Individual Time Calculation (min): 75 min     Problem List:  Patient Active Problem List   Diagnosis Date Noted  . Debility 09/27/2015  . Morbid obesity due to excess calories ([place])   . Exertional dyspnea   . Requires supplemental oxygen   . Primary osteoarthritis of right knee   . Arterial hypotension   . Abnormality of gait   . Weakness of both legs   . Pain   . Fluid retention   . Hypoalbuminemia due to protein-calorie malnutrition ([place])   . Acute on chronic combined systolic and diastolic congestive heart failure ([place])   . Coronary artery disease involving native coronary artery of native heart without angina pectoris   . Iron deficiency anemia   . S/P gastric bypass   . Vitamin D deficiency   . OSA treated with BiPAP   . Hypotension due to drugs   . Pernicious anemia 09/22/2015  . CAD (coronary artery disease), native coronary artery 09/22/2015  . History of DVT (deep vein thrombosis) 09/22/2015  . Respiratory failure, acute ([place]) 09/22/2015  . CHF (congestive heart failure) ([place]) 09/22/2015  . Dyspnea 07/31/2015  . Acute on chronic diastolic heart failure ([place]) 07/30/2015  . Hypoxia 07/30/2015  . Blood loss anemia 07/30/2015    Past Medical History:  Past Medical History  Diagnosis Date  . CHF (congestive heart failure) ([place])   . Anemia   . DVT (deep venous thrombosis) ([place]) "after my heart attack"    "one of my legs"  . Heavy menses   . Myocardial infarction [facility]) 2003?  Marland Kitchen History of blood transfusion "I've had alot"    "I'm anemic"  . Arthritis     "knees" (07/31/2015)   Past Surgical History:  Past Surgical History  Procedure Laterality Date  .  Cesarean section  1985; 1991; 1993  . Gastric bypass  2004  . Tonsillectomy    . Carpal tunnel release Right   . Cholecystectomy  2004    w/gastric OR    Assessment & Plan Clinical Impression:  Shakinah Navis is a 50 y.o. female with history of severe obesity s/p gastric bypass, exertional dyspnea, CHF, non-obstructive CAD s/p MI 2003, recent admission 07/2015 for CP, SOB due to ABLA and acute exacerbation of CHF.  She was readmitted on 09/22/15 with progressive SOB, CP and BLE edema. BLE negative for DVT and Chest CT negative for PE.  Blood cultures and UCS negative for infection. She was treated with IV diuresis for management of acute respiratory failure due to CHF exacerbation and in setting of OHS. She was placed on  B12 injections due to recent diagnosis of pernicious anemia. 2 D echo showed EF 55% with no wall abnormality, mild LVH, moderately dilated RA and elevated pulmonary pressures. Has declined nutritional education regarding weight loss and has been educated on importance on vitamin supplementation.  She continues to be hypoxic requiring supplemental oxygen and noted to be severely debilitated. Therapy ongoing and CIR recommended by MD and rehab team.    Patient transferred to CIR on 09/27/2015 .    Patient currently requires min with basic self-care skills secondary to muscle weakness, decreased cardiorespiratoy endurance and decreased oxygen support and decreased standing balance and severe  R knee pain.  Prior to hospitalization, patient was fully independent using RW.  Patient will benefit from skilled intervention to increase independence with basic self-care skills prior to discharge home with care partner.  Anticipate patient will require intermittent supervision and follow up home health.  OT - End of Session Endurance Deficit: Yes Endurance Deficit Description: 2/2 fatigue & poor cardiovascular endurance OT Assessment Rehab Potential (ACUTE ONLY): Good OT Patient demonstrates  impairments in the following area(s): Balance;Endurance;Motor;Pain OT Basic ADL's Functional Problem(s): Bathing;Dressing;Toileting OT Advanced ADL's Functional Problem(s): Simple Meal Preparation;Light Housekeeping OT Transfers Functional Problem(s): Toilet;Tub/Shower OT Additional Impairment(s): None OT Plan OT Intensity: Minimum of 1-2 x/day, 45 to 90 minutes OT Frequency: 5 out of 7 days OT Duration/Estimated Length of Stay: 10-14 days OT Treatment/Interventions: Balance/vestibular training;Discharge planning;Pain management;Functional mobility training;Patient/family education;DME/adaptive equipment instruction;Self Care/advanced ADL retraining;Therapeutic Activities;Therapeutic Exercise;UE/LE Strength taining/ROM OT Self Feeding Anticipated Outcome(s): I OT Basic Self-Care Anticipated Outcome(s): mod I OT Toileting Anticipated Outcome(s): mod I OT Bathroom Transfers Anticipated Outcome(s): S OT Recommendation Patient destination: Home Follow Up Recommendations: Home health OT Equipment Recommended: Tub/shower bench   Skilled Therapeutic Intervention Pt seen for initial evaluation and ADL retraining, reviewed purpose of OT , expected LOS and goals. Pt is very eager to go home in less than a week. Reviewed fall concerns along with severe R knee pain. Pt opted to be on room air during session stating she did not feel SOB.  Pt bathed and dressed at sink with close S to touching A to sit to stand and with transfers.  She was extremely uncomfortable in bariatric w/c. A 24 " wide chair obtained that is somewhat tight on her thighs, but pt stated she is more comfortable in that chair.  Pt wanted to sit in recliner but it was too narrow. A wide recliner obtained and placed in her room. Pt planned on working on that transfer during the next session.  Pt in room with all needs met.  OT Evaluation Precautions/Restrictions  Precautions Precautions: Fall Precaution Comments: limited by R knee  pain Restrictions Weight Bearing Restrictions: No Vital Signs Therapy Vitals Pulse Rate: 92 BP: 113/80 mmHg Patient Position (if appropriate): Sitting Oxygen Therapy SpO2: 93 % (at rest) O2 Device: Not Delivered Pain Pain Assessment Pain Assessment: 0-10 (pt reports this is baseline) Pain Score: 2  Pain Type: Acute pain Pain Location: Knee Pain Orientation: Right Pain Descriptors / Indicators: Aching Pain Intervention(s): Repositioned Home Living/Prior Functioning Home Living Available Help at Discharge: Family, Available 24 hours/day (son - 24 hr assist) Type of Home: Apartment Home Access: Level entry Home Layout: One level Bathroom Shower/Tub: Chiropodist: Standard Additional Comments:  (pt has rollator & SPC)  Lives With: Daughter (daughter & grandson) Prior Function Level of Independence: Independent with homemaking with ambulation, Independent with transfers, Independent with gait, Independent with basic ADLs (intermittent use of rollator)  Able to Take Stairs?: Yes Driving: Yes Vocation:  (working up until 1 month ago then fired from job) Leisure: Hobbies-yes (Comment) Comments:  (traveling & baking) ADL  refer to functional navigator Vision/Perception  Vision- History Baseline Vision/History: Wears glasses Wears Glasses: At all times (removes glasses when reading) Patient Visual Report: No change from baseline Vision- Assessment Vision Assessment?: No apparent visual deficits  Cognition Overall Cognitive Status: Within Functional Limits for tasks assessed Arousal/Alertness: Awake/alert Orientation Level: Person;Place;Situation Person: Oriented Place: Oriented Situation: Oriented Year: 2017 Month: June Day of Week: Correct Memory: Appears intact Immediate Memory Recall: Sock;Blue;Bed Memory Recall: Blue;Bed;Sock  Memory Recall Sock: Without Cue Memory Recall Blue: Without Cue Memory Recall Bed: Without Cue Attention:  Focused Focused Attention: Appears intact Awareness: Appears intact Problem Solving: Appears intact Safety/Judgment: Appears intact Sensation Sensation Light Touch: Appears Intact (BLE) Stereognosis: Appears Intact Hot/Cold: Appears Intact Proprioception: Appears Intact (BLE) Coordination Gross Motor Movements are Fluid and Coordinated: Yes Fine Motor Movements are Fluid and Coordinated: Yes Motor  Motor Motor:  (significant BLE weakness) Motor - Skilled Clinical Observations: significant LE weakness Mobility  Bed Mobility Bed Mobility: Supine to Sit Supine to Sit: 5: Supervision (with bed rails) Supine to Sit Details (indicate cue type and reason):  (increased time needed) Transfers Sit to Stand: 4: Min assist Sit to Stand Details: Verbal cues for technique  Trunk/Postural Assessment  Cervical Assessment Cervical Assessment: Within Functional Limits Thoracic Assessment Thoracic Assessment: Exceptions to [facility] (severe kyphosis in standing, moderate seated) Lumbar Assessment Lumbar Assessment: Exceptions to [facility] (posterior tilt) Postural Control Postural Control: Within Functional Limits  Balance Balance Balance Assessed: Yes Static Sitting Balance Static Sitting - Balance Support: Bilateral upper extremity supported Static Sitting - Level of Assistance: 6: Modified independent (Device/Increase time) Static Standing Balance Static Standing - Balance Support: Bilateral upper extremity supported Static Standing - Level of Assistance: 4: Min assist (with RW) Extremity/Trunk Assessment RUE Assessment RUE Assessment: Within Functional Limits LUE Assessment LUE Assessment: Within Functional Limits   See Function Navigator for Current Functional Status.   Refer to Care Plan for Long Term Goals  Recommendations for other services: None  Discharge Criteria: Patient will be discharged from OT if patient refuses treatment 3 consecutive times without medical reason, if  treatment goals not met, if there is a change in medical status, if patient makes no progress towards goals or if patient is discharged from hospital.  The above assessment, treatment plan, treatment alternatives and goals were discussed and mutually agreed upon: by patient  [facility] 09/28/2015, 12:53 PM

## 2015-09-28 NOTE — Progress Notes (Signed)
Social Work  Social Work Assessment and Plan  Patient Details  Name: Dana Abbott MRN: [id number] Date of Birth: 1965/05/29  Today's Date: 09/28/2015  Problem List:  Patient Active Problem List   Diagnosis Date Noted  . Debility 09/27/2015  . Morbid obesity due to excess calories ([facility])   . Exertional dyspnea   . Requires supplemental oxygen   . Primary osteoarthritis of right knee   . Arterial hypotension   . Abnormality of gait   . Weakness of both legs   . Pain   . Fluid retention   . Hypoalbuminemia due to protein-calorie malnutrition ([facility])   . Acute on chronic combined systolic and diastolic congestive heart failure ([facility])   . Coronary artery disease involving native coronary artery of native heart without angina pectoris   . Iron deficiency anemia   . S/P gastric bypass   . Vitamin D deficiency   . OSA treated with BiPAP   . Hypotension due to drugs   . Pernicious anemia 09/22/2015  . CAD (coronary artery disease), native coronary artery 09/22/2015  . History of DVT (deep vein thrombosis) 09/22/2015  . Respiratory failure, acute ([facility]) 09/22/2015  . CHF (congestive heart failure) ([facility]) 09/22/2015  . Dyspnea 07/31/2015  . Acute on chronic diastolic heart failure ([facility]) 07/30/2015  . Hypoxia 07/30/2015  . Blood loss anemia 07/30/2015   Past Medical History:  Past Medical History  Diagnosis Date  . CHF (congestive heart failure) ([facility])   . Anemia   . DVT (deep venous thrombosis) ([facility]) "after my heart attack"    "one of my legs"  . Heavy menses   . Myocardial infarction [facility]) 2003?  Marland Kitchen History of blood transfusion "I've had alot"    "I'm anemic"  . Arthritis     "knees" (07/31/2015)   Past Surgical History:  Past Surgical History  Procedure Laterality Date  . Cesarean section  1985; 1991; 1993  . Gastric bypass  2004  . Tonsillectomy    . Carpal tunnel release Right   . Cholecystectomy  2004    w/gastric OR   Social History:  reports that she has never  smoked. She has never used smokeless tobacco. She reports that she does not drink alcohol or use illicit drugs.  Family / Support Systems Marital Status: Single Patient Roles: Parent Children: Cassandra-daughter whom she lives with   Bunnie Domino [phone] recenlty moved here to help out Mom Other Supports: Gloria-Mom  [phone]-cell  Anticipated Caregiver: Mena Goes and Elonda Husky will assist Ability/Limitations of Caregiver: Mena Goes can stay with while his sister works, he recently moved here to [place]. Staying with his grandmother-pt's mother Caregiver Availability: 24/7 Family Dynamics: Pt has three children all of which are involved and supportive. Two children are local and will assist her other daughter is in [place]. Her Mom is also involved and is in good health and can assist if needed. Pt feels very fortuanate to have her children as a support.  Social History Preferred language: English Religion: Christian Cultural Background: No issues Education: High School Read: Yes Write: Yes Employment Status: Unemployed Date Retired/Disabled/Unemployed: April 2017 when was ill-worked in Astron[employer] Issues: No issues Guardian/Conservator: None-according to MD pt is capable of making her own decisions while here.    Abuse/Neglect Physical Abuse: Denies Verbal Abuse: Denies Sexual Abuse: Denies Exploitation of patient/patient's resources: Denies Self-Neglect: Denies  Emotional Status Pt's affect, behavior adn adjustment status: Pt is motivated and feels her medical issues have been dealt with this time,  in April was not doing well after hospitalization. Then came back in in May for the same health issue. She is feeling muich better and ready to get stronger and moving again. She wants to be independent by the time she leaves here if she can. Recent Psychosocial Issues: other health issues-has been hospitalized multiple times in the past six  months. Pyschiatric History: No history deferred depression screen due to pt exhausted from two back to back therapies. She wants to catch her breath. She is still adjusting to the rehab unit and processing all that has happened to her. She would benefit form seeing Neuro-psych while here for coping. Will let her adjust to new unit and make referral. Substance Abuse History: No issues  Patient / Family Perceptions, Expectations & Goals Pt/Family understanding of illness & functional limitations: Pt is able to expain her medical conditions and does talk with the MD who rounds daily to ask questions. She feels her questions are being addressed and concerns heard. She wants to get moving and stronger now she feels better. Premorbid pt/family roles/activities: Daughter, Mom, friend, employee, etc Anticipated changes in roles/activities/participation: resume Pt/family expectations/goals: Pt states: " I want to do as much as I can for myself before I leave here."  Mom states: " I am just glad she is better and can recover from this."  [employer]: None Premorbid Home Care/DME Agencies: Other (Comment) (has a walker and cane she used prior to admission due to bad knee) Transportation available at discharge: Family Resource referrals recommended: Neuropsychology, Support group (specify)  Discharge Planning Living Arrangements: Children Support Systems: Children, Manufacturing engineer, Parent Type of Residence: Private residence Insurance Resources: Customer service manager Resources: Employment, Garment/textile technologist Screen Referred: Yes Living Expenses: Rent Money Management: Patient, Family Does the patient have any problems obtaining your medications?: Yes (Describe) (has no insurance for medications) Home Management: patient and daughter Patient/Family Preliminary Plans: Return to apartment with daughter and her 2 yo son-pt's grandson. Her son-Quinton will assist until pt  is independent and able to be safe alone while Lynnae Prude is working. Will set up pt with PCP via medical clinic and work on disability and medicaid if MD feels wil be disabled for atleast a year. Social Work Anticipated Follow Up Needs: HH/OP, Support Group  Clinical Impression Pleasant female who is motivated to recover and regain her strength to be mobile again. She does feels better and is ready to work in therapies. She has supportive children and Mom who are willing to assist her and provide care. Will work on PCP for pt's medical follow up and provide support while here. Would benefit from neuro-psych seeing while here will wait for her to adjust to unit and make referral. Will ask MD if feels would be disabled for a year and Then have apply for disability and Medicaid. Aware team conference tomorrow will discuss goals and target discharge date.  Lucy Chris 09/28/2015, 1:05 PM

## 2015-09-28 NOTE — Evaluation (Signed)
Physical Therapy Assessment and Plan  Patient Details  Name: Dana Abbott MRN: [id number] Date of Birth: [birth year]  PT Diagnosis: Abnormality of gait, Difficulty walking, Muscle weakness and Pain in joint Rehab Potential: Good ELOS: 10-14 days   Today's Date: 09/28/2015 PT Individual Time: 4098-1191 and 4782-9562 PT Individual Time Calculation (min): 89 min and 26 min  Problem List:  Patient Active Problem List   Diagnosis Date Noted  . Debility 09/27/2015  . Morbid obesity due to excess calories ([place])   . Exertional dyspnea   . Requires supplemental oxygen   . Primary osteoarthritis of right knee   . Arterial hypotension   . Abnormality of gait   . Weakness of both legs   . Pain   . Fluid retention   . Hypoalbuminemia due to protein-calorie malnutrition ([place])   . Acute on chronic combined systolic and diastolic congestive heart failure ([place])   . Coronary artery disease involving native coronary artery of native heart without angina pectoris   . Iron deficiency anemia   . S/P gastric bypass   . Vitamin D deficiency   . OSA treated with BiPAP   . Hypotension due to drugs   . Pernicious anemia 09/22/2015  . CAD (coronary artery disease), native coronary artery 09/22/2015  . History of DVT (deep vein thrombosis) 09/22/2015  . Respiratory failure, acute ([place]) 09/22/2015  . CHF (congestive heart failure) ([place]) 09/22/2015  . Dyspnea 07/31/2015  . Acute on chronic diastolic heart failure ([place]) 07/30/2015  . Hypoxia 07/30/2015  . Blood loss anemia 07/30/2015    Past Medical History:  Past Medical History  Diagnosis Date  . CHF (congestive heart failure) ([place])   . Anemia   . DVT (deep venous thrombosis) ([place]) "after my heart attack"    "one of my legs"  . Heavy menses   . Myocardial infarction [facility]) 2003?  Marland Kitchen History of blood transfusion "I've had alot"    "I'm anemic"  . Arthritis     "knees" (07/31/2015)   Past Surgical History:  Past Surgical History  Procedure  Laterality Date  . Cesarean section  1985; 1991; 1993  . Gastric bypass  2004  . Tonsillectomy    . Carpal tunnel release Right   . Cholecystectomy  2004    w/gastric OR    Assessment & Plan Clinical Impression: Patient is a 50 y.o. year old female with history of severe obesity s/p gastric bypass, exertional dyspnea, CHF, non-obstructive CAD s/p MI 2003, recent admission 07/2015 for CP, SOB due to ABLA and acute exacerbation of CHF. She was readmitted on 09/22/15 with progressive SOB, CP and BLE edema. BLE negative for DVT and Chest CT negative for PE. Blood cultures and UCS negative for infection. She was treated with IV diuresis for management of acute respiratory failure due to CHF exacerbation and in setting of OHS. She was placed on B12 injections due to recent diagnosis of pernicious anemia. 2 D echo showed EF 55% with no wall abnormality, mild LVH, moderately dilated RA and elevated pulmonary pressures. Has declined nutritional education regarding weight loss and has been educated on importance on vitamin supplementation. She continues to be hypoxic requiring supplemental oxygen and noted to be severely debilitated. Therapy ongoing and CIR recommended by MD and rehab team. Patient transferred to CIR on 09/27/2015 .   Patient currently requires min with mobility secondary to muscle weakness, decreased cardiorespiratoy endurance and decreased oxygen support and decreased standing balance.  Prior to hospitalization, patient was independent  with  mobility and lived with Daughter (daughter & grandson) in [place].  Home access is  Level entry.  Patient will benefit from skilled PT intervention to maximize safe functional mobility, minimize fall risk and decrease caregiver burden for planned discharge home with 24 hour supervision.  Anticipate patient will benefit from follow up [place] at discharge.  PT - End of Session Activity Tolerance: Tolerates 30+ min activity with multiple  rests Endurance Deficit: Yes Endurance Deficit Description: 2/2 fatigue & poor cardiovascular endurance PT Assessment Rehab Potential (ACUTE/IP ONLY): Good PT Patient demonstrates impairments in the following area(s): Balance;Endurance;Motor;Pain;Safety;Sensory PT Transfers Functional Problem(s): Bed to Chair;Car;Bed Mobility;Furniture PT Locomotion Functional Problem(s): Ambulation;Wheelchair Mobility;Stairs PT Plan PT Intensity: Minimum of 1-2 x/day ,45 to 90 minutes PT Frequency: 5 out of 7 days PT Duration Estimated Length of Stay: 10-14 days PT Treatment/Interventions: Ambulation/gait training;Discharge planning;Functional mobility training;Psychosocial support;Therapeutic Activities;Wheelchair propulsion/positioning;Therapeutic Exercise;Skin care/wound management;Neuromuscular re-education;Disease management/prevention;Balance/vestibular training;DME/adaptive equipment instruction;Pain management;UE/LE Strength taining/ROM;Stair training;Patient/family education;Community reintegration PT Transfers Anticipated Outcome(s): supervision PT Locomotion Anticipated Outcome(s): supervision PT Recommendation Follow Up Recommendations: Home health PT Patient destination: Home Equipment Recommended:  (bariatric RW, potential need for w/c for community mobility)  Skilled Therapeutic Intervention Treatment 1: Pt received long sitting in bed & PT evaluation initiated. Educated pt on PT role, CIR schedule, & weekly interdisciplinary team meetings to determine length of stay. Manual testing completed, please see below for further details. On room air at rest pt's SpO2 = 93-96% but dropped to 84% after stand pivot transfer, pt placed on 2L/min supplemental oxygen for remainder of session & maintained >90% saturations. Pt reports she becomes SOB at baseline, but not as quickly as compared to present. Pt able to complete stand pivot & sit<>stand transfers with Min A & RW. Pt able to transfer into car with  Min A for RW stability & management of oxygen tubing. Pt unable to transfer BLE into car 2/2 body habitus. Gait training x 3 ft with bariatric RW & min A. Overall, pt is currently at a Min A level of function and is limited by R knee pain with all activity. At end of session pt left in w/c with all needs within reach.   Treatment 2: Pt received in recliner & agreeable to PT, denying c/o pain. Pt completed sit<>stand transfer with supervision with PT cuing to push up on armrests. Pt utilized momentum to assist with sit>sand; stand pivot completed with bariatric RW & supervision A with PT managing oxygen tubing. Pt reported she didn't feel like she needed it but PT reinforced need to wear it to prevent drop in oxygen saturation; SpO2 =100% on 2L/min at rest. Transported pt room>gym via w/c total A for time management. Performed stair negotiation at 3 inch steps with B rails & cuing to negotiate stairs with compensatory technique but pt wanted to ascend & descend steps leading with RLE. Pt able to negotiate single step with B rails x 2 with seated rest break in between & difficulty transferring LLE off of step due to limited weight bearing on RLE. Pt transported back to room in w/c & completed stand pivot w/c>recliner in same manner as noted above. Pt left in recliner with all needs within reach, on 2L/min supplemental oxygen & family present to supervise.  PT Evaluation Precautions/Restrictions Precautions Precautions: Fall Precaution Comments: limited by R knee pain Restrictions Weight Bearing Restrictions: No   General Chart Reviewed: Yes Additional Pertinent History: obesity Response to Previous Treatment: Not applicable Family/Caregiver Present: No   Vital  Signs Therapy Vitals Pulse Rate: 92 BP: 113/80 mmHg Patient Position (if appropriate): Sitting Oxygen Therapy SpO2: 93 % (at rest) O2 Device: Not Delivered  Pain Pain Assessment Pain Assessment: 0-10 (pt reports this is  baseline) Pain Score: 2  Pain Type: Acute pain Pain Location: Knee Pain Orientation: Right Pain Descriptors / Indicators: Aching Pain Intervention(s): Repositioned  Home Living/Prior Functioning Home Living Available Help at Discharge: Family;Available 24 hours/day (son - 24 hr assist) Type of Home: Apartment Home Access: Level entry Home Layout: One level Bathroom Shower/Tub: Chiropodist: Standard Additional Comments:  (pt has rollator & SPC)  Lives With: Daughter (daughter & grandson) Prior Function Level of Independence: Independent with homemaking with ambulation;Independent with transfers;Independent with gait;Independent with basic ADLs (intermittent use of rollator)  Able to Take Stairs?: Yes Driving: Yes Vocation:  (working up until 1 month ago then fired from job) Leisure: Hobbies-yes (Comment) Comments:  (traveling & baking)  Vision/Perception   Wears glasses at baseline, does not wear glasses for reading but wears them at all other times. Pt reports no changes in baseline vision since admission to CIR.  Cognition Overall Cognitive Status: Within Functional Limits for tasks assessed Arousal/Alertness: Awake/alert Orientation Level: Oriented X4;Oriented to person;Oriented to place;Oriented to time;Oriented to situation Attention: Focused Focused Attention: Appears intact Memory: Appears intact Awareness: Appears intact Problem Solving: Appears intact Safety/Judgment: Appears intact  Sensation Sensation Light Touch: Appears Intact (BLE) Proprioception: Appears Intact (BLE) Coordination Gross Motor Movements are Fluid and Coordinated: Yes  Motor  Motor Motor:  (significant BLE weakness)   Mobility Bed Mobility Bed Mobility: Supine to Sit Supine to Sit: 5: Supervision (with bed rails) Supine to Sit Details (indicate cue type and reason):  (increased time needed) Transfers Transfers: Yes Sit to Stand: 4: Min assist Sit to Stand  Details: Verbal cues for technique Stand Pivot Transfers: 4: Min Psychologist, occupational Details: Verbal cues for sequencing;Verbal cues for technique Stand Pivot Transfer Details (indicate cue type and reason):  (with RW)  Locomotion  Ambulation Ambulation: Yes Ambulation/Gait Assistance: 4: Min assist Assistive device: Rolling walker (bariatric) Gait Gait: Yes Gait Pattern: Decreased step length - left;Decreased step length - right;Decreased stride length (antalgic R) Gait velocity:  (significantly decreased) Stairs / Additional Locomotion Stairs: Yes Stairs Assistance: 4: Min assist Stair Management Technique: Two rails Height of Stairs: 3 (inches) Wheelchair Mobility Wheelchair Mobility: No   Balance Balance Balance Assessed: Yes Static Sitting Balance Static Sitting - Balance Support: Bilateral upper extremity supported Static Sitting - Level of Assistance: 6: Modified independent (Device/Increase time) Static Standing Balance Static Standing - Balance Support: Bilateral upper extremity supported Static Standing - Level of Assistance: 4: Min assist (with RW)  Extremity Assessment  RLE Assessment RLE Assessment: Exceptions to WFL (ROM limited by R knee pain & body habitus) RLE Strength Right Knee Flexion: 2/5 Right Knee Extension: 2/5 Right Ankle Dorsiflexion: 2/5 LLE Assessment LLE Assessment: Exceptions to [facility] LLE Strength Left Knee Flexion: 4/5 Left Knee Extension: 3+/5 Left Ankle Dorsiflexion: 3+/5   See Function Navigator for Current Functional Status.   Refer to Care Plan for Long Term Goals  Recommendations for other services: None  Discharge Criteria: Patient will be discharged from PT if patient refuses treatment 3 consecutive times without medical reason, if treatment goals not met, if there is a change in medical status, if patient makes no progress towards goals or if patient is discharged from hospital.  The above assessment, treatment  plan, treatment alternatives and goals  were discussed and mutually agreed upon: by patient  Waunita Schooner 09/28/2015, 12:35 PM

## 2015-09-28 NOTE — Care Management Note (Signed)
[facility] Individual Statement of Services  Patient Name:  Dana Abbott  Date:  09/28/2015  Welcome to the [facility]patient [facility].  Our goal is to provide you with an individualized program based on your diagnosis and situation, designed to meet your specific needs.  With this comprehensive rehabilitation program, you will be expected to participate in at least 3 hours of rehabilitation therapies Monday-Friday, with modified therapy programming on the weekends.  Your rehabilitation program will include the following services:  Physical Therapy (PT), Occupational Therapy (OT), 24 hour per day rehabilitation nursing, Therapeutic Recreaction (TR), Case Management (Social Worker), Rehabilitation Medicine, Nutrition Services and Pharmacy Services  Weekly team conferences will be held on Wednesday to discuss your progress.  Your Social Worker will talk with you frequently to get your input and to update you on team discussions.  Team conferences with you and your family in attendance may also be held.  Expected length of stay: 10-14 days  Overall anticipated outcome: supervision with set-up  Depending on your progress and recovery, your program may change. Your Social Worker will coordinate services and will keep you informed of any changes. Your Social Worker's name and contact numbers are listed  below.  The following services may also be recommended but are not provided by the Inpatient [facility]:   Driving Evaluations  Home Health Rehabiltiation Services  Outpatient Rehabilitation Services  Vocational Rehabilitation   Arrangements will be made to provide these services after discharge if needed.  Arrangements include referral to agencies that provide these services.  Your insurance has been verified to be:  Self Pay-medicaid pending Your primary doctor is:  None  Pertinent information will be shared with your doctor and your insurance  company.  Social Worker:  Dossie Der, SW [phone] or ([phone]  Information discussed with and copy given to patient by: Lucy Chris, 09/28/2015, 10:08 AM

## 2015-09-29 ENCOUNTER — Inpatient Hospital Stay (HOSPITAL_COMMUNITY): Payer: PRIVATE HEALTH INSURANCE | Admitting: Physical Therapy

## 2015-09-29 ENCOUNTER — Inpatient Hospital Stay (HOSPITAL_COMMUNITY): Payer: PRIVATE HEALTH INSURANCE | Admitting: Occupational Therapy

## 2015-09-29 DIAGNOSIS — E538 Deficiency of other specified B group vitamins: Secondary | ICD-10-CM | POA: Diagnosis present

## 2015-09-29 DIAGNOSIS — E8770 Fluid overload, unspecified: Secondary | ICD-10-CM | POA: Diagnosis not present

## 2015-09-29 DIAGNOSIS — I5043 Acute on chronic combined systolic (congestive) and diastolic (congestive) heart failure: Secondary | ICD-10-CM | POA: Diagnosis not present

## 2015-09-29 DIAGNOSIS — R5381 Other malaise: Secondary | ICD-10-CM | POA: Diagnosis not present

## 2015-09-29 DIAGNOSIS — R269 Unspecified abnormalities of gait and mobility: Secondary | ICD-10-CM | POA: Diagnosis not present

## 2015-09-29 MED ORDER — ENOXAPARIN SODIUM 80 MG/0.8ML ~~LOC~~ SOLN
80.0000 mg | SUBCUTANEOUS | Status: DC
  Administered 2015-09-29 – 2015-10-05 (×7): 80 mg via SUBCUTANEOUS
  Filled 2015-09-29 (×7): qty 0.8

## 2015-09-29 MED ORDER — FUROSEMIDE 40 MG PO TABS
40.0000 mg | ORAL_TABLET | Freq: Two times a day (BID) | ORAL | Status: DC
  Administered 2015-09-29 – 2015-10-05 (×13): 40 mg via ORAL
  Filled 2015-09-29 (×14): qty 1

## 2015-09-29 NOTE — Progress Notes (Signed)
Physical Therapy Session Note  Patient Details  Name: Dana Abbott MRN: [id number] Date of Birth: 11/09/1965  Today's Date: 09/29/2015 PT Individual Time: 5003-7048 PT Individual Time Calculation (min): 56 min   Short Term Goals: Week 1:  PT Short Term Goal 1 (Week 1): Pt will complete bed mobility in regular bed with Min A. PT Short Term Goal 2 (Week 1): Pt will ambulate 20 ft with LRAD & min A.  Skilled Therapeutic Interventions/Progress Updates:    Pt received in w/c completing donning clothing noting 0/10 pain. Pt completed donning new clothing independently, but required total A to remove pants off LE. Pt reports she can independently perform this at home due to lower seat that she does not have here. Pt appears to have made multiple modifications at home to increase ease of daily tasks. Provided pt with oxygen connector and reinforced pt's need for supplemental oxygen at all times as pt removes it at will while sitting in room or ambulating to bathroom. Pt's SpO2 decreased to 84% on room air when ambulating 5 ft w/c>recliner & PT reinforced need for oxygen. Pt placed on 2L/min supplemental oxygen via nasal cannula for remainder of session. SpO2 did drop after ambulation but increased quickly >90% with pursed lip breathing. Pt ambulated 20 ft + 15 ft + 25 ft with w/c follow, steady A from PT, and sitting rest breaks in between 2/2 R knee pain. In gym pt negotiated 8 steps, 3 inches high, with B rails & steady A fading to supervision. Pt continues to self select to ascend & descend leading with RLE. Pt with audible crepitus in R knee during ambulation & stairs. Pt reported need to use restroom; PT transported pt back to room via w/c total A. In room pt able to ambulate 5 ft + 10 ft with RW, complete toilet transfers with supervision & perform hygiene independently. At end of session pt left sitting in recliner with all needs within reach.  During session pt verbalized frustration with RW &  stated she would prefer to use rollator. Educated pt that rollator does not have brakes despite pt's thoughts, and rollator moves too quickly for her at this point. Educated pt on need to continue to use RW for continued safety & decreased speed.   Therapy Documentation Precautions:  Precautions Precautions: Fall Precaution Comments: limited by R knee pain Restrictions Weight Bearing Restrictions: No   See Function Navigator for Current Functional Status.   Therapy/Group: Individual Therapy  Sandi Mariscal 09/29/2015, 7:53 AM

## 2015-09-29 NOTE — Progress Notes (Signed)
Occupational Therapy Session Note  Patient Details  Name: Dana Abbott MRN: [id number] Date of Birth: 07/19/1965  Today's Date: 09/29/2015 OT Individual Time: 1405-1500 OT Individual Time Calculation (min): 55 min   Short Term Goals: Week 1:  OT Short Term Goal 1 (Week 1): Pt will be able to tolerate standing for at least 5 minutes to enable her to do light house keeping.  OT Short Term Goal 2 (Week 1): Pt will be able to transfer into bathtub with min A using tub bench. OT Short Term Goal 3 (Week 1): Pt will be able to don socks or shoes without A using AE if needed.  Skilled Therapeutic Interventions/Progress Updates:  Patient found seated in recliner. Pt with pain in right knee, 4/10 on face scale as no number given. Pt reported she plans to put medicine/rub on her knee at end of therapy session. Educated pt on theraband exercises, handout administered, and pt went through each exercise 10X each in seated position. Pt stood with RW and ambulated to w/c. Therapist assisted pt to tub room and educated pt on use of tub transfer bench in tub/shower combo and pt stated she plans to talk with her children about purchasing one. Therapist assisted pt to therapy gym and engaged pt on therapeutic activity focusing on dynamic standing balance/tolerance/endurance reaching across midline and right to/from left to strength core and standing balance independence. At end of session, therapist assisted pt back to room, assisted her back into recliner and left her seated in recliner with all needs within reach and family present in room.  Pt on 2L/min supplemental 02 via [place] entire session.   Therapy Documentation Precautions:  Precautions Precautions: Fall Precaution Comments: limited by R knee pain Restrictions Weight Bearing Restrictions: No  Vital Signs: Therapy Vitals Temp: 98.1 F (36.7 C) Temp Source: Oral Pulse Rate: 79 Resp: 20 BP: (!) 102/58 mmHg Patient Position (if appropriate):  Sitting Oxygen Therapy SpO2: 100 % O2 Device: Nasal Cannula O2 Flow Rate (L/min): 2 L/min  See Function Navigator for Current Functional Status.  Therapy/Group: Individual Therapy  Edwin Cap , MS, OTR/L, CLT 09/29/2015, 5:04 PM

## 2015-09-29 NOTE — Progress Notes (Signed)
Physical Therapy Session Note  Patient Details  Name: Dana Abbott MRN: [id number] Date of Birth: [birth year]  Today's Date: 09/29/2015 PT Individual Time: 1302-1401 PT Individual Time Calculation (min): 59 min   Short Term Goals: Week 1:  PT Short Term Goal 1 (Week 1): Pt will complete bed mobility in regular bed with Min A. PT Short Term Goal 2 (Week 1): Pt will ambulate 20 ft with LRAD & min A.  Skilled Therapeutic Interventions/Progress Updates:    Patient received sitting on couch in room and agreeable to PT. Patient transported to ortho gym with total A for energy conservation Gait training for 20 ft x 2 with 5 minute rest break between bouts; PT provided supervision A with gait training with cues for improved weight shift. Audible crepitus noted throughout gait training with increased pain with increased distance. Patient's SpO2 desat to 84% following gait training with 1.5 minutes to increased to >90%.   WC mobility. X 35f with min A-mod A from PT to maintain forward progression as well as cues for positioning in chair to allow improved mechanical advantage and increased use of BUE to maintain straight trajectory.   Balance training/ standing tolerance with cross body reaching to target x 15 BUE with supervision  A from PT.   Throughout treatment patient performed sit<>stand x 8 transfers with supervision A from various sitting surfaces. PT provided mod cues for UE postioning and improved use of AD for maintain balance.   Patient left sitting on couch in room with call bell with in reach and all needs met.   Therapy Documentation Precautions:  Precautions Precautions: Fall Precaution Comments: limited by R knee pain Restrictions Weight Bearing Restrictions: No      See Function Navigator for Current Functional Status.   Therapy/Group: Individual Therapy  [name]6/10/2015, 2:16 PM

## 2015-09-29 NOTE — Progress Notes (Signed)
[place] PHYSICAL MEDICINE & REHABILITATION     PROGRESS NOTE  Subjective/Complaints:  Feels that feet are more swollen over the last 24 hours. Trying to elevate them slightly when she sits.   ROS: Denies CP, SOB, N/V/D  Objective: Vital Signs: Blood pressure 116/70, pulse 101, temperature 98.1 F (36.7 C), temperature source Oral, resp. rate 20, weight 175.4 kg (386 lb 11 oz), last menstrual period 09/03/2015, SpO2 98 %. No results found.  Recent Labs  09/28/15 0518  WBC 4.3  HGB 9.7*  HCT 37.2  PLT 164    Recent Labs  09/27/15 0550 09/28/15 0518  NA 138 140  K 3.8 4.3  CL 97* 98*  GLUCOSE 101* 105*  BUN 10 11  CREATININE 0.62 0.58  CALCIUM 8.3* 8.9   CBG (last 3)  No results for input(s): GLUCAP in the last 72 hours.  Wt Readings from Last 3 Encounters:  09/29/15 175.4 kg (386 lb 11 oz)  09/27/15 175.451 kg (386 lb 12.8 oz)  08/28/15 166.47 kg (367 lb)    Physical Exam:  BP 116/70 mmHg  Pulse 101  Temp(Src) 98.1 F (36.7 C) (Oral)  Resp 20  Wt 175.4 kg (386 lb 11 oz)  SpO2 98%  LMP 09/03/2015 (Approximate) Constitutional: She appears well-developed and well-nourished. NAD HENT: Normocephalic and atraumatic.  Eyes: Conjunctivae and EOM are normal.  Cardiovascular: Normal rate and regular rhythm. No murmur heard. Respiratory: Effort normal. No stridor. No respiratory distress. She has no wheezes.  GI: Soft. Bowel sounds are normal. She exhibits no distension. There is no tenderness.  Musculoskeletal: She exhibits edema and tenderness.  Pedal edema bilaterally.1++   Neurological: She is alert and oriented.  Sensation intact to light touch Motor: B/l UE: 5/5 proximal to distal RLE: Hip flexion 3-/5, knee extension 4-/5, ankle dorsi/plantar flexion 4+/5 LLE: Hip flexion, knee extension 4+/5, ankle doris/plantar flexion 5/5  Skin: Skin is warm and dry. No rash noted. No erythema.  Psychiatric: She has a normal mood and affect. Her behavior is  normal. Judgment and thought content normal.    Assessment/Plan: 1. Functional deficits secondary to deconditioning which require 3+ hours per day of interdisciplinary therapy in a comprehensive inpatient rehab setting. Physiatrist is providing close team supervision and 24 hour management of active medical problems listed below. Physiatrist and rehab team continue to assess barriers to discharge/monitor patient progress toward functional and medical goals.  Function:  Bathing Bathing position   Position: Wheelchair/chair at sink  Bathing parts Body parts bathed by patient: Right arm, Left arm, Chest, Abdomen, Front perineal area, Buttocks, Right upper leg, Left upper leg    Bathing assist Assist Level: Set up      Upper Body Dressing/Undressing Upper body dressing   What is the patient wearing?: Pull over shirt/dress     Pull over shirt/dress - Perfomed by patient: Thread/unthread right sleeve, Thread/unthread left sleeve, Put head through opening, Pull shirt over trunk          Upper body assist Assist Level: Set up      Lower Body Dressing/Undressing Lower body dressing   What is the patient wearing?: Non-skid slipper socks (skirt)           Non-skid slipper socks- Performed by helper: Don/doff right sock, Don/doff left sock                  Lower body assist Assist for lower body dressing: Supervision or verbal cues      Toileting Toileting  Toileting steps completed by patient: Adjust clothing prior to toileting, Performs perineal hygiene, Adjust clothing after toileting Toileting steps completed by helper: Performs perineal hygiene    Toileting assist Assist level: Touching or steadying assistance (Pt.75%)   Transfers Chair/bed transfer   Chair/bed transfer method: Stand pivot Chair/bed transfer assist level: Supervision or verbal cues Chair/bed transfer assistive device: Patent attorney     Max distance: 6' Assist level:  Touching or steadying assistance (Pt > 75%)   Wheelchair Wheelchair activity did not occur: Safety/medical concerns        Cognition Comprehension Comprehension assist level: Follows complex conversation/direction with no assist  Expression Expression assist level: Expresses complex ideas: With no assist  Social Interaction Social Interaction assist level: Interacts appropriately with others - No medications needed.  Problem Solving Problem solving assist level: Solves complex problems: Recognizes & self-corrects  Memory Memory assist level: Complete Independence: No helper    Medical Problem List and Plan: 1. RLE weakness, abnormality of gait secondary to deconditioning.   -continue CIR therapies.  2. DVT Prophylaxis/Anticoagulation: Pharmaceutical: Lovenox 3. Pain Management: Mobic daily. Added sportscreme as well as ice for local measures.  4. Mood: LCSW to follow for evaluation and support.  5. Neuropsych: This patient is capable of making decisions on her own behalf. 6. Skin/Wound Care: routine pressure relief measures.  7. Fluids/Electrolytes/Nutrition: Added protein supplements between meals. Check lytes intermittently. Will continue Mobic for now with monitoring for recurrent fluid retention as well as renal status.   BMP within acceptable rage on 6/6 8. Acute on chronic CHF with acute hypoxic respiratory failure:  Weight UP today (not accurate---60lb increase!).   -recheck weight today and check daily  -will increase lasix to  bid today  -check lytes tomorrow 9. Non-obstructive CAD: Low dose ASA daily. 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 Educate patient on importance of compliance after discharge. 11. Vitamin D deficiency: On supplement bid with follow up recommended on 08/01. 12. OHS/OSA: BIPAP at nights and whenever asleep.  13. Hypotension: Due to diuresis. Monitor for symptoms as well as renal  status for AKI.  14. Severe Obesity: Heart healthy diet-- added low salt diet. Will have dietician follow up closer to discharge to see if patient willing to modify diet after discharge.  15. Anemia  Hb 9.7 on 6/6 (improving)  Will cont to monitor   LOS (Days) 2 A FACE TO FACE EVALUATION WAS PERFORMED  SWARTZ,ZACHARY T 09/29/2015 8:42 AM

## 2015-09-29 NOTE — Progress Notes (Signed)
Patient A/O, no noted distress. Participated in therapy. Patient refused A/D top, patient notes her face get dry when she does not wash her hair in days. Once she washes her hair, face will clear. Patient tolerated meds. Ambulated in hallway with PT. Patient ambulates to bathroom with rolling walker. Staff will continue to monitor and meet needs.

## 2015-09-29 NOTE — Progress Notes (Signed)
Pt placed on bipap 15/5 with 2 lpm bled in

## 2015-09-29 NOTE — Progress Notes (Signed)
Occupational Therapy Session Note  Patient Details  Name: Dana Abbott MRN: [id number] Date of Birth: 03/26/1966  Today's Date: 09/29/2015 OT Individual Time: 1131-1200 OT Individual Time Calculation (min): 29 min    Short Term Goals: Week 1:  OT Short Term Goal 1 (Week 1): Pt will be able to tolerate standing for at least 5 minutes to enable her to do light house keeping.  OT Short Term Goal 2 (Week 1): Pt will be able to transfer into bathtub with min A using tub bench. OT Short Term Goal 3 (Week 1): Pt will be able to don socks or shoes without A using AE if needed.  Skilled Therapeutic Interventions/Progress Updates:    Upon entering the room, pt seated in chair with no c/o pain this session. OT educated and demonstrated use of green, level 3 theraband for B UE strengthening. Pt returning demonstrations with min verbal cues for proper technique. Pt performed 3 sets of 15 chest pulls, bicep curls, shoulder flexion, shoulder diagonals, and alternating punches. Pt needing frequent rest breaks secondary to fatigue. Pt remained in chair with call bell and all needed items within reach upon exiting the room.   Therapy Documentation Precautions:  Precautions Precautions: Fall Precaution Comments: limited by R knee pain Restrictions Weight Bearing Restrictions: No   Pain: Pain Assessment Pain Score: 0-No pain  See Function Navigator for Current Functional Status.   Therapy/Group: Individual Therapy  Lowella Grip 09/29/2015, 12:29 PM

## 2015-09-30 ENCOUNTER — Inpatient Hospital Stay (HOSPITAL_COMMUNITY): Payer: PRIVATE HEALTH INSURANCE | Admitting: Occupational Therapy

## 2015-09-30 ENCOUNTER — Inpatient Hospital Stay (HOSPITAL_COMMUNITY): Payer: PRIVATE HEALTH INSURANCE | Admitting: Physical Therapy

## 2015-09-30 DIAGNOSIS — I5033 Acute on chronic diastolic (congestive) heart failure: Secondary | ICD-10-CM

## 2015-09-30 DIAGNOSIS — R269 Unspecified abnormalities of gait and mobility: Secondary | ICD-10-CM | POA: Diagnosis not present

## 2015-09-30 DIAGNOSIS — R5381 Other malaise: Secondary | ICD-10-CM | POA: Diagnosis not present

## 2015-09-30 LAB — BASIC METABOLIC PANEL
ANION GAP: 6 (ref 5–15)
BUN: 9 mg/dL (ref 6–20)
CALCIUM: 8.7 mg/dL — AB (ref 8.9–10.3)
CO2: 36 mmol/L — ABNORMAL HIGH (ref 22–32)
Chloride: 96 mmol/L — ABNORMAL LOW (ref 101–111)
Creatinine, Ser: 0.55 mg/dL (ref 0.44–1.00)
GLUCOSE: 90 mg/dL (ref 65–99)
POTASSIUM: 3.9 mmol/L (ref 3.5–5.1)
SODIUM: 138 mmol/L (ref 135–145)

## 2015-09-30 MED ORDER — POTASSIUM CHLORIDE CRYS ER 20 MEQ PO TBCR
20.0000 meq | EXTENDED_RELEASE_TABLET | Freq: Every day | ORAL | Status: DC
  Administered 2015-09-30 – 2015-10-06 (×7): 20 meq via ORAL
  Filled 2015-09-30 (×7): qty 1

## 2015-09-30 NOTE — Progress Notes (Signed)
Occupational Therapy Session Notes  Patient Details  Name: Dana Abbott MRN: [id number] Date of Birth: [birth year]  Today's Date: 09/30/2015  Short Term Goals: Week 1:  OT Short Term Goal 1 (Week 1): Pt will be able to tolerate standing for at least 5 minutes to enable her to do light house keeping.  OT Short Term Goal 2 (Week 1): Pt will be able to transfer into bathtub with min A using tub bench. OT Short Term Goal 3 (Week 1): Pt will be able to don socks or shoes without A using AE if needed.  Skilled Therapeutic Interventions/Progress Updates:   Session #1 1017-1130 - 73 Minutes Individual therapy  Pt found seated in relciner with 10/10 complaints of pain in right knee and 8/10 complaints of pain in right wrist; monitored this during session and pt stated she recently had pain medication. Pt refused shower this morning as she stated she already cleaned up. Pt requesting that OT come before PT for shower and ADL; this therapist notified scheduling team of that. Pt engaged in BUE strengthening HEP using orange theraband for first round, then green theraband for second round; see below for exercises completed. For exercises, pt required verbal cueing for correct technique and sequencing. Therapist then performed PROM and AA/ROM to patient's right wrist due to pain (supination/pronation and flexion/extension). Pt then stood from recliner (needing increased time, ~5 minutes before she was able to stand with rollator). Pt ambulated to w/c near door, simulated distance like patient has to walk to/from BR at home. Therapist propelled pt to therapy gym and in therapy gym pt engaged core strengthening exercises using trampoline and 3lb weighted ball; throwing ball X25 in front, X25 to left, X25 to right, then rotations using 3lb ball in w/c X10 each side. Therapist then assisted pt back to room and pt transferred back to recliner. Left pt seated in recliner with all needs within reach.    Session  #2 [id number] - 45 Minutes Individual Therapy Patient found seated in w/c with no complaints of pain, "I've been sitting here since I came back from the bathroom and I feel ok". Pt engaged in BUE HEP using green theraband. Therapist modified exercises to increase patient's independence with them. Pt then stood with rollator and engaged in standing activity focusing on dynamic standing balance/tolerance/endurance. Pt stood for 2 minutes 30 seconds before needing to take seated rest break. Pt stood again for 2 minutes 50 seconds doing activity and UE isometric exercises. Pt needed to sit down each time due to being "too hot" and due to right knee fatigue/shakiness. At end of session, left pt seated in recliner with all needs within reach.   Pt on 2L/min supplemental 02 entire session via nasal cannula during both sessions.   Therapy Documentation Precautions:  Precautions Precautions: Fall Precaution Comments: limited by R knee pain Restrictions Weight Bearing Restrictions: No  Vital Signs: Therapy Vitals Temp: 98.1 F (36.7 C) Temp Source: Oral Pulse Rate: 73 Resp: 19 BP: 113/67 mmHg Patient Position (if appropriate): Sitting Oxygen Therapy SpO2: 97 % O2 Device: Nasal Cannula  Exercises: General Exercises - Upper Extremity Shoulder Horizontal ABduction: Strengthening;10 reps;Both;Seated;Theraband (x2) Shoulder Horizontal ADduction: Strengthening;Both;10 reps;Seated;Theraband (x2) Shoulder Exercises Shoulder Flexion: Strengthening;Both;10 reps;Seated;Theraband (X2) Shoulder ABduction: Strengthening;Both;10 reps;Seated;Theraband (x2) Elbow Flexion: Strengthening;Both;10 reps;Seated;Theraband (X2) Elbow Extension: Strengthening;10 reps;Seated;Theraband (x2)  See Function Navigator for Current Functional Status.   Therapy/Group: Individual Therapy  Edwin Cap , MS, OTR/L, CLT  09/30/2015, 10:45 AM

## 2015-09-30 NOTE — Progress Notes (Signed)
Physical Therapy Session Note  Patient Details  Name: Dana Abbott MRN: [id number] Date of Birth: 20-Mar-1966  Today's Date: 09/30/2015 PT Individual Time: 9485-4627 PT Individual Time Calculation (min): 76 min   Short Term Goals: Week 1:  PT Short Term Goal 1 (Week 1): Pt will complete bed mobility in regular bed with Min A. PT Short Term Goal 2 (Week 1): Pt will ambulate 20 ft with LRAD & min A.  Skilled Therapeutic Interventions/Progress Updates:    Patient received sitting in chair in room and agreeable to PT. Patient reports significant pain in R knee As listed below Patient noted to not be wearing nasal canula for supplemental O2. PT assessed SpO2 at 84 % on room air. O2 applied and increased to 94% on 2L O2 Patient also reports increased pain in the R  Distal radius. per MD request, PT wrapped R wrist/forearm for pain management and edema control  Gait training x 4 for 25 ft with supervision A from PT. Patient performed 2 bouts with RW and 2 bouts with bariatric rollator. Improved step length and cadence noted with rollator compared to RW, but patient required mod cues for safety to engage brakes with rollator Throughout treatment patient completed sit<>stand Transfers x12 with supervision A  LE therex: Seated LAQ x 10 BLE  Seated Knee flexion/extension on towel 2x10 bilaterally Seated Marches x 10  Standing mini squat x 10 with BUE support on RW.  PT provided patient education for the importance of weight bearing exercises to improve joint mobility with OA as well as cues for improved positioning and proper ROM to improve strenthening aspect of movement.   WC mobility for 75 ft with mod A from PT for improved forward progression due to decreased ability to reach bilateral wheel rims.   Throughout PT patient on 2L O2/min via nasal canula with desat to 86% after gait and WC mobility with 1 min rest to increase to >90%    Patie left in couch in room with call bell within reach.      Therapy Documentation Precautions:  Precautions Precautions: Fall Precaution Comments: limited by R knee pain Restrictions Weight Bearing Restrictions: No General:   Vital Signs:  Pain: Pain Assessment Pain Assessment: 0-10 Pain Score: 10-Worst pain ever Pain Type: Chronic pain Pain Location: Jaw Pain Orientation: Right Pain Descriptors / Indicators: Throbbing Pain Onset: Gradual Patients Stated Pain Goal: 3  See Function Navigator for Current Functional Status.   Therapy/Group: Individual Therapy  Golden Pop 09/30/2015, 10:09 AM

## 2015-09-30 NOTE — Plan of Care (Signed)
Problem: RH PAIN MANAGEMENT Goal: RH STG PAIN MANAGED AT OR BELOW PT'S PAIN GOAL Less than 3 out of 10  Outcome: Not Progressing Pt rates pain as 6

## 2015-09-30 NOTE — Progress Notes (Signed)
Social Work Patient ID: Dana Abbott, female   DOB: Mar 16, 1966, 50 y.o.   MRN: [id number] Met with pt to discuss team conference goals supervision level and target discharge date 6/15. She was hoping it would be sooner she really misses her grandson. Discussed can move up if she reaches her goals and medically is ok to go. She doe shave a rollator at home, [place]-PT to work with and make sure she is safe with. She does not want a wheelchair or home O2 if she can help it. She feels she can sit down and get her O2 sats up again. Will have team check her sats and see if by getting stronger she can Get to the level she will not need home O2. Will continue to work on her discharge plan.

## 2015-09-30 NOTE — Patient Care Conference (Signed)
Inpatient RehabilitationTeam Conference and Plan of Care Update Date: 09/29/2015   Time: 8:00 AM    Patient Name: Dana Abbott      Medical Record Number: [id number]  Date of Birth: 1966/01/12 Sex: Female         Room/Bed: 4W11C/4W11C-01 Payor Info: Payor: [employer] / Plan: [employer] / Product Type: *No Product type* /    Admitting Diagnosis: Rice Debility  Admit Date/Time:  09/27/2015  5:18 PM Admission Comments: No comment available   Primary Diagnosis:  Debility Principal Problem: Debility  Patient Active Problem List   Diagnosis Date Noted  . Vitamin B 12 deficiency 09/29/2015  . Debility 09/27/2015  . Morbid obesity due to excess calories (HCC)   . Exertional dyspnea   . Requires supplemental oxygen   . Primary osteoarthritis of right knee   . Arterial hypotension   . Abnormality of gait   . Weakness of both legs   . Pain   . Fluid retention   . Hypoalbuminemia due to protein-calorie malnutrition (HCC)   . Acute on chronic diastolic (congestive) heart failure (HCC)   . Coronary artery disease involving native coronary artery of native heart without angina pectoris   . Iron deficiency anemia   . S/P gastric bypass   . Vitamin D deficiency   . OSA treated with BiPAP   . Hypotension due to drugs   . Pernicious anemia 09/22/2015  . CAD (coronary artery disease), native coronary artery 09/22/2015  . History of DVT (deep vein thrombosis) 09/22/2015  . Respiratory failure, acute (HCC) 09/22/2015  . CHF (congestive heart failure) (HCC) 09/22/2015  . Dyspnea 07/31/2015  . Acute on chronic diastolic heart failure (HCC) 07/30/2015  . Hypoxia 07/30/2015  . Blood loss anemia 07/30/2015    Expected Discharge Date: Expected Discharge Date: 10/07/15  Team Members Present: Physician leading conference: Dr. Faith Rogue Social Worker Present: Dossie Der, LCSW Nurse Present: Chana Bode, RN PT Present: Grier Rocher, PT OT Present:  Roney Mans, OT SLP Present: Jackalyn Lombard, Berneice Gandy, SLP PPS Coordinator present : Edson Snowball, PT     Current Status/Progress Goal Weekly Team Focus  Medical   deconditioning related to multiple medical/ respiratory failure. still with some fluid overload, morbidly obese  volume balance  diuresis, edema control, maintain bp   Bowel/Bladder   continent of bowel & bladder, LBM 09/28/15  Continue to be continent  Monitor & encourage   Swallow/Nutrition/ Hydration             ADL's   S with basic ADLs, min A transfers  mod I basic ADLs, S transfers  activity tolerance, ADL retraining, standing balance, general exercises for overall strength, pt education   Mobility   3 ft with RW & Min A, min A for stand pivot & sit<>stand transfers, limited by R knee pain  supervision for transfers & ambulation with LRAD, supervision for standing balance  R knee pain mnanagment, pt education, BLE strengthening, endurance & gait training   Communication             Safety/Cognition/ Behavioral Observations            Pain   patient has Tylenol, tramadol sch prn pain/ c/o headache last night 10/10 score, tylenol given with relief, has only used tylenol so far, also has a muscle rub for bil knees  pain score <4  Continue to assess & treat as needed   Skin   no ulcers, dry skin  no  skin breakdown  Continue assess skin q shift      *See Care Plan and progress notes for long and short-term goals.  Barriers to Discharge: size, hypotension, activity tolerance    Possible Resolutions to Barriers:  dietary education, attempt to achie optimal volume status, strengthening, improve stamina     Discharge Planning/Teaching Needs:    Home with her daughter who works during the day, but son will come and stay with her while sister is working. She will have 24 hr supervision.     Team Discussion:  MD increased her lasix today due to increased fluid. Activity tolerance is better and energy  conversation is increasing. Her goals are supervision level, which family can provide. R-knee buckling in therapies which was pre-morbid. PT to address this and try a brace for support. Requiring O2 for activites, may need at discharge.  Revisions to Treatment Plan:  None   Continued Need for Acute Rehabilitation Level of Care: The patient requires daily medical management by a physician with specialized training in physical medicine and rehabilitation for the following conditions: Daily direction of a multidisciplinary physical rehabilitation program to ensure safe treatment while eliciting the highest outcome that is of practical value to the patient.: Yes Daily medical management of patient stability for increased activity during participation in an intensive rehabilitation regime.: Yes Daily analysis of laboratory values and/or radiology reports with any subsequent need for medication adjustment of medical intervention for : Pulmonary problems;Cardiac problems;Blood pressure problems;Nutritional problems  Nello Corro, Lemar Livings 09/30/2015, 2:17 PM

## 2015-09-30 NOTE — Progress Notes (Addendum)
[place] PHYSICAL MEDICINE & REHABILITATION     PROGRESS NOTE  Subjective/Complaints:  Up in chair putting on clothes. States that feet still feel swollen, wrists too.    ROS: Denies CP, SOB, N/V/D  Objective: Vital Signs: Blood pressure 113/67, pulse 73, temperature 98.1 F (36.7 C), temperature source Oral, resp. rate 19, weight 175.3 kg (386 lb 7.5 oz), last menstrual period 09/03/2015, SpO2 97 %. No results found.  Recent Labs  09/28/15 0518  WBC 4.3  HGB 9.7*  HCT 37.2  PLT 164    Recent Labs  09/28/15 0518 09/30/15 0510  NA 140 138  K 4.3 3.9  CL 98* 96*  GLUCOSE 105* 90  BUN 11 9  CREATININE 0.58 0.55  CALCIUM 8.9 8.7*   CBG (last 3)  No results for input(s): GLUCAP in the last 72 hours.  Wt Readings from Last 3 Encounters:  09/30/15 175.3 kg (386 lb 7.5 oz)  09/27/15 175.451 kg (386 lb 12.8 oz)  08/28/15 166.47 kg (367 lb)    Physical Exam:  BP 113/67 mmHg  Pulse 73  Temp(Src) 98.1 F (36.7 C) (Oral)  Resp 19  Wt 175.3 kg (386 lb 7.5 oz)  SpO2 97%  LMP 09/03/2015 (Approximate) Constitutional: She appears well-developed and well-nourished. NAD HENT: Normocephalic and atraumatic.  Eyes: Conjunctivae and EOM are normal.  Cardiovascular: Normal rate and regular rhythm. No murmur heard. Respiratory: Effort normal. No stridor. No respiratory distress. She has no wheezes.  GI: Soft. Bowel sounds are normal. She exhibits no distension. There is no tenderness.  Musculoskeletal: She exhibits edema and tenderness. Wrists swollen also Pedal edema bilaterally.1++   Neurological: She is alert and oriented.  Sensation intact to light touch Motor: B/l UE: 5/5 proximal to distal RLE: Hip flexion 3-/5, knee extension 4-/5, ankle dorsi/plantar flexion 4+/5 LLE: Hip flexion, knee extension 4+/5, ankle doris/plantar flexion 5/5  Skin: Skin is warm and dry. No rash noted. No erythema.  Psychiatric: She has a normal mood and affect. Her behavior is normal.  Judgment and thought content normal.    Assessment/Plan: 1. Functional deficits secondary to deconditioning which require 3+ hours per day of interdisciplinary therapy in a comprehensive inpatient rehab setting. Physiatrist is providing close team supervision and 24 hour management of active medical problems listed below. Physiatrist and rehab team continue to assess barriers to discharge/monitor patient progress toward functional and medical goals.  Function:  Bathing Bathing position   Position: Wheelchair/chair at sink  Bathing parts Body parts bathed by patient: Right arm, Left arm, Chest, Abdomen, Front perineal area    Bathing assist Assist Level: Set up      Upper Body Dressing/Undressing Upper body dressing   What is the patient wearing?: Pull over shirt/dress     Pull over shirt/dress - Perfomed by patient: Thread/unthread right sleeve, Thread/unthread left sleeve          Upper body assist Assist Level: Set up      Lower Body Dressing/Undressing Lower body dressing   What is the patient wearing?: Non-skid slipper socks           Non-skid slipper socks- Performed by helper: Don/doff right sock, Don/doff left sock                  Lower body assist Assist for lower body dressing: Supervision or verbal cues      Toileting Toileting   Toileting steps completed by patient: Adjust clothing prior to toileting, Performs perineal hygiene, Adjust clothing after  toileting Toileting steps completed by helper: Performs perineal hygiene    Toileting assist Assist level: Touching or steadying assistance (Pt.75%)   Transfers Chair/bed transfer   Chair/bed transfer method: Stand pivot Chair/bed transfer assist level: Supervision or verbal cues Chair/bed transfer assistive device: Walker, Designer, fashion/clothing     Max distance: 98ft Assist level: Supervision or verbal cues   Wheelchair Wheelchair activity did not occur: Safety/medical  concerns Type: Manual Max wheelchair distance: 21ft Assist Level: Moderate assistance (Pt 50 - 74%)  Cognition Comprehension Comprehension assist level: Follows complex conversation/direction with no assist, Understands basic 90% of the time/cues < 10% of the time  Expression Expression assist level: Expresses complex ideas: With no assist  Social Interaction Social Interaction assist level: Interacts appropriately with others - No medications needed.  Problem Solving Problem solving assist level: Solves complex problems: Recognizes & self-corrects  Memory Memory assist level: Complete Independence: No helper    Medical Problem List and Plan: 1. RLE weakness, abnormality of gait secondary to deconditioning.   -continue CIR therapies.  2. DVT Prophylaxis/Anticoagulation: Pharmaceutical: Lovenox 3. Pain Management: Mobic daily. Added sportscreme as well as ice for local measures.  4. Mood: LCSW to follow for evaluation and support.  5. Neuropsych: This patient is capable of making decisions on her own behalf. 6. Skin/Wound Care: routine pressure relief measures.  7. Fluids/Electrolytes/Nutrition: Added protein supplements between meals. Check lytes intermittently. Will continue Mobic for now with monitoring for recurrent fluid retention as well as renal status.   BMET holding steady today 8. Acute on chronic diastolic CHF with acute hypoxic respiratory failure:  Weight UP today (not accurate---60lb increase!).   -weight yesterday appears to be accurate (lower weight was not)  -had EF of 55% on recent echo Filed Weights   09/29/15 0613 09/29/15 1200 09/30/15 0500  Weight: 175.4 kg (386 lb 11 oz) 175.406 kg (386 lb 11.2 oz) 175.3 kg (386 lb 7.5 oz)     -continue lasix 40mg  bid    -follow up lytes on Saturday 9. Non-obstructive CAD: Low dose ASA daily. 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 Educate patient on importance of  compliance after discharge. 11. Vitamin D deficiency: On supplement bid with follow up recommended on 08/01. 12. OHS/OSA: BIPAP at nights and whenever asleep.  13. Hypotension: Due to diuresis. Monitor for symptoms as well as renal status for AKI.  14. Morbid Obesity: Heart healthy diet-- added low salt diet. Will have dietician follow up closer to discharge to see if patient willing to modify diet after discharge.  15. Anemia  Hb 9.7 on 6/6 (improving)  Will cont to monitor   LOS (Days) 3 A FACE TO FACE EVALUATION WAS PERFORMED  Bethan Adamek T 09/30/2015 8:46 AM

## 2015-09-30 NOTE — IPOC Note (Signed)
Overall Plan of Care [facility]) Patient Details Name: Dana Abbott MRN: [id number] DOB: 10-16-1965  Admitting Diagnosis: [facility] Problems: Principal Problem:   Debility Active Problems:   Abnormality of gait   Fluid retention   Hypoalbuminemia due to protein-calorie malnutrition (HCC)   Acute on chronic diastolic (congestive) heart failure (HCC)   Coronary artery disease involving native coronary artery of native heart without angina pectoris   Iron deficiency anemia   S/P gastric bypass   Vitamin D deficiency   OSA treated with BiPAP   Vitamin B 12 deficiency     Functional Problem List: Nursing Bowel, Edema, Endurance, Medication Management, Nutrition, Safety, Motor, Pain  PT Balance, Endurance, Motor, Pain, Safety, Sensory  OT Balance, Endurance, Motor, Pain  SLP    TR         Basic ADL's: OT Bathing, Dressing, Toileting     Advanced  ADL's: OT Simple Meal Preparation, Light Housekeeping     Transfers: PT Bed to Chair, Car, Bed Mobility, Occupational psychologist, Research scientist (life sciences): PT Ambulation, Psychologist, prison and probation services, Stairs     Additional Impairments: OT None  SLP        TR      Anticipated Outcomes Item Anticipated Outcome  Self Feeding I  Swallowing      Basic self-care  mod I  Toileting  mod I   Bathroom Transfers S  Bowel/Bladder  manage bowel with mod I assist , manage bladder with level 5 BSc assist  Transfers  supervision  Locomotion  supervision  Communication     Cognition     Pain  Manage pain at or below level 10 with scvheduled and prn pain meds  Safety/Judgment  Maintain safety with cues/supervison   Therapy Plan: PT Intensity: Minimum of 1-2 x/day ,45 to 90 minutes PT Frequency: 5 out of 7 days PT Duration Estimated Length of Stay: 10-14 days OT Intensity: Minimum of 1-2 x/day, 45 to 90 minutes OT Frequency: 5 out of 7 days OT Duration/Estimated Length of Stay: 10-14 days         Team  Interventions: Nursing Interventions Patient/Family Education, Bowel Management, Disease Management/Prevention, Discharge Planning, Pain Management, Medication Management  PT interventions Ambulation/gait training, Discharge planning, Functional mobility training, Psychosocial support, Therapeutic Activities, Wheelchair propulsion/positioning, Therapeutic Exercise, Skin care/wound management, Neuromuscular re-education, Disease management/prevention, Warden/ranger, DME/adaptive equipment instruction, Pain management, UE/LE Strength taining/ROM, Stair training, Patient/family education, Community reintegration  OT Interventions Warden/ranger, Discharge planning, Pain management, Functional mobility training, Patient/family education, DME/adaptive equipment instruction, Self Care/advanced ADL retraining, Therapeutic Activities, Therapeutic Exercise, UE/LE Strength taining/ROM  SLP Interventions    TR Interventions    SW/CM Interventions Discharge Planning, Psychosocial Support, Patient/Family Education    Team Discharge Planning: Destination: PT-Home ,OT- Home , SLP-  Projected Follow-up: PT-Home health PT, OT-  Home health OT, SLP-  Projected Equipment Needs: PT- (bariatric RW, potential need for w/c for community mobility), OT- Tub/shower bench, SLP-  Equipment Details: PT- , OT-  Patient/family involved in discharge planning: PT- Patient,  OT-Patient, SLP-   MD ELOS: 13-17d Medical Rehab Prognosis:  Good Assessment: 50 y.o. female with history of severe obesity s/p gastric bypass, exertional dyspnea, CHF, non-obstructive CAD s/p MI 2003, recent admission 07/2015 for CP, SOB due to ABLA and acute exacerbation of CHF. She was readmitted on 09/22/15 with progressive SOB, CP and BLE edema. BLE negative for DVT and Chest CT negative for PE. Blood cultures and UCS negative for infection. She  was treated with IV diuresis for management of acute respiratory failure due to CHF  exacerbation and in setting of OHS. She was placed on B12 injections due to recent diagnosis of pernicious anemia. 2 D echo showed EF 55% with no wall abnormality, mild LVH, moderately dilated RA and elevated pulmonary pressures. Has declined nutritional education regarding weight loss and has been educated on importance on vitamin supplementation.   Now requiring 24/7 Rehab RN,MD, as well as CIR level PT, OT   Treatment team will focus on ADLs and mobility with goals set at Mod I/Supervision  See Team Conference Notes for weekly updates to the plan of care

## 2015-10-01 ENCOUNTER — Inpatient Hospital Stay (HOSPITAL_COMMUNITY): Payer: PRIVATE HEALTH INSURANCE | Admitting: Occupational Therapy

## 2015-10-01 ENCOUNTER — Inpatient Hospital Stay (HOSPITAL_COMMUNITY): Payer: PRIVATE HEALTH INSURANCE | Admitting: Physical Therapy

## 2015-10-01 DIAGNOSIS — R269 Unspecified abnormalities of gait and mobility: Secondary | ICD-10-CM | POA: Diagnosis not present

## 2015-10-01 DIAGNOSIS — R5381 Other malaise: Secondary | ICD-10-CM | POA: Diagnosis not present

## 2015-10-01 DIAGNOSIS — I2510 Atherosclerotic heart disease of native coronary artery without angina pectoris: Secondary | ICD-10-CM | POA: Diagnosis not present

## 2015-10-01 DIAGNOSIS — I5033 Acute on chronic diastolic (congestive) heart failure: Secondary | ICD-10-CM | POA: Diagnosis not present

## 2015-10-01 NOTE — Progress Notes (Signed)
[place] PHYSICAL MEDICINE & REHABILITATION     PROGRESS NOTE  Subjective/Complaints:  Sitting eob. Feels that swelling is better.    ROS: Denies CP, SOB, N/V/D  Objective: Vital Signs: Blood pressure 107/61, pulse 81, temperature 98.6 F (37 C), temperature source Oral, resp. rate 22, weight 175.3 kg (386 lb 7.5 oz), last menstrual period 09/03/2015, SpO2 99 %. No results found. No results for input(s): WBC, HGB, HCT, PLT in the last 72 hours.  Recent Labs  09/30/15 0510  NA 138  K 3.9  CL 96*  GLUCOSE 90  BUN 9  CREATININE 0.55  CALCIUM 8.7*   CBG (last 3)  No results for input(s): GLUCAP in the last 72 hours.  Wt Readings from Last 3 Encounters:  10/01/15 175.3 kg (386 lb 7.5 oz)  09/27/15 175.451 kg (386 lb 12.8 oz)  08/28/15 166.47 kg (367 lb)    Physical Exam:  BP 107/61 mmHg  Pulse 81  Temp(Src) 98.6 F (37 C) (Oral)  Resp 22  Wt 175.3 kg (386 lb 7.5 oz)  SpO2 99%  LMP 09/03/2015 (Approximate) Constitutional: She appears well-developed and well-nourished. NAD HENT: Normocephalic and atraumatic.  Eyes: Conjunctivae and EOM are normal.  Cardiovascular: Normal rate and regular rhythm. No murmur heard. Respiratory: Effort normal. No stridor. No respiratory distress. She has no wheezes.  GI: Soft. Bowel sounds are normal. She exhibits no distension. There is no tenderness.  Musculoskeletal: She exhibits edema and tenderness. Wrists swollen also Pedal edema bilaterally.1++   Neurological: She is alert and oriented.  Sensation intact to light touch Motor: B/l UE: 5/5 proximal to distal RLE: Hip flexion 3-/5, knee extension 4-/5, ankle dorsi/plantar flexion 4+/5 LLE: Hip flexion, knee extension 4+/5, ankle doris/plantar flexion 5/5  Skin: Skin is warm and dry. No rash noted. No erythema.  Psychiatric: She has a normal mood and affect. Her behavior is normal. Judgment and thought content normal.    Assessment/Plan: 1. Functional deficits secondary  to deconditioning which require 3+ hours per day of interdisciplinary therapy in a comprehensive inpatient rehab setting. Physiatrist is providing close team supervision and 24 hour management of active medical problems listed below. Physiatrist and rehab team continue to assess barriers to discharge/monitor patient progress toward functional and medical goals.  Function:  Bathing Bathing position   Position: Wheelchair/chair at sink  Bathing parts Body parts bathed by patient: Right arm, Left arm, Chest, Abdomen, Front perineal area    Bathing assist Assist Level: Set up, Touching or steadying assistance(Pt > 75%) (steady assist when standing to wash peri area, buttocks)      Upper Body Dressing/Undressing Upper body dressing   What is the patient wearing?: Pull over shirt/dress     Pull over shirt/dress - Perfomed by patient: Thread/unthread right sleeve, Thread/unthread left sleeve, Put head through opening, Pull shirt over trunk          Upper body assist Assist Level: Set up      Lower Body Dressing/Undressing Lower body dressing   What is the patient wearing?: Non-skid slipper socks           Non-skid slipper socks- Performed by helper: Don/doff right sock, Don/doff left sock                  Lower body assist Assist for lower body dressing: Supervision or verbal cues      Toileting Toileting   Toileting steps completed by patient: Adjust clothing prior to toileting, Performs perineal hygiene, Adjust clothing after  toileting Toileting steps completed by helper: Adjust clothing after toileting    Toileting assist Assist level: Touching or steadying assistance (Pt.75%)   Transfers Chair/bed transfer   Chair/bed transfer method: Ambulatory Chair/bed transfer assist level: Supervision or verbal cues Chair/bed transfer assistive device: Patent attorney     Max distance: 69ft Assist level: Supervision or Scientist, research (physical sciences) activity did not occur: Safety/medical concerns Type: Manual Max wheelchair distance: 68ft  Assist Level: Moderate assistance (Pt 50 - 74%)  Cognition Comprehension Comprehension assist level: Follows complex conversation/direction with no assist  Expression Expression assist level: Expresses complex ideas: With no assist  Social Interaction Social Interaction assist level: Interacts appropriately with others - No medications needed.  Problem Solving Problem solving assist level: Solves complex problems: Recognizes & self-corrects  Memory Memory assist level: Complete Independence: No helper    Medical Problem List and Plan: 1. RLE weakness, abnormality of gait secondary to deconditioning.   -continue CIR therapies.  2. DVT Prophylaxis/Anticoagulation: Pharmaceutical: Lovenox 3. Pain Management: Mobic daily. Added sportscreme as well as ice for local measures.  4. Mood: LCSW to follow for evaluation and support.  5. Neuropsych: This patient is capable of making decisions on her own behalf. 6. Skin/Wound Care: routine pressure relief measures.  7. Fluids/Electrolytes/Nutrition: Added protein supplements between meals. Check lytes intermittently. Will continue Mobic for now with monitoring for recurrent fluid retention as well as renal status.   BMET holding steady today 8. Acute on chronic diastolic CHF with acute hypoxic respiratory failure:    -weights appear to be accurate (lower weight was not)  -had EF of 55% on recent echo Filed Weights   09/30/15 0500 09/30/15 1400 10/01/15 0616  Weight: 175.3 kg (386 lb 7.5 oz) 175.179 kg (386 lb 3.2 oz) 175.3 kg (386 lb 7.5 oz)     -continue lasix  bid for now.  -follow up lytes on Saturday 9. Non-obstructive CAD: Low dose ASA daily. 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 Educate patient on importance of compliance after discharge. 11. Vitamin D deficiency: On  supplement bid with follow up recommended on 08/01. 12. OHS/OSA: BIPAP at nights and whenever asleep.  13. Hypotension: Due to diuresis. Monitor for symptoms as well as renal status for AKI.  14. Morbid Obesity: Heart healthy diet-- added low salt diet. Will have dietician follow up closer to discharge to see if patient willing to modify diet after discharge.  15. Anemia  Hb 9.7 on 6/6 (improving)  Will cont to monitor   LOS (Days) 4 A FACE TO FACE EVALUATION WAS PERFORMED  Dana Abbott T 10/01/2015 9:15 AM

## 2015-10-01 NOTE — Progress Notes (Signed)
Occupational Therapy Session Note  Patient Details  Name: Dana Abbott MRN: [id number] Date of Birth: 1965/05/03  Today's Date: 10/01/2015 OT Individual Time: 1300-1413 OT Individual Time Calculation (min): 73 min    Skilled Therapeutic Interventions/Progress Updates:    Pt completed toilet transfer and toileting to start session.  Pt was already sitting on the toilet when therapist entered.  She was able to complete toilet hygiene and transfer with supervision.  Once she transitioned to the wheelchair from the toilet, she asked therapist to wet a washcloth for her in order to wash her hands.  Noted pt in bathroom without O2 supplementation.  Sats in the upper 70s when she transferred to the wheelchair.  She needed 2Ls supplementation in order to increase sats back to 91%.  Therapist pushed her wheelchair down to the tub/shower room for practice with tub bench for shower/tub transfers.  Pt reports using one similar to ours when she is at her mother's house.  Min guard assist to complete transfer in shower with use of the rollator.  Pt slid off of the front of the chair when transferring out of the shower to sitting on the side of the tub in controlled manner.  She stated that at her mom's house there is a grab bar on the back of the tub she uses when sliding her legs around.  We didn't have this so she just slid around and down on the tub edge.   She was able to stand from edge of tub with supervision using the rollator.  Discussed need for heavy duty tub bench as her son found some on-line and plans to purchase.  Transitioned to the kitchen for work on functional mobility to transfers items from the counter to the cabinets and the refrigerator.  Finished session with return to room and transfer to larger bedside chair.  Pt completed UE exercises for shoulder flexion bilaterally and elbow flexion.  Orange theraband used for shoulder with green for elbow flexion, with one set of 10 repetitions for each  exercise.     Therapy Documentation Precautions:  Precautions Precautions: Fall Precaution Comments: limited by R knee pain Restrictions Weight Bearing Restrictions: No  Pain: Pain Assessment Pain Assessment: Faces Faces Pain Scale: No hurt Pain Type: Acute pain Pain Location: Knee Pain Orientation: Right Pain Intervention(s): Repositioned ADL: See Function Navigator for Current Functional Status.   Therapy/Group: Individual Therapy  Bianey Tesoro OTR/L 10/01/2015, 3:42 PM

## 2015-10-01 NOTE — Progress Notes (Signed)
Occupational Therapy Session Note  Patient Details  Name: Dana Abbott MRN: [id number] Date of Birth: [birth year]  Today's Date: 10/01/2015 OT Individual Time: 0905-1002 OT Individual Time Calculation (min): 57 min   Short Term Goals: Week 1:  OT Short Term Goal 1 (Week 1): Pt will be able to tolerate standing for at least 5 minutes to enable her to do light house keeping.  OT Short Term Goal 2 (Week 1): Pt will be able to transfer into bathtub with min A using tub bench. OT Short Term Goal 3 (Week 1): Pt will be able to don socks or shoes without A using AE if needed.  Skilled Therapeutic Interventions/Progress Updates:  Patient found seated EOB. Pt with complaints of tightness in right knee and left knee, but no pain. Pt with urgency to use BR, therefore therapist brought BSC and set-up beside bed. Pt with incontinence upon standing. Pt performed toilet transfer at set up level, pt not wanting therapist there during toileting needs. After toileting, pt stood from [facility] and ambulated into BR. Pt transferred onto shower seat in walk in shower for UB/LB bathing and dressing. Pt very pleased and happy to shower, pt eager to shower everyday now. Pt on RA entire session, per her request and sats dropped as low as 81%. Once 2L/min supplemental 02 via [place] donned and rest break taken, sats quickly increased to 96% (within seconds). During session, pt took increased time and with increased frustration when having incontinence on floor. At end of session, left pt seated in w/c with PT present.   Therapy Documentation Precautions:  Precautions Precautions: Fall Precaution Comments: limited by R knee pain Restrictions Weight Bearing Restrictions: No  Vital Signs: Therapy Vitals Temp: 98.6 F (37 C) Temp Source: Oral Pulse Rate: 81 Resp: (!) 22 BP: 107/61 mmHg Patient Position (if appropriate): Lying Oxygen Therapy SpO2: 99 % O2 Device: CPAP O2 Flow Rate (L/min): 2 L/min  See Function  Navigator for Current Functional Status.  Therapy/Group: Individual Therapy  Edwin Cap , MS, OTR/L, CLT  10/01/2015, 10:07 AM

## 2015-10-01 NOTE — Procedures (Signed)
Placed patient on BIPAP for the night.  Patient is tolerating well at this time. 

## 2015-10-01 NOTE — Progress Notes (Signed)
Physical Therapy Session Note  Patient Details  Name: Dana Abbott MRN: [id number] Date of Birth: 24-Jul-1965  Today's Date: 10/01/2015 PT Individual Time:1002-1100 Calculated individual treatment time: 58 min       Short Term Goals: Week 1:  PT Short Term Goal 1 (Week 1): Pt will complete bed mobility in regular bed with Min A. PT Short Term Goal 2 (Week 1): Pt will ambulate 20 ft with LRAD & min A.  Skilled Therapeutic Interventions/Progress Updates:    Patient received with trade off from OT sitting in [facility]  Patient performed WC training with min A from PT for 75 ft with mod cues for improved use of BUE and proper positioning in chair to create improved mechanical advantage.   Transfer training to various surface heights including nustep, mat table, 21 inch bariatric chair with supervision A and baritric rollator with min cues for improved safet with brake management on rollator.   Gait training for 59f x 2 with rollator with supervision A from PT. Patient was maintain on 2L supplemental O2 via nasal canula during gait training with desat to 83% following second bout. 2 minutes of pursed lip breathing while on supplemental O2 to increase to >90%.   Patient performed nustep training on level 2 for a total of 10 minutes with 3, 1 minute breaks due to increased knee pain.   Patient returned to room and left sitting on couch with call bell within reach and all other needs met.   Therapy Documentation Precautions:  Precautions Precautions: Fall Precaution Comments: limited by R knee pain Restrictions Weight Bearing Restrictions: No General:   Vital Signs: Therapy Vitals Temp: 98.6 F (37 C) Temp Source: Oral Pulse Rate: 81 Resp: (!) 22 BP: 107/61 mmHg Patient Position (if appropriate): Lying Oxygen Therapy SpO2: 99 % O2 Device: CPAP O2 Flow Rate (L/min): 2 L/min  See Function Navigator for Current Functional Status.   Therapy/Group: Individual Therapy  [name]6/12/2015, 7:39 AM

## 2015-10-02 ENCOUNTER — Inpatient Hospital Stay (HOSPITAL_COMMUNITY): Payer: PRIVATE HEALTH INSURANCE | Admitting: Physical Therapy

## 2015-10-02 DIAGNOSIS — R269 Unspecified abnormalities of gait and mobility: Secondary | ICD-10-CM | POA: Diagnosis not present

## 2015-10-02 DIAGNOSIS — R5381 Other malaise: Secondary | ICD-10-CM | POA: Diagnosis not present

## 2015-10-02 DIAGNOSIS — I2510 Atherosclerotic heart disease of native coronary artery without angina pectoris: Secondary | ICD-10-CM | POA: Diagnosis not present

## 2015-10-02 DIAGNOSIS — I5033 Acute on chronic diastolic (congestive) heart failure: Secondary | ICD-10-CM | POA: Diagnosis not present

## 2015-10-02 LAB — BASIC METABOLIC PANEL
Anion gap: 8 (ref 5–15)
BUN: 9 mg/dL (ref 6–20)
CALCIUM: 8.9 mg/dL (ref 8.9–10.3)
CO2: 35 mmol/L — ABNORMAL HIGH (ref 22–32)
Chloride: 97 mmol/L — ABNORMAL LOW (ref 101–111)
Creatinine, Ser: 0.55 mg/dL (ref 0.44–1.00)
GFR calc Af Amer: >60 mL/min (ref >60)
GFR calc non Af Amer: >60 mL/min (ref >60)
GLUCOSE: 92 mg/dL (ref 65–99)
Potassium: 4.1 mmol/L (ref 3.5–5.1)
Sodium: 140 mmol/L (ref 135–145)

## 2015-10-02 NOTE — Progress Notes (Signed)
Physical Therapy Session Note  Patient Details  Name: Dana Abbott MRN: [id number] Date of Birth: [birth year]  Today's Date: 10/02/2015 PT Individual Time: 5809-9833 PT Individual Time Calculation (min): 43 min   Short Term Goals: Week 1:  PT Short Term Goal 1 (Week 1): Pt will complete bed mobility in regular bed with Min A. PT Short Term Goal 2 (Week 1): Pt will ambulate 20 ft with LRAD & min A.  Skilled Therapeutic Interventions/Progress Updates:    Patient received sitting couch in room without Supplemental O2. SpO@ measured at 87%. Increased to>90% with application of O2 via nasal canula.  PT educated patient on the importance of Oxygen use to prevent Hypoxia and consequences of continued decreased oxygen saturation. Patient imformed PT that she has been getting up and walking to bathroom with assistance and without supplemental O2 up to 3 times throughout the night. RN made aware.   Patient performed gait training for 72ft using rollator without Supplelmental O2 and desat to 75%. O2 given at L2 /min via nasal canula and increased to >90% in 1.5 minutes. Patient re-educated on the importance of keeping O2 on with all ambulation in room.  Gait training performed for 45 ft with rollator second time with O2 and dest to 84% with 1 minute to increase to >90% with pursed lip breathing.   Patient performed sit<>stand transfer throughout treatment x 10 with supervision A from PT with rollator.   WC mobility with BUE use with min A from PT as well as cues for improved use of LUE to maintain straight trajectory.  Patient returned to room and performed stand pivot transfer to couch with supervision A from PT. Patient left sitting in room with call bell within reach.     Therapy Documentation Precautions:  Precautions Precautions: Fall Precaution Comments: limited by R knee pain Restrictions Weight Bearing Restrictions: No General:   Vital Signs:  Pain: Pain Assessment Pain  Assessment: No/denies pain  See Function Navigator for Current Functional Status.   Therapy/Group: Individual Therapy  Golden Pop 10/02/2015, 12:59 PM

## 2015-10-02 NOTE — Progress Notes (Signed)
RT NOTE:  Pt not available when RT arrived. RN stated she would call when patient was ready to go on CPAP

## 2015-10-02 NOTE — Progress Notes (Signed)
RT NOTE:  Pt placed on AUTO BIPAP 15/5 for sleep. 2L O2 bled in, humidity chamber filled.

## 2015-10-02 NOTE — Progress Notes (Signed)
[place] PHYSICAL MEDICINE & REHABILITATION     PROGRESS NOTE  Subjective/Complaints:  Slept well. No new complaints this morning. Tolerating BIPAP.    ROS: Denies CP, SOB, N/V/D  Objective: Vital Signs: Blood pressure 110/63, pulse 92, temperature 98.8 F (37.1 C), temperature source Axillary, resp. rate 18, weight 171.5 kg (378 lb 1.4 oz), last menstrual period 09/03/2015, SpO2 96 %. No results found. No results for input(s): WBC, HGB, HCT, PLT in the last 72 hours.  Recent Labs  09/30/15 0510 10/02/15 0445  NA 138 140  K 3.9 4.1  CL 96* 97*  GLUCOSE 90 92  BUN 9 9  CREATININE 0.55 0.55  CALCIUM 8.7* 8.9   CBG (last 3)  No results for input(s): GLUCAP in the last 72 hours.  Wt Readings from Last 3 Encounters:  10/02/15 171.5 kg (378 lb 1.4 oz)  09/27/15 175.451 kg (386 lb 12.8 oz)  08/28/15 166.47 kg (367 lb)    Physical Exam:  BP 110/63 mmHg  Pulse 92  Temp(Src) 98.8 F (37.1 C) (Axillary)  Resp 18  Wt 171.5 kg (378 lb 1.4 oz)  SpO2 96%  LMP 09/03/2015 (Approximate) Constitutional: She appears well-developed and well-nourished. NAD HENT: Normocephalic and atraumatic.  Eyes: Conjunctivae and EOM are normal.  Cardiovascular: Normal rate and regular rhythm. No murmur heard. Respiratory: Effort normal. No stridor. No respiratory distress. She has no wheezes.  GI: Soft. Bowel sounds are normal. She exhibits no distension. There is no tenderness.  Musculoskeletal: She exhibits edema and tenderness. Wrists swollen also Pedal edema bilaterally.1++   Neurological: She is alert and oriented.  Sensation intact to light touch Motor: B/l UE: 5/5 proximal to distal RLE: Hip flexion 3-/5, knee extension 4-/5, ankle dorsi/plantar flexion 4+/5 LLE: Hip flexion, knee extension 4+/5, ankle doris/plantar flexion 5/5  Skin: Skin is warm and dry. No rash noted. No erythema.  Psychiatric: She has a normal mood and affect. Her behavior is normal. Judgment and thought  content normal.    Assessment/Plan: 1. Functional deficits secondary to deconditioning which require 3+ hours per day of interdisciplinary therapy in a comprehensive inpatient rehab setting. Physiatrist is providing close team supervision and 24 hour management of active medical problems listed below. Physiatrist and rehab team continue to assess barriers to discharge/monitor patient progress toward functional and medical goals.  Function:  Bathing Bathing position   Position: Shower  Bathing parts Body parts bathed by patient: Right arm, Left arm, Chest, Abdomen, Front perineal area, Buttocks, Right upper leg, Left upper leg, Right lower leg, Left lower leg    Bathing assist Assist Level: Set up   Set up : To obtain items  Upper Body Dressing/Undressing Upper body dressing   What is the patient wearing?: Pull over shirt/dress     Pull over shirt/dress - Perfomed by patient: Thread/unthread right sleeve, Thread/unthread left sleeve, Put head through opening, Pull shirt over trunk          Upper body assist Assist Level: Set up   Set up : To obtain clothing/put away  Lower Body Dressing/Undressing Lower body dressing   What is the patient wearing?: Non-skid slipper socks (skirt)         Non-skid slipper socks- Performed by patient: Don/doff right sock, Don/doff left sock Non-skid slipper socks- Performed by helper: Don/doff right sock, Don/doff left sock                  Lower body assist Assist for lower body dressing: Supervision or  verbal cues      Toileting Toileting   Toileting steps completed by patient: Performs perineal hygiene, Adjust clothing prior to toileting Toileting steps completed by helper: Adjust clothing after toileting    Toileting assist Assist level: Supervision or verbal cues   Transfers Chair/bed transfer   Chair/bed transfer method: Stand pivot Chair/bed transfer assist level: Supervision or verbal cues Chair/bed transfer assistive  device: Patent attorney     Max distance: 35 ft Assist level: Supervision or verbal cues   Wheelchair Wheelchair activity did not occur: Safety/medical concerns Type: Manual Max wheelchair distance: 75 ft  Assist Level: Touching or steadying assistance (Pt > 75%)  Cognition Comprehension Comprehension assist level: Follows complex conversation/direction with no assist  Expression Expression assist level: Expresses complex ideas: With no assist  Social Interaction Social Interaction assist level: Interacts appropriately with others - No medications needed.  Problem Solving Problem solving assist level: Solves complex problems: Recognizes & self-corrects  Memory Memory assist level: Complete Independence: No helper    Medical Problem List and Plan: 1. RLE weakness, abnormality of gait secondary to deconditioning.   -continue CIR therapies.  2. DVT Prophylaxis/Anticoagulation: Pharmaceutical: Lovenox 3. Pain Management: Mobic daily. Added sportscreme as well as ice for local measures.  4. Mood: LCSW to follow for evaluation and support.  5. Neuropsych: This patient is capable of making decisions on her own behalf. 6. Skin/Wound Care: routine pressure relief measures.  7. Fluids/Electrolytes/Nutrition: I personally reviewed the patient's labs today.   BMET holding steady  8. Acute on chronic diastolic CHF with acute hypoxic respiratory failure:    -weights appear to be accurate (lower weight was not)  -had EF of 55% on recent echo [facility] Weights   09/30/15 1400 10/01/15 0616 10/02/15 0556  Weight: 175.179 kg (386 lb 3.2 oz) 175.3 kg (386 lb 7.5 oz) 171.5 kg (378 lb 1.4 oz)     -continue lasix  bid for now.   9. Non-obstructive CAD: Low dose ASA daily. 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 Educate patient on importance of compliance after discharge. 11. Vitamin D deficiency: On supplement bid  with follow up recommended on 08/01. 12. OHS/OSA: BIPAP at nights and whenever asleep.  13. Hypotension: Due to diuresis. Monitor for symptoms as well as renal status for AKI.  14. Morbid Obesity: Heart healthy diet-- added low salt diet. Will have dietician follow up closer to discharge to see if patient willing to modify diet after discharge.  15. Anemia  Hb 9.7 on 6/6 (improving)----recheck monday  Will cont to monitor   LOS (Days) 5 A FACE TO FACE EVALUATION WAS PERFORMED  Ursala Cressy T 10/02/2015 9:27 AM

## 2015-10-03 ENCOUNTER — Inpatient Hospital Stay (HOSPITAL_COMMUNITY): Payer: PRIVATE HEALTH INSURANCE | Admitting: *Deleted

## 2015-10-03 ENCOUNTER — Inpatient Hospital Stay (HOSPITAL_COMMUNITY): Payer: PRIVATE HEALTH INSURANCE | Admitting: Physical Therapy

## 2015-10-03 DIAGNOSIS — I2510 Atherosclerotic heart disease of native coronary artery without angina pectoris: Secondary | ICD-10-CM | POA: Diagnosis not present

## 2015-10-03 DIAGNOSIS — I5033 Acute on chronic diastolic (congestive) heart failure: Secondary | ICD-10-CM | POA: Diagnosis not present

## 2015-10-03 DIAGNOSIS — R5381 Other malaise: Secondary | ICD-10-CM | POA: Diagnosis not present

## 2015-10-03 DIAGNOSIS — R269 Unspecified abnormalities of gait and mobility: Secondary | ICD-10-CM | POA: Diagnosis not present

## 2015-10-03 LAB — CBC
HEMATOCRIT: 34.5 % — AB (ref 36.0–46.0)
HEMOGLOBIN: 9.2 g/dL — AB (ref 12.0–15.0)
MCH: 22.9 pg — ABNORMAL LOW (ref 26.0–34.0)
MCHC: 26.7 g/dL — AB (ref 30.0–36.0)
MCV: 86 fL (ref 78.0–100.0)
Platelets: 157 10*3/uL (ref 150–400)
RBC: 4.01 MIL/uL (ref 3.87–5.11)
RDW: 24.3 % — ABNORMAL HIGH (ref 11.5–15.5)
WBC: 3.5 10*3/uL — AB (ref 4.0–10.5)

## 2015-10-03 NOTE — Progress Notes (Signed)
Physical Therapy Session Note  Patient Details  Name: Dana Abbott MRN: [id number] Date of Birth: June 08, 1965  Today's Date: 10/03/2015 PT Individual Time: 0900-1000 PT Individual Time Calculation (min): 60 min     Skilled Therapeutic Interventions/Progress Updates:  Patient sitting EOb at the beginning of session, reduced motivation to participate, states she wishes she was home, misses her family. O2 saturation in sitting and with supplemental O2 at 99 %. With cues agrees to participate, sit to stand with Supervision, and short distance ambulation in room with RW to sink in order to perform seated bathing. Patient requests to be left alone during washing, indirect supervision provided,through curtain and occasional check. Room ambulation to bathroom with supervision.  NuStep 1 x 5 min with rest breaks due to pain in R Knee.  Initiated gait training but terminated due to increasing pain in R Knee- reported to nursing.  Patient returned to room , assisted in transfer to recliner ,left with all needs within reach.  Patient motivation in really low, and she is not willing to change her ways, and accept education in order to improve function, through session states multiple times she wants to leave and go home.    Therapy Documentation Precautions:  Precautions Precautions: Fall Precaution Comments: limited by R knee pain Restrictions Weight Bearing Restrictions: No   See Function Navigator for Current Functional Status.   Therapy/Group: Individual Therapy  Dorna Mai 10/03/2015, 12:11 PM

## 2015-10-03 NOTE — Progress Notes (Signed)
Physical Therapy Session Note  Patient Details  Name: Dana Abbott MRN: [id number] Date of Birth: 1965-11-08  Today's Date: 10/03/2015 PT Individual Time: 0802-0901 and 7867-6720 PT Individual Time Calculation (min): 59 min and 43 min PT missed time: 32 minutes (Pt refusing to participate)  Short Term Goals: Week 1:  PT Short Term Goal 1 (Week 1): Pt will complete bed mobility in regular bed with Min A. PT Short Term Goal 2 (Week 1): Pt will ambulate 20 ft with LRAD & min A.  Skilled Therapeutic Interventions/Progress Updates:    Treatment 1: Pt received in bed; session mainly focused on pt education. Pt noted 3/10 "normal morning pain" in R knee & denied pain medication. During session pt's SpO2 assessed after transferring long sit>sitting EOB & found to be 86% but increased >90% on room air after rest break; SpO2 would then drop to 86-88% while sitting EOB. Pt states she is willing to wear nasal cannula for supplemental oxygen but then very resistant to do so during session. Educated pt on need for SpO2 to remain equal to or greater than 90% during all activities to ensure adequate oxygen supply to brain & body; discussed consequences of too low oxygen levels. Pt strongly wishes to go home today. Educated pt on benefits of therapy & continued participation. Discussed with MD pt's wish to go home as soon as possible. This therapist provided therapeutic listening & education throughout session with pt becoming tearful at times. At end of session pt left sitting on EOB with all needs within reach & on 2L/min supplemental oxygen. Discussed ability to go outside during afternoon session on this date & pt happy about this.  Treatment 2: Pt received in recliner & agreeable to PT, denying c/o pain. Pt requested a larger w/c & PT attempted to find larger chair on unit, then an available chair on another unit but was unsuccessful. Pt then reported she only wanted a different chair because it appeared that  therapists have a hard time pushing her in current w/c. Educated pt not to be concerned about this & this therapist informed pt that main goal is to ensure pt's comfort in chair. Pt agreeable to continue using current chair. Discussed pt's daily routine & pt reported she drives her daughter to work. This therapist educated pt that she should be cleared by MD before returning to driving; pt very frustrated with this. Pt completed sit>stand transfer with supervision from recliner; gait training x 20 ft with rollator & supervision. Pt asked PT "how far are we going" and PT encouraged pt to ambulate as far as she could for strength/endurance training. Pt became angry, slamming fist on rollator, stating "I don't want to do this, I want to go outside". Pt frustrated that PT was not pushing her outside in w/c. PT educated pt on importance of ambulating down hallway towards elevator and then therapist assisting with pushing her in w/c outside for therapeutic purposes. Pt requested rest break in w/c & afterwards ambulated 20 ft back to recliner in room; SpO2 = 81% on 2L/min. PT educated pt on pursed lip breathing & SpO2 increased to 90% in ~1 minute. Pt refused to participate in additional therapy on this date, repeatedly stating "I want to go home" throughout session. At end of session pt left sitting in recliner with all needs within reach & on 2L/min supplemental oxygen via nasal cannula.   Therapy Documentation Precautions:  Precautions Precautions: Fall Precaution Comments: limited by R knee pain Restrictions Weight Bearing  Restrictions: No  Pain: Pain Assessment Pain Assessment: 0-10 Pain Score: 3  Pain Location: Knee Pain Orientation: Right Pain Intervention(s):  (denied pain meds)   See Function Navigator for Current Functional Status.   Therapy/Group: Individual Therapy  Sandi Mariscal 10/03/2015, 7:51 AM

## 2015-10-03 NOTE — Progress Notes (Signed)
[place] PHYSICAL MEDICINE & REHABILITATION     PROGRESS NOTE  Subjective/Complaints:  Using BIPAP. Slept well. Some safety awareness issues.    ROS: Denies CP, SOB, N/V/D  Objective: Vital Signs: Blood pressure 101/57, pulse 100, temperature 98 F (36.7 C), temperature source Oral, resp. rate 20, weight 173.2 kg (381 lb 13.4 oz), last menstrual period 09/03/2015, SpO2 97 %. No results found.  Recent Labs  10/03/15 0555  WBC 3.5*  HGB 9.2*  HCT 34.5*  PLT 157    Recent Labs  10/02/15 0445  NA 140  K 4.1  CL 97*  GLUCOSE 92  BUN 9  CREATININE 0.55  CALCIUM 8.9   CBG (last 3)  No results for input(s): GLUCAP in the last 72 hours.  Wt Readings from Last 3 Encounters:  10/03/15 173.2 kg (381 lb 13.4 oz)  09/27/15 175.451 kg (386 lb 12.8 oz)  08/28/15 166.47 kg (367 lb)    Physical Exam:  BP 101/57 mmHg  Pulse 100  Temp(Src) 98 F (36.7 C) (Oral)  Resp 20  Wt 173.2 kg (381 lb 13.4 oz)  SpO2 97%  LMP 09/03/2015 (Approximate) Constitutional: She appears well-developed and well-nourished. NAD HENT: Normocephalic and atraumatic.  Eyes: Conjunctivae and EOM are normal.  Cardiovascular: Normal rate and regular rhythm. No murmur heard. Respiratory: Effort normal. No stridor. No respiratory distress. She has no wheezes.  GI: Soft. Bowel sounds are normal. She exhibits no distension. There is no tenderness.  Musculoskeletal: She exhibits edema and tenderness. Wrists swollen also Pedal edema bilaterally.1++   Neurological: She is alert and oriented.  Sensation intact to light touch Motor: B/l UE: 5/5 proximal to distal RLE: Hip flexion 3-/5, knee extension 4-/5, ankle dorsi/plantar flexion 4+/5 LLE: Hip flexion, knee extension 4+/5, ankle doris/plantar flexion 5/5  Skin: Skin is warm and dry. No rash noted. No erythema.  Psychiatric: She has a normal mood and affect. Her behavior is normal. Judgment and thought content normal.    Assessment/Plan: 1.  Functional deficits secondary to deconditioning which require 3+ hours per day of interdisciplinary therapy in a comprehensive inpatient rehab setting. Physiatrist is providing close team supervision and 24 hour management of active medical problems listed below. Physiatrist and rehab team continue to assess barriers to discharge/monitor patient progress toward functional and medical goals.  Function:  Bathing Bathing position   Position: Shower  Bathing parts Body parts bathed by patient: Right arm, Left arm, Chest, Abdomen, Front perineal area, Buttocks, Right upper leg, Left upper leg, Right lower leg, Left lower leg    Bathing assist Assist Level: Set up   Set up : To obtain items  Upper Body Dressing/Undressing Upper body dressing   What is the patient wearing?: Pull over shirt/dress     Pull over shirt/dress - Perfomed by patient: Thread/unthread right sleeve, Thread/unthread left sleeve, Put head through opening, Pull shirt over trunk          Upper body assist Assist Level: Set up   Set up : To obtain clothing/put away  Lower Body Dressing/Undressing Lower body dressing   What is the patient wearing?: Non-skid slipper socks (skirt)         Non-skid slipper socks- Performed by patient: Don/doff right sock, Don/doff left sock Non-skid slipper socks- Performed by helper: Don/doff right sock, Don/doff left sock                  Lower body assist Assist for lower body dressing: Supervision or verbal cues  Toileting Toileting   Toileting steps completed by patient: Performs perineal hygiene, Adjust clothing prior to toileting, Adjust clothing after toileting Toileting steps completed by helper: Adjust clothing after toileting Toileting Assistive Devices: Grab bar or rail  Toileting assist Assist level: Supervision or verbal cues   Transfers Chair/bed transfer   Chair/bed transfer method: Stand pivot Chair/bed transfer assist level: Supervision or verbal  cues Chair/bed transfer assistive device: Environmental consultant, Designer, fashion/clothing     Max distance: 16ft Assist level: Supervision or verbal cues   Wheelchair Wheelchair activity did not occur: Safety/medical concerns Type: Manual Max wheelchair distance: 88ft Assist Level: Touching or steadying assistance (Pt > 75%)  Cognition Comprehension Comprehension assist level: Follows complex conversation/direction with no assist  Expression Expression assist level: Expresses complex ideas: With no assist  Social Interaction Social Interaction assist level: Interacts appropriately with others - No medications needed.  Problem Solving Problem solving assist level: Solves complex problems: Recognizes & self-corrects  Memory Memory assist level: Complete Independence: No helper    Medical Problem List and Plan: 1. RLE weakness, abnormality of gait secondary to deconditioning.   -continue CIR therapies.  2. DVT Prophylaxis/Anticoagulation: Pharmaceutical: Lovenox 3. Pain Management: Mobic daily. Added sportscreme as well as ice for local measures.  4. Mood: LCSW to follow for evaluation and support.  5. Neuropsych: This patient is capable of making decisions on her own behalf. 6. Skin/Wound Care: routine pressure relief measures.  7. Fluids/Electrolytes/Nutrition:     BMET holding steady--recheck tomorrow 8. Acute on chronic diastolic CHF with acute hypoxic respiratory failure:    -weights appear to be accurate (lower weight was not)--now trending down  -had EF of 55% on recent echo [facility] Weights   10/01/15 0616 10/02/15 0556 10/03/15 0459  Weight: 175.3 kg (386 lb 7.5 oz) 171.5 kg (378 lb 1.4 oz) 173.2 kg (381 lb 13.4 oz)     -continue lasix  bid for now.   9. Non-obstructive CAD: Low dose ASA daily. 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 Educate patient on importance of compliance after discharge. 11. Vitamin D  deficiency: On supplement bid with follow up recommended on 08/01. 12. OHS/OSA: BIPAP at nights and whenever asleep.  13. Hypotension: Due to diuresis. Monitor for symptoms as well as renal status for AKI.  14. Morbid Obesity: Heart healthy diet-- added low salt diet. Will have dietician follow up closer to discharge to see if patient willing to modify diet after discharge.  15. Anemia  Hb 9.7 on 6/6 (improving)----recheck monday  Will cont to monitor   LOS (Days) 6 A FACE TO FACE EVALUATION WAS PERFORMED  Kweku Stankey T 10/03/2015 8:44 AM

## 2015-10-03 NOTE — Progress Notes (Signed)
Refuses bedalarm and doesn't like staff helping with transfers. Dana Abbott

## 2015-10-04 ENCOUNTER — Inpatient Hospital Stay (HOSPITAL_COMMUNITY): Payer: PRIVATE HEALTH INSURANCE | Admitting: Occupational Therapy

## 2015-10-04 ENCOUNTER — Inpatient Hospital Stay (HOSPITAL_COMMUNITY): Payer: PRIVATE HEALTH INSURANCE | Admitting: Physical Therapy

## 2015-10-04 DIAGNOSIS — D509 Iron deficiency anemia, unspecified: Secondary | ICD-10-CM | POA: Diagnosis not present

## 2015-10-04 DIAGNOSIS — R5381 Other malaise: Secondary | ICD-10-CM | POA: Diagnosis not present

## 2015-10-04 DIAGNOSIS — Z9884 Bariatric surgery status: Secondary | ICD-10-CM | POA: Diagnosis not present

## 2015-10-04 DIAGNOSIS — I5033 Acute on chronic diastolic (congestive) heart failure: Secondary | ICD-10-CM | POA: Diagnosis not present

## 2015-10-04 DIAGNOSIS — R269 Unspecified abnormalities of gait and mobility: Secondary | ICD-10-CM | POA: Diagnosis not present

## 2015-10-04 LAB — BASIC METABOLIC PANEL
Anion gap: 6 (ref 5–15)
BUN: 6 mg/dL (ref 6–20)
CHLORIDE: 100 mmol/L — AB (ref 101–111)
CO2: 33 mmol/L — ABNORMAL HIGH (ref 22–32)
CREATININE: 0.5 mg/dL (ref 0.44–1.00)
Calcium: 8.7 mg/dL — ABNORMAL LOW (ref 8.9–10.3)
Glucose, Bld: 93 mg/dL (ref 65–99)
POTASSIUM: 3.9 mmol/L (ref 3.5–5.1)
SODIUM: 139 mmol/L (ref 135–145)

## 2015-10-04 LAB — CBC
HCT: 35 % — ABNORMAL LOW (ref 36.0–46.0)
HEMOGLOBIN: 9.4 g/dL — AB (ref 12.0–15.0)
MCH: 23.7 pg — ABNORMAL LOW (ref 26.0–34.0)
MCHC: 26.9 g/dL — AB (ref 30.0–36.0)
MCV: 88.2 fL (ref 78.0–100.0)
PLATELETS: 182 10*3/uL (ref 150–400)
RBC: 3.97 MIL/uL (ref 3.87–5.11)
RDW: 24.5 % — ABNORMAL HIGH (ref 11.5–15.5)
WBC: 3.3 10*3/uL — ABNORMAL LOW (ref 4.0–10.5)

## 2015-10-04 NOTE — Progress Notes (Signed)
Occupational Therapy Session Note  Patient Details  Name: Dana Abbott MRN: [id number] Date of Birth: Sep 25, 1965  Today's Date: 10/04/2015 OT Individual Time: 1615-1700 OT Individual Time Calculation (min): 45 min    Short Term Goals: Week 1:  OT Short Term Goal 1 (Week 1): Pt will be able to tolerate standing for at least 5 minutes to enable her to do light house keeping.  OT Short Term Goal 2 (Week 1): Pt will be able to transfer into bathtub with min A using tub bench. OT Short Term Goal 3 (Week 1): Pt will be able to don socks or shoes without A using AE if needed.  Skilled Therapeutic Interventions/Progress Updates:    Pt seen for OT session focusing on ADL retraining and function activity tolerance/ balance. Pt sitting up in recliner upon arrival, voicing need to complete toileting task. She ambulated throughout room with rollator and supervision, assist provided for management of O2 line. Toileting task and transfers competed at supervision- mod I level. She declined walking in hallway this session due to fatigue from therapies during the day.  Pt taken in w/c to ADL apartment kitchen for time and energy conservation. In kitchen, pt stood at kitchen counter to remove and replace items from overhead cabinet. 1 UE support required for dynamic balance. She tolerated ~3-4 minutes of standing before returning to w/c for seated rest break. She self propelled w/c with overall mod A back to room for UE strengthening/ endurance. She ambulated in room to return to recliner, left with all needs in reach.   Therapy Documentation Precautions:  Precautions Precautions: Fall Precaution Comments: limited by R knee pain Restrictions Weight Bearing Restrictions: No Pain: Pain Assessment Pain Assessment: No/denies pain  See Function Navigator for Current Functional Status.   Therapy/Group: Individual Therapy  Lewis, Wenda Vanschaick C 10/04/2015, 5:44 PM

## 2015-10-04 NOTE — Progress Notes (Signed)
[facility]     PROGRESS NOTE  Subjective/Complaints:  Pt seen sitting up at the edge of her bed.  She states she would like to go home and believes she is doing fairly well.   ROS: Denies CP, SOB, N/V/D  Objective: Vital Signs: Blood pressure 122/66, pulse 85, temperature 97.9 F (36.6 C), temperature source Axillary, resp. rate 19, weight 172.1 kg (379 lb 6.6 oz), last menstrual period 09/03/2015, SpO2 100 %. No results found.  Recent Labs  10/03/15 0555 10/04/15 0533  WBC 3.5* 3.3*  HGB 9.2* 9.4*  HCT 34.5* 35.0*  PLT 157 182    Recent Labs  10/02/15 0445 10/04/15 0533  NA 140 139  K 4.1 3.9  CL 97* 100*  GLUCOSE 92 93  BUN 9 6  CREATININE 0.55 0.50  CALCIUM 8.9 8.7*   CBG (last 3)  No results for input(s): GLUCAP in the last 72 hours.  Wt Readings from Last 3 Encounters:  10/04/15 172.1 kg (379 lb 6.6 oz)  09/27/15 175.451 kg (386 lb 12.8 oz)  08/28/15 166.47 kg (367 lb)    Physical Exam:  BP 122/66 mmHg  Pulse 85  Temp(Src) 97.9 F (36.6 C) (Axillary)  Resp 19  Wt 172.1 kg (379 lb 6.6 oz)  SpO2 100%  LMP 09/03/2015 (Approximate) Constitutional: She appears well-developed and well-nourished. NAD HENT: Normocephalic and atraumatic.  Eyes: Conjunctivae and EOM are normal.  Cardiovascular: Normal rate and regular rhythm. No murmur heard. Respiratory: Effort normal. No stridor. No respiratory distress. She has no wheezes.  GI: Soft. Bowel sounds are normal. She exhibits no distension. There is no tenderness.  Musculoskeletal: She exhibits edema and tenderness. Wrists swollen also Pedal edema bilaterally.1++   Neurological: She is alert and oriented.  Motor: B/l UE: 5/5 proximal to distal RLE: Hip flexion 4/5, knee extension 4/5, ankle dorsi/plantar flexion 5/5 LLE: Hip flexion, knee extension 4/5, ankle doris/plantar flexion 5/5  Skin: Skin is warm and dry. No rash noted. No erythema.  Psychiatric: She has a  normal mood and affect. Her behavior is normal. Judgment and thought content normal.    Assessment/Plan: 1. Functional deficits secondary to deconditioning which require 3+ hours per day of interdisciplinary therapy in a comprehensive inpatient rehab setting. Physiatrist is providing close team supervision and 24 hour management of active medical problems listed below. Physiatrist and rehab team continue to assess barriers to discharge/monitor patient progress toward functional and medical goals.  Function:  Bathing Bathing position   Position: Shower  Bathing parts Body parts bathed by patient: Right arm, Left arm, Chest, Abdomen, Front perineal area, Buttocks, Right upper leg, Left upper leg, Right lower leg, Left lower leg    Bathing assist Assist Level: Set up   Set up : To obtain items  Upper Body Dressing/Undressing Upper body dressing   What is the patient wearing?: Pull over shirt/dress     Pull over shirt/dress - Perfomed by patient: Thread/unthread right sleeve, Thread/unthread left sleeve, Put head through opening, Pull shirt over trunk          Upper body assist Assist Level: Set up   Set up : To obtain clothing/put away  Lower Body Dressing/Undressing Lower body dressing   What is the patient wearing?: Non-skid slipper socks (skirt)         Non-skid slipper socks- Performed by patient: Don/doff right sock, Don/doff left sock Non-skid slipper socks- Performed by helper: Don/doff right sock, Don/doff left sock  Lower body assist Assist for lower body dressing: Supervision or verbal cues      Toileting Toileting   Toileting steps completed by patient: Adjust clothing prior to toileting, Performs perineal hygiene, Adjust clothing after toileting Toileting steps completed by helper: Adjust clothing after toileting Toileting Assistive Devices: Grab bar or rail  Toileting assist Assist level: Supervision or verbal cues    Transfers Chair/bed transfer   Chair/bed transfer method: Stand pivot Chair/bed transfer assist level: Supervision or verbal cues Chair/bed transfer assistive device: Walker, Designer, fashion/clothing     Max distance: 16 Assist level: Supervision or verbal cues   Wheelchair Wheelchair activity did not occur: Safety/medical concerns Type: Manual Max wheelchair distance: 36ft Assist Level: Touching or steadying assistance (Pt > 75%)  Cognition Comprehension Comprehension assist level: Follows complex conversation/direction with no assist  Expression Expression assist level: Expresses complex ideas: With no assist  Social Interaction Social Interaction assist level: Interacts appropriately with others - No medications needed.  Problem Solving Problem solving assist level: Solves complex problems: Recognizes & self-corrects  Memory Memory assist level: Complete Independence: No helper    Medical Problem List and Plan: 1. RLE weakness, abnormality of gait secondary to deconditioning.   -continue CIR, pt wants to go home, will discuss with therapies and CW 2. DVT Prophylaxis/Anticoagulation: Pharmaceutical: Lovenox 3. Pain Management: Mobic daily. Added sportscreme as well as ice for local measures.  4. Mood: LCSW to follow for evaluation and support.  5. Neuropsych: This patient is capable of making decisions on her own behalf. 6. Skin/Wound Care: routine pressure relief measures.  7. Fluids/Electrolytes/Nutrition:     -BMP stable on 6/12 8. Acute on chronic diastolic CHF with acute hypoxic respiratory failure:    -weights appear to be trending down  -had EF of 55% on recent echo Filed Weights   10/02/15 0556 10/03/15 0459 10/04/15 0528  Weight: 171.5 kg (378 lb 1.4 oz) 173.2 kg (381 lb 13.4 oz) 172.1 kg (379 lb 6.6 oz)   -continue lasix  bid for now. 9. Non-obstructive CAD: Low dose ASA daily. 10 Gastric bypass with pernicious and iron deficiency anemia:  Continue Vit. B 12 and iron supplement. Educate patient on importance of compliance after discharge. 11. Vitamin D deficiency: On supplement bid with follow up recommended on 08/01. 12. OHS/OSA: BIPAP at nights and whenever asleep.  13. Hypotension: Due to diuresis. Monitor for symptoms as well as renal status for AKI.  14. Severe Obesity: Heart healthy diet-- added low salt diet. Will have dietician follow up closer to discharge to see if patient willing to modify diet after discharge.  15. Anemia  Hb 9.4 on 6/12 gradually improving  Will cont to monitor  LOS (Days) 7 A FACE TO FACE EVALUATION WAS PERFORMED  Ankit Karis Juba 10/04/2015 9:32 AM

## 2015-10-04 NOTE — Progress Notes (Signed)
Occupational Therapy Session Note  Patient Details  Name: Dana Abbott MRN: [id number] Date of Birth: Aug 30, 1965  Today's Date: 10/04/2015 OT Individual Time: 8421-0312 OT Individual Time Calculation (min): 29 min    Skilled Therapeutic Interventions/Progress Updates:    Pt completed UE strengthening work while sitting in the bedside chair.  Had pt hold 5 lb dowel rod while having to hit a beach ball back and forth with the therapist.  Oxygen sats at 94% on 2ls nasal cannula post exercise.  Pt completed multiple sets while also integrating bicep curls bilaterally for 3 sets of 15 repetitions while resting her shoulders.  Completed 2 sets of 15 and 20 reps for bilateral shoulder row as well using medium resistance orange theraband.  Pt with no report of pain with exercises.  Pt left in bedside recliner with call button in reach.    Therapy Documentation Precautions:  Precautions Precautions: Fall Precaution Comments: limited by R knee pain Restrictions Weight Bearing Restrictions: No  Pain: Pain Assessment Pain Assessment: No/denies pain Pain Score: 4  ADL: See Function Navigator for Current Functional Status.   Therapy/Group: Individual Therapy  Traylen Eckels OTR/L 10/04/2015, 4:01 PM

## 2015-10-04 NOTE — Discharge Summary (Signed)
Physician Discharge Summary  Patient ID: Dana Abbott MRN: [id number] DOB/AGE: 48-Nov-1967 48 y.o.  Admit date: 09/27/2015 Discharge date: 10/06/2015  Discharge Diagnoses:  Principal Problem:   Debility Active Problems:   Abnormality of gait   Fluid retention   Hypoalbuminemia due to protein-calorie malnutrition (HCC)   Acute on chronic diastolic (congestive) heart failure (HCC)   Coronary artery disease involving native coronary artery of native heart without angina pectoris   Iron deficiency anemia   S/P gastric bypass   Vitamin D deficiency   OSA treated with BiPAP   Vitamin B 12 deficiency   Discharged Condition: stable    Labs:  Basic Metabolic Panel:  Recent Labs Lab 09/30/15 0510 10/02/15 0445 10/04/15 0533  NA 138 140 139  K 3.9 4.1 3.9  CL 96* 97* 100*  CO2 36* 35* 33*  GLUCOSE 90 92 93  BUN 9 9 6   CREATININE 0.55 0.55 0.50  CALCIUM 8.7* 8.9 8.7*    CBC:  Recent Labs Lab 10/03/15 0555 10/04/15 0533  WBC 3.5* 3.3*  HGB 9.2* 9.4*  HCT 34.5* 35.0*  MCV 86.0 88.2  PLT 157 182    CBG: No results for input(s): GLUCAP in the last 168 hours.   Brief HPI:   Dana Abbott is a 50 y.o. female with history of severe obesity s/p gastric bypass, exertional dyspnea, CHF, non-obstructive CAD s/p MI 2003, recent admission 07/2015 for CP, SOB due to ABLA and acute exacerbation of CHF. She was readmitted on 09/22/15 with progressive SOB, CP and BLE edema. BLE negative for DVT and Chest CT negative for PE. Blood cultures and UCS negative for infection. She was treated with IV diuresis for management of acute respiratory failure due to CHF exacerbation and in setting of OHS. She was placed on B12 injections due to recent diagnosis of pernicious anemia. 2 D echo showed EF 55% with no wall abnormality, mild LVH, moderately dilated RA and elevated pulmonary pressures. Has declined nutritional education regarding weight loss and has been educated on importance on  vitamin supplementation. She continues to be hypoxic requiring supplemental oxygen and noted to be severely debilitated. Therapy ongoing and CIR recommended by MD and rehab team.    Hospital Course: Timi Pluff was admitted to rehab 09/27/2015 for inpatient therapies to consist of PT and OT at least three hours five days a week. Past admission physiatrist, therapy team and rehab RN have worked together to provide customized collaborative inpatient rehab. Mood has been stable and endurance has improved.  Acute on chronic CHF has been monitored with daily weights and this has trended down to 169  kg. No signs of overload noted. She has not been interested in following heart healthy diet and family has been supplementing with meals and additional snacks during her stay. Blood pressures have been well controlled and she is continent of bowel and bladder. CBC done shows H/H to be stable. She was maintained on iron supplement tid and weekly Vitamin B 12 injection --last on 06/14 and HHRN to continue weekly injections after discharge. She has been instructed on importance of continuing current vitamin regimen due to her history of gastric bypass.   Chronic knee pain has been managed with mobic as well as local measures with sports creme. She continues to have hypoxia with activity and is currently requiring 2 liter per [place].  She has been compliant with  CPAP use during her stay and has been referred to sleep clinic at [facility] for formal study.  She did did not meet her independent goals due to her insistence on early discharge as she felt that she did not require further therapy. She currently requires supervision with ambulation.  Family education not completed but patient is able to direct her care. She was given MATCH to help with a month of medications and has been set up with Dr. Delrae Alfred for post hospital follow up.     Rehab course: During patient's stay in rehab weekly team conferences were  held to monitor patient's progress, set goals and discuss barriers to discharge. At admission, patient required min assist with basic self care tasks and min assist with mobility. She has had improvement in activity tolerance, balance, postural control, as well as ability to compensate for deficits.  She requires set up assist/distant supervision with LB dressing.  She is able to bathe at modified independent level with set up assist. She requires supervision with transfers and for ambulation with rollater. She has decreased speed and has been able to ambulate 40 feet with  Rollater.    Disposition: home   Diet: Heart Healthy. Low salt.   Special Instructions: 1. Continue current vitamin regimen due to your history of bypass.       Discharge Instructions    Ambulatory referral to Physical Medicine Rehab    Complete by:  As directed   1-2 week follow up/Debility     Ambulatory referral to Sleep Studies    Complete by:  As directed             Medication List    STOP taking these medications        TYLENOL 8 HOUR ARTHRITIS PAIN PO  Replaced by:  acetaminophen 325 MG tablet      TAKE these medications        acetaminophen 325 MG tablet  Commonly known as:  TYLENOL  Take 2 tablets (650 mg total) by mouth every 6 (six) hours as needed for mild pain.     aspirin 81 MG chewable tablet  Chew 162 mg by mouth once.     aspirin 81 MG EC tablet  Take 1 tablet (81 mg total) by mouth daily.     Cholecalciferol 10000 units Tabs  Take 10,000 Units by mouth daily.     docusate sodium 100 MG capsule  Commonly known as:  COLACE  Take 1 capsule (100 mg total) by mouth 2 (two) times daily.     ferrous sulfate 325 (65 FE) MG tablet  Take 1 tablet (325 mg total) by mouth 3 (three) times daily with meals.     furosemide 40 MG tablet  Commonly known as:  LASIX  Take 1 tablet (40 mg total) by mouth 2 (two) times daily.     meloxicam 15 MG tablet  Commonly known as:  MOBIC  Take 1  tablet (15 mg total) by mouth daily.     multivitamin Tabs tablet  Take 1 tablet by mouth daily.     multivitamin Tabs tablet  Take 1 tablet by mouth daily.     MUSCLE RUB 10-15 % Crea  Apply 1 application topically 4 (four) times daily -  with meals and at bedtime.     potassium chloride SA 20 MEQ tablet  Commonly known as:  K-DUR,KLOR-CON  Take 1 tablet (20 mEq total) by mouth daily.     vitamin B-12 100 MCG tablet  Commonly known as:  CYANOCOBALAMIN  Take 1 tablet (100 mcg total) by mouth 2 (two)  times daily.       Follow-up Information    Follow up with [facility], MD On 11/02/2015.   Specialty:  Internal Medicine   Why:  APPT @ 11:30 Am   Contact information:   [address] [place] [address] [phone]       Follow up with Ankit Karis Juba, MD.   Specialty:  Physical Medicine and Rehabilitation   Why:  NEW ADDRESS: [address]. [place], [id number]. Tel# [phone]--. Office will call you with follow up appointment   Contact information:   [address] [place] [address] [phone]       Follow up with [facility].   Why:  will contact you to set up sleep study      Signed: Jacquelynn Cree 10/06/2015, 7:13 PM

## 2015-10-04 NOTE — Progress Notes (Signed)
Patient placed on BIPAP QHS and doing well. 2L O2 bleed in.

## 2015-10-04 NOTE — Progress Notes (Signed)
Pt is on NIV at this time tolerating it well.  

## 2015-10-04 NOTE — Progress Notes (Signed)
Occupational Therapy Session Note  Patient Details  Name: Dana Abbott MRN: [id number] Date of Birth: 07/20/1965  Today's Date: 10/04/2015 OT Individual Time: 0677-0340 OT Individual Time Calculation (min): 57 min    Short Term Goals: Week 1:  OT Short Term Goal 1 (Week 1): Pt will be able to tolerate standing for at least 5 minutes to enable her to do light house keeping.  OT Short Term Goal 2 (Week 1): Pt will be able to transfer into bathtub with min A using tub bench. OT Short Term Goal 3 (Week 1): Pt will be able to don socks or shoes without A using AE if needed.  Skilled Therapeutic Interventions/Progress Updates:    Treatment session with focus on standing balance and activity tolerance during therapeutic activities.  Pt received seated at sink reporting just having finished bathing and dressing.  Pt reports no concerns with self-care tasks and expresses desire to go home asap.  Engaged in discussion regarding increased activity tolerance and balance during ADL and IADL tasks.  Pt ambulated with Rollator 60 feet and then 55 feet with approx 3 min seated rest break between distances.  Pt O2 dropped to 81% on 2L through nasal canula, required cues for breathing technique to increase oxygen with level returning to mid 90s in <1 min.  Engaged in Wii bowling activity at sit > stand level with pt completing sit > stand prior to each turn without assist, however unable to maintain standing for longer then 2-3 min at a time.  Pt reports increased knee weakness as session progressed.  Returned to room at end of session via w/c due to pain and decreased endurance.  Therapy Documentation Precautions:  Precautions Precautions: Fall Precaution Comments: limited by R knee pain Restrictions Weight Bearing Restrictions: No Pain: Reports intermittent pain in Rt knee, provided rest breaks.    See Function Navigator for Current Functional Status.   Therapy/Group: Individual Therapy  Rosalio Loud 10/04/2015, 11:13 AM

## 2015-10-04 NOTE — Progress Notes (Signed)
Physical Therapy Session Note  Patient Details  Name: Dana Abbott MRN: [id number] Date of Birth: 1965-04-29  Today's Date: 10/04/2015 PT Individual Time: 1102-1200 PT Individual Time Calculation (min): 58 min   Short Term Goals: Week 1:  PT Short Term Goal 1 (Week 1): Pt will complete bed mobility in regular bed with Min A. PT Short Term Goal 2 (Week 1): Pt will ambulate 20 ft with LRAD & min A.  Skilled Therapeutic Interventions/Progress Updates:    Pt received in w/c & agreeable to PT. Pt noted 2/10 R knee pain at beginning of session, increased to 5-6/10 towards end of session & only requested pain medication at end of sesion. Session focused on gait training & activity tolerance. Gait training x 75 ft + 80 ft + 80 ft + 50 ft with rollator & supervision. Pt with decreased step length BLE & decreased gait speed. Pt required 2L/min supplemental oxygen & SpO2 dropped to 82% after gait training, with pt reporting lightheadedness on one occasion, but SpO2 increased to >90% after ~1 minute rest break & pursed lip breathing. Pt required extended seated rest breaks between trials & was able to safely demonstrate parking rollator by wall & locking it before transferring stand>sit. Pt did require cuing to keep rollator locked until after standing when transferring sit>stand. Pt able to tolerate standing for 1 minute 10 seconds + 1 min 20 seconds while playing Connect Four for endurance training purposes. At end of session pt left in recliner with all needs within reach.   Therapy Documentation Precautions:  Precautions Precautions: Fall Precaution Comments: limited by R knee pain Restrictions Weight Bearing Restrictions: No  Pain: Pain Assessment Pain Assessment: 0-10 Pain Score: 2  Pain Location: Knee Pain Orientation: Right Pain Intervention(s): Repositioned;Ambulation/increased activity    See Function Navigator for Current Functional Status.   Therapy/Group: Individual  Therapy  Sandi Mariscal 10/04/2015, 7:54 AM

## 2015-10-05 ENCOUNTER — Inpatient Hospital Stay (HOSPITAL_COMMUNITY): Payer: PRIVATE HEALTH INSURANCE | Admitting: Physical Therapy

## 2015-10-05 ENCOUNTER — Inpatient Hospital Stay (HOSPITAL_COMMUNITY): Payer: PRIVATE HEALTH INSURANCE | Admitting: Occupational Therapy

## 2015-10-05 DIAGNOSIS — R5381 Other malaise: Secondary | ICD-10-CM | POA: Diagnosis not present

## 2015-10-05 DIAGNOSIS — Z9884 Bariatric surgery status: Secondary | ICD-10-CM | POA: Diagnosis not present

## 2015-10-05 DIAGNOSIS — I5033 Acute on chronic diastolic (congestive) heart failure: Secondary | ICD-10-CM | POA: Diagnosis not present

## 2015-10-05 DIAGNOSIS — R269 Unspecified abnormalities of gait and mobility: Secondary | ICD-10-CM | POA: Diagnosis not present

## 2015-10-05 MED ORDER — ACETAMINOPHEN 325 MG PO TABS: 650.0000 mg | ORAL_TABLET | Freq: Four times a day (QID) | ORAL | Status: AC | PRN

## 2015-10-05 MED ORDER — MUSCLE RUB 10-15 % EX CREA: 1.0000 "application " | TOPICAL_CREAM | Freq: Three times a day (TID) | CUTANEOUS | Status: DC

## 2015-10-05 NOTE — Progress Notes (Signed)
[place] PHYSICAL MEDICINE & REHABILITATION     PROGRESS NOTE  Subjective/Complaints:  Patient seen working with OT this morning. She notes some left knee pain that is chronic. She insists that she would like to go home tomorrow.  ROS: + Left knee pain. Denies CP, SOB, N/V/D  Objective: Vital Signs: Blood pressure 107/62, pulse 80, temperature 97.9 F (36.6 C), temperature source Oral, resp. rate 18, weight 172 kg (379 lb 3.1 oz), last menstrual period 09/03/2015, SpO2 95 %. No results found.  Recent Labs  10/03/15 0555 10/04/15 0533  WBC 3.5* 3.3*  HGB 9.2* 9.4*  HCT 34.5* 35.0*  PLT 157 182    Recent Labs  10/04/15 0533  NA 139  K 3.9  CL 100*  GLUCOSE 93  BUN 6  CREATININE 0.50  CALCIUM 8.7*   CBG (last 3)  No results for input(s): GLUCAP in the last 72 hours.  Wt Readings from Last 3 Encounters:  10/05/15 172 kg (379 lb 3.1 oz)  09/27/15 175.451 kg (386 lb 12.8 oz)  08/28/15 166.47 kg (367 lb)    Physical Exam:  BP 107/62 mmHg  Pulse 80  Temp(Src) 97.9 F (36.6 C) (Oral)  Resp 18  Wt 172 kg (379 lb 3.1 oz)  SpO2 95%  LMP 09/03/2015 (Approximate) Constitutional: She appears well-developed. NAD. Morbidly obese. HENT: Normocephalic and atraumatic.  Eyes: Conjunctivae and EOM are normal.  Cardiovascular: Normal rate and regular rhythm. No murmur heard. Respiratory: Effort normal. No stridor. No respiratory distress. She has no wheezes.  GI: Soft. Bowel sounds are normal. She exhibits no distension. There is no tenderness.  Musculoskeletal: She exhibits edema and tenderness. Wrists swollen also Pedal edema bilaterally.1++   Neurological: She is alert and oriented.  Motor: B/l UE: 5/5 proximal to distal RLE: Hip flexion 4/5, knee extension 4/5, ankle dorsi/plantar flexion 5/5 LLE: Hip flexion, knee extension 4/5, ankle doris/plantar flexion 5/5  Skin: Skin is warm and dry. No rash noted. No erythema.  Psychiatric: She has a normal mood and  affect. Her behavior is normal. Judgment and thought content normal.    Assessment/Plan: 1. Functional deficits secondary to deconditioning which require 3+ hours per day of interdisciplinary therapy in a comprehensive inpatient rehab setting. Physiatrist is providing close team supervision and 24 hour management of active medical problems listed below. Physiatrist and rehab team continue to assess barriers to discharge/monitor patient progress toward functional and medical goals.  Function:  Bathing Bathing position   Position: Shower  Bathing parts Body parts bathed by patient: Right arm, Left arm, Chest, Abdomen, Front perineal area, Buttocks, Right upper leg, Left upper leg, Right lower leg, Left lower leg, Back    Bathing assist Assist Level: Set up   Set up : To obtain items  Upper Body Dressing/Undressing Upper body dressing   What is the patient wearing?: Pull over shirt/dress, Bra Bra - Perfomed by patient: Thread/unthread right bra strap, Thread/unthread left bra strap, Hook/unhook bra (pull down sports bra)   Pull over shirt/dress - Perfomed by patient: Thread/unthread right sleeve, Thread/unthread left sleeve, Put head through opening, Pull shirt over trunk          Upper body assist Assist Level: Set up   Set up : To obtain clothing/put away  Lower Body Dressing/Undressing Lower body dressing   What is the patient wearing?: Non-skid slipper socks         Non-skid slipper socks- Performed by patient: Don/doff right sock, Don/doff left sock Non-skid slipper socks-  Performed by helper: Don/doff right sock, Don/doff left sock                  Lower body assist Assist for lower body dressing: Supervision or verbal cues      Toileting Toileting   Toileting steps completed by patient: Adjust clothing prior to toileting, Performs perineal hygiene, Adjust clothing after toileting Toileting steps completed by helper: Adjust clothing after toileting Toileting  Assistive Devices: Grab bar or rail  Toileting assist Assist level: More than reasonable time   Transfers Chair/bed transfer   Chair/bed transfer method: Stand pivot Chair/bed transfer assist level: Supervision or verbal cues Chair/bed transfer assistive device: Walker, Designer, fashion/clothing     Max distance: 12 Assist level: Supervision or verbal cues   Wheelchair Wheelchair activity did not occur: Safety/medical concerns Type: Manual Max wheelchair distance: 44ft Assist Level: Touching or steadying assistance (Pt > 75%)  Cognition Comprehension Comprehension assist level: Follows complex conversation/direction with no assist  Expression Expression assist level: Expresses complex ideas: With no assist  Social Interaction Social Interaction assist level: Interacts appropriately with others - No medications needed.  Problem Solving Problem solving assist level: Solves complex problems: Recognizes & self-corrects  Memory Memory assist level: Complete Independence: No helper    Medical Problem List and Plan: 1. RLE weakness, abnormality of gait secondary to deconditioning.   -continue CIR, pt would like to go home after therapies tomorrow, will discuss with team 2. DVT Prophylaxis/Anticoagulation: Pharmaceutical: Lovenox 3. Pain Management: Mobic daily. Added sportscreme as well as ice for local measures.  4. Mood: LCSW to follow for evaluation and support.  5. Neuropsych: This patient is capable of making decisions on her own behalf. 6. Skin/Wound Care: routine pressure relief measures.  7. Fluids/Electrolytes/Nutrition:     -BMP stable on 6/12 8. Acute on chronic diastolic CHF with acute hypoxic respiratory failure:    -weights appear to be trending down  -had EF of 55% on recent echo Filed Weights   10/03/15 0459 10/04/15 0528 10/05/15 0602  Weight: 173.2 kg (381 lb 13.4 oz) 172.1 kg (379 lb 6.6 oz) 172 kg (379 lb 3.1 oz)   -continue lasix  bid. 9.  Non-obstructive CAD: Low dose ASA daily. 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 B 12 and iron supplement. Educate patient on importance of compliance after discharge. 11. Vitamin D deficiency: On supplement bid with follow up recommended on 08/01. 12. OHS/OSA: BIPAP at nights and whenever asleep.  13. Hypotension: Due to diuresis. Monitor for symptoms as well as renal status for AKI.  14. Severe Obesity: Heart healthy diet-- added low salt diet. Will have dietician follow up closer to discharge to see if patient willing to modify diet after discharge.  15. Anemia  Hb 9.4 on 6/12 gradually improving  Will cont to monitor  LOS (Days) 8 A FACE TO FACE EVALUATION WAS PERFORMED  Mardel Grudzien Karis Juba 10/05/2015 9:22 AM

## 2015-10-05 NOTE — Progress Notes (Signed)
Social Work Patient ID: Dana Abbott, female   DOB: 18-Oct-1965, 50 y.o.   MRN: [id number] Pt requesting to go home tomorrow after therapies, MD has approved this and will let team know the plan. Will order home O2 and set up follow up. Pt has done well here and has reached her goals of mod/i-supervision level.

## 2015-10-05 NOTE — Progress Notes (Signed)
Recreational Therapy Session Note  Patient Details  Name: Dana Abbott MRN: [id number] Date of Birth: 16-Jan-1966 Today's Date: 10/05/2015   Eval deferred as pt is scheduled for discharge 10/07/15. Jakelin Taussig 10/05/2015, 9:20 AM

## 2015-10-05 NOTE — Plan of Care (Signed)
Problem: RH Ambulation Goal: LTG Patient will ambulate in controlled environment (PT) LTG: Patient will ambulate in a controlled environment, # of feet with assistance (PT).  Outcome: Completed/Met Date Met:  10/05/15 80 ft with rollator Goal: LTG Patient will ambulate in home environment (PT) LTG: Patient will ambulate in home environment, # of feet with assistance (PT).  Outcome: Completed/Met Date Met:  10/05/15 25 ft with rollator  Problem: RH Stairs Goal: LTG Patient will ambulate up and down stairs w/assist (PT) LTG: Patient will ambulate up and down # of stairs with assistance (PT)  Outcome: Completed/Met Date Met:  10/05/15 4 steps with B rails

## 2015-10-05 NOTE — Discharge Instructions (Signed)
Inpatient Rehab Discharge Instructions  Aisla Roblyer Discharge date and time:  10/06/15  Activities/Precautions/ Functional Status: Activity: no lifting, driving, or strenuous exercise for till cleared by MD Diet: low fat, low cholesterol diet Low salt Wound Care: none needed Functional status:  ___ No restrictions     ___ Walk up steps independently ___ 24/7 supervision/assistance   ___ Walk up steps with assistance ___ Intermittent supervision/assistance  ___ Bathe/dress independently _X__ Walk with walker     ___ Bathe/dress with assistance ___ Walk Independently    ___ Shower independently ___ Walk with assistance    ___ Shower with assistance _X__ No alcohol     ___ Return to work/school ________  Special Instructions: 1. Eat frequent small meals.  2. Continue current vitamin regimen because you stomach does not absorb these adequately from your diet.  3. Use oxygen with activity and when asleep. Use CPAP at bedtime.    COMMUNITY REFERRALS UPON DISCHARGE:    Home Health:  RN   Agency:[facility] Phone:[phone]   Date of last service:10/06/2015  Medical Equipment/Items Ordered:NONE HAD ALREADY   Other:[facility]-July 11 @ 11:30 AM-MEDICAL FOLLOW UP FOLLOW UP WITH [employer] AND MEDICAID APPLICATIONS MATCH PROGRAM TO ASSIST WITH MEDICATIONS.   My questions have been answered and I understand these instructions. I will adhere to these goals and the provided educational materials after my discharge from the hospital.  Patient/Caregiver Signature _______________________________ Date __________  Clinician Signature _______________________________________ Date __________  Please bring this form and your medication list with you to all your follow-up doctor's appointments.

## 2015-10-05 NOTE — Progress Notes (Signed)
Pt refuse NIV for the night, pt stated that she just wants to rest.

## 2015-10-05 NOTE — Progress Notes (Signed)
Physical Therapy Discharge Summary  Patient Details  Name: Dana Abbott MRN: [id number] Date of Birth: [birth year]  Today's Date: 10/05/2015 PT Individual Time: 9450-3888 PT Individual Time Calculation (min): 62 min    Patient has met 8 of 8 long term goals due to improved activity tolerance, improved balance, improved postural control, increased strength, decreased pain, ability to compensate for deficits and improved awareness.  Patient to discharge at an ambulatory level Supervision.     Reasons goals not met: N/A  Recommendation:  Patient will benefit from ongoing skilled PT services in home health setting to continue to advance safe functional mobility, address ongoing impairments in decreased activity tolerance, reduced cardiovascular endurance, decreased dynamic standing balance,, and minimize fall risk.  Equipment: Supplemental oxygen  Reasons for discharge: treatment goals met  Patient/family agrees with progress made and goals achieved: Yes  Skilled PT treatment: Pt received in recliner & agreeable to PT, noting 2/10 ache in LLE. Pt able to demonstrate stair negotiation, car transfers (pt unable to transfer BLE in/out of car 2/2 body habitus), stand pivot transfers, ambulation over uneven surfaces with rollator, and bed mobility with supervision A. Pt to d/c home at supervision level of assistance and will require supplemental oxygen as pt desaturates with physical exertion. During session pt was able to tolerate standing & folding pillowcases & place them on simulated shelf with supervision A for endurance training. Pt voices no concerns regarding d/c home and reports she feels comfortable managing oxygen tubing as she has done this in the past and demonstrated during AM session. Pt has made many modifications to her home, specific to her ADL's and has methods she prefers to use to complete tasks.  At end of session pt left sitting in w/c with family present to supervise.  PT  Discharge Precautions/Restrictions Restrictions Weight Bearing Restrictions: No  Pain Pain Assessment Pain Assessment: 0-10 Pain Score: 2  Pain Location: Knee Pain Orientation: Left Pain Descriptors / Indicators: Aching Pain Intervention(s): Repositioned;Ambulation/increased activity   Vision/Perception  Pt wears glasses for distance only at baseline.  Pt notes no change in vision while in CIR.  Cognition Overall Cognitive Status: Within Functional Limits for tasks assessed Arousal/Alertness: Awake/alert Orientation Level: Oriented X4;Oriented to person;Oriented to place;Oriented to time;Oriented to situation Attention: Focused Focused Attention: Appears intact Memory: Appears intact Awareness: Appears intact Problem Solving: Appears intact Safety/Judgment: Appears intact  Sensation Sensation Light Touch: Appears Intact (BLE) Proprioception: Appears Intact (BLE) Coordination Gross Motor Movements are Fluid and Coordinated: Yes Fine Motor Movements are Fluid and Coordinated: Yes  Motor  Motor Motor:  (improvements in BLE strength)   Mobility Bed Mobility Bed Mobility: Supine to Sit;Sit to Supine Supine to Sit: 5: Supervision (step stool) Sit to Supine: 5: Supervision (with step stool) Transfers Transfers: Yes Sit to Stand: 5: Supervision Stand Pivot Transfers: 5: Supervision  Locomotion  Ambulation Ambulation: Yes Ambulation/Gait Assistance: 5: Supervision Ambulation Distance (Feet): 80 Feet (maximum throughout therapy; 40 ft on this date) Assistive device: Rollator Gait Gait: Yes Gait Pattern: Decreased step length - left;Decreased step length - right (decreased gait speed) Stairs / Additional Locomotion Stairs: Yes Stairs Assistance: 5: Supervision Stair Management Technique: Two rails Number of Stairs: 4 Height of Stairs: 6 (inches) Wheelchair Mobility Wheelchair Mobility: Yes Wheelchair Assistance: 5: Supervision (while on straight path  only) Wheelchair Assistance Details: Verbal cues for sequencing;Verbal cues for Marketing executive: Both upper extremities Wheelchair Parts Management: Needs assistance Distance: 5 ft (on this date)    See Function Navigator for  Current Functional Status.  Waunita Schooner 10/05/2015, 2:50 PM

## 2015-10-05 NOTE — Progress Notes (Signed)
Entered patient room to administer evening medications. Patient noted to be bathroom. Patient stated she "took herself." Patient refuses any staff assistance despite education on fall risk.

## 2015-10-05 NOTE — Progress Notes (Signed)
Occupational Therapy Discharge Summary  Patient Details  Name: Dana Abbott MRN: [id number] Date of Birth: 13-Dec-1965  Patient has met 7 of 8 long term goals due to improved activity tolerance, improved balance, ability to compensate for deficits and improved awareness.  Patient to discharge at overall Supervision level with mobility and Setup-Mod I level with bathing and dressing.  Patient's care partner is independent to provide the necessary assistance at discharge.  No formal family education completed due to pt request to d/c home prior to original planned d/c and no family present on final day of therapy, however pt is able to direct her care and reports her son and daughter alternate work schedules so someone is always home with her.  Reasons goals not met: Pt requires supervision/setup for LB dressing, especially assist with donning socks.  Recommendation:  Patient will benefit from ongoing skilled OT services in home health setting to continue to advance functional skills in the area of BADL, iADL and Reduce care partner burden.  Equipment: No equipment provided  Reasons for discharge: treatment goals met and discharge from hospital  Patient/family agrees with progress made and goals achieved: Yes  OT Discharge Precautions/Restrictions  Precautions Precautions: Fall Precaution Comments: limited by R knee pain General   Vital Signs Therapy Vitals Temp: 98.5 F (36.9 C) Temp Source: Oral Pulse Rate: 78 Resp: 18 BP: 114/65 mmHg Patient Position (if appropriate): Sitting Oxygen Therapy SpO2: 98 % O2 Device: CPAP Pain Pain Assessment Pain Assessment: No/denies pain ADL   See Functional Navigator Vision/Perception  Vision- History Baseline Vision/History: Wears glasses Wears Glasses: Distance only Patient Visual Report: No change from baseline Vision- Assessment Vision Assessment?: No apparent visual deficits  Cognition Overall Cognitive Status: Within  Functional Limits for tasks assessed Arousal/Alertness: Awake/alert Orientation Level: Oriented X4 Attention: Focused Focused Attention: Appears intact Memory: Appears intact Awareness: Appears intact Problem Solving: Appears intact Safety/Judgment: Appears intact Sensation Sensation Light Touch: Appears Intact Stereognosis: Appears Intact Hot/Cold: Appears Intact Proprioception: Appears Intact Coordination Gross Motor Movements are Fluid and Coordinated: Yes Fine Motor Movements are Fluid and Coordinated: Yes Motor  Motor Motor:  (improvements in BLE strength) Mobility  Bed Mobility Bed Mobility: Supine to Sit;Sit to Supine Supine to Sit: 5: Supervision (step stool) Sit to Supine: 5: Supervision (with step stool) Transfers Sit to Stand: 5: Supervision  Extremity/Trunk Assessment RUE Assessment RUE Assessment: Within Functional Limits LUE Assessment LUE Assessment: Within Functional Limits   See Function Navigator for Current Functional Status.  Simonne Come 10/06/2015, 7:46 AM

## 2015-10-05 NOTE — Progress Notes (Signed)
Physical Therapy Session Note  Patient Details  Name: Dana Abbott MRN: [id number] Date of Birth: 1965-11-21  Today's Date: 10/05/2015 PT Individual Time: 4585-9292  PT Individual Time Calculation (min): 62 min   Short Term Goals: Week 1:  PT Short Term Goal 1 (Week 1): Pt will complete bed mobility in regular bed with Min A. PT Short Term Goal 2 (Week 1): Pt will ambulate 20 ft with LRAD & min A.  Skilled Therapeutic Interventions/Progress Updates:  Pt received in recliner & agreeable to PT treatment. Pt attempted to order lunch but becoming frustrated with heart healthy diet. PT educated pt on heart healthy diet & purpose of it while in hospital, and importance of carrying diet over after d/c home but pt reports she does not plan to do so.  Pt reports her daughter has provided intermittent LE assistance for transferring sit>supine prior to admission. Pt ambulated recliner>bed with rollator & managing oxygen tubing with supervision, getting tubing underneath AD but able to correct this once sitting on EOB. Practiced bed mobility in hospital bed but without bed features & use of step stool as pt uses one at home. Pt able to transfer supine<>sit in this manner with supervision & significantly increased time. Pt requires multiple rest breaks during task due to fatigue & SOB. Gait training x 40 ft + 40 ft with Rollator & supervision with PT managing oxygen tank. At end of session pt left in recliner with all needs within reach. Pt reports being limited by LLE swelling on this date making it difficult to advance/move limb.   Therapy Documentation Precautions:  Precautions Precautions: Fall Precaution Comments: limited by R knee pain Restrictions Weight Bearing Restrictions: No  Pain: Pain Assessment Pain Assessment: No/denies pain    See Function Navigator for Current Functional Status.   Therapy/Group: Individual Therapy  Sandi Mariscal 10/05/2015, 8:01 AM

## 2015-10-05 NOTE — Progress Notes (Signed)
SATURATION QUALIFICATIONS: (This note is used to comply with regulatory documentation for home oxygen)  Patient Saturations on Room Air at Rest = 86%  Patient Saturations on Room Air while Ambulating = 71%  Patient Saturations on 2-3  Liters of oxygen while Ambulating = 88%  Please briefly explain why patient needs home oxygen:Pt has CHF and CAD she is also severely obese and requires O2 to maintain 88-95% saturation.

## 2015-10-05 NOTE — Progress Notes (Signed)
Occupational Therapy Session Note  Patient Details  Name: Dana Abbott MRN: [id number] Date of Birth: 04/21/1966  Today's Date: 10/05/2015 OT Individual Time: 2458-0998 OT Individual Time Calculation (min): 58 min    Short Term Goals: Week 1: OT Short Term Goal 1 (Week 1): Pt will be able to tolerate standing for at least 5 minutes to enable her to do light house keeping.  OT Short Term Goal 2 (Week 1): Pt will be able to transfer into bathtub with min A using tub bench. OT Short Term Goal 3 (Week 1): Pt will be able to don socks or shoes without A using AE if needed.  Skilled Therapeutic Interventions/Progress Updates:    Treatment session with focus on improved safety with self-care tasks and bathroom transfers.  Pt requested increased privacy with bathing in room shower, stating "don't come in here".  Required assist to set up shower with all necessary items and then pt able to direct her care by requesting that clinician hand her the towels and clothing upon request.  Pt overall distant supervision- Mod I during bathing and dressing at shower level.  Pt reports no concerns for self-care tasks, reports that her son bought a tub bench for her shower and she feels comfortable with that transfer.  Therapy Documentation Precautions:  Precautions Precautions: Fall Precaution Comments: limited by R knee pain Restrictions Weight Bearing Restrictions: No General:   Vital Signs:   Pain: Pain Assessment Pain Assessment: No/denies pain ADL:   Exercises:   Other Treatments:    See Function Navigator for Current Functional Status.   Therapy/Group: Individual Therapy  Leafy Kindle 10/05/2015, 10:09 AM

## 2015-10-06 ENCOUNTER — Inpatient Hospital Stay (HOSPITAL_COMMUNITY): Payer: PRIVATE HEALTH INSURANCE | Admitting: Occupational Therapy

## 2015-10-06 DIAGNOSIS — R0609 Other forms of dyspnea: Secondary | ICD-10-CM

## 2015-10-06 DIAGNOSIS — I5033 Acute on chronic diastolic (congestive) heart failure: Secondary | ICD-10-CM | POA: Diagnosis not present

## 2015-10-06 DIAGNOSIS — R5381 Other malaise: Secondary | ICD-10-CM | POA: Diagnosis not present

## 2015-10-06 DIAGNOSIS — R0902 Hypoxemia: Secondary | ICD-10-CM

## 2015-10-06 DIAGNOSIS — R269 Unspecified abnormalities of gait and mobility: Secondary | ICD-10-CM | POA: Diagnosis not present

## 2015-10-06 MED ORDER — POTASSIUM CHLORIDE CRYS ER 20 MEQ PO TBCR: 20.0000 meq | EXTENDED_RELEASE_TABLET | Freq: Every day | ORAL | Status: DC

## 2015-10-06 MED ORDER — ASPIRIN 81 MG PO TBEC: 81.0000 mg | DELAYED_RELEASE_TABLET | Freq: Every day | ORAL | Status: DC

## 2015-10-06 MED ORDER — VITAMIN B-12 100 MCG PO TABS: 100.0000 ug | ORAL_TABLET | Freq: Two times a day (BID) | ORAL | Status: DC

## 2015-10-06 MED ORDER — FUROSEMIDE 40 MG PO TABS: 40.0000 mg | ORAL_TABLET | Freq: Two times a day (BID) | ORAL | Status: DC

## 2015-10-06 MED ORDER — CHOLECALCIFEROL 250 MCG (10000 UT) PO TABS: 10000.0000 [IU] | ORAL_TABLET | Freq: Every day | ORAL | Status: DC

## 2015-10-06 MED ORDER — FERROUS SULFATE 325 (65 FE) MG PO TABS: 325.0000 mg | ORAL_TABLET | Freq: Three times a day (TID) | ORAL | Status: DC

## 2015-10-06 MED ORDER — CYANOCOBALAMIN 1000 MCG/ML IJ SOLN
1000.0000 ug | INTRAMUSCULAR | Status: DC
  Administered 2015-10-06: 1000 ug via INTRAMUSCULAR
  Filled 2015-10-06: qty 1

## 2015-10-06 MED ORDER — PROSIGHT PO TABS: 1.0000 | ORAL_TABLET | Freq: Every day | ORAL | Status: DC

## 2015-10-06 NOTE — Progress Notes (Signed)
Social Work  Discharge Note  The overall goal for the admission was met for:   Discharge location: [facility] SUPERVISION  Length of Stay: Yes-9 DAYS  Discharge activity level: Yes-SUPERVISION LEVEL  Home/community participation: Yes  Services provided included: MD, RD, PT, OT, RN, CM, TR, Pharmacy and SW  Financial Services: Other: MEDICAID PENDING  Follow-up services arranged: Home Health: [facility]-RN NOT ELGIBLE FOR ANY OTHER THERAPIES DUE TO MEDICAID AND DIAGNOSIS, DME: [facility]-HOME O2 and Patient/Family has no preference for HH/DME agencies  Comments (or additional information):PT REACHED GOALS SOONER AND WANTED TO [place]. [facility]-APPOINT[facility] 7/11 @ 11:30 FOR MEDICAL FOLLOW UP. PT TO FOLLOW UP WITH DISABILITY AND MEDICAID APPLICATIONS. MATCH PROGRAM GIVEN TO PT TO ASSIST WITH MEDICATIONS.  Patient/Family verbalized understanding of follow-up arrangements: Home Health: [facility] NOT ELIGIBLE FOR THERAPIES DUE TO DIAGNOSIS, DME: [facility] and Patient/Family has no preference for HH/DME agencies  Individual responsible for coordination of the follow-up plan: SELF & DAUGHTER  Confirmed correct DME delivered: Elease Hashimoto 10/06/2015    Elease Hashimoto

## 2015-10-06 NOTE — Progress Notes (Signed)
Patient discharged to home, accompanied by her son. 

## 2015-10-06 NOTE — Progress Notes (Signed)
Patient requests new bed linens and gown for incontinent episode following "sneezing." Patient also states she "needs respiratory" for cpap, despite earlier documented refusal.

## 2015-10-06 NOTE — Progress Notes (Signed)
Pt now states that she wants to use her NIV for the night. Pt was place on NIV tolerating it well.

## 2015-10-06 NOTE — Progress Notes (Signed)
[facility] PHYSICAL MEDICINE & REHABILITATION     PROGRESS NOTE  Subjective/Complaints:  Patient seen sitting up in her recliner. She states she is ready to go home.  ROS: Denies CP, SOB, N/V/D  Objective: Vital Signs: Blood pressure 114/65, pulse 78, temperature 98.5 F (36.9 C), temperature source Oral, resp. rate 18, weight 168.783 kg (372 lb 1.6 oz), last menstrual period 09/03/2015, SpO2 98 %. No results found.  Recent Labs  10/04/15 0533  WBC 3.3*  HGB 9.4*  HCT 35.0*  PLT 182    Recent Labs  10/04/15 0533  NA 139  K 3.9  CL 100*  GLUCOSE 93  BUN 6  CREATININE 0.50  CALCIUM 8.7*   CBG (last 3)  No results for input(s): GLUCAP in the last 72 hours.  Wt Readings from Last 3 Encounters:  10/06/15 168.783 kg (372 lb 1.6 oz)  09/27/15 175.451 kg (386 lb 12.8 oz)  08/28/15 166.47 kg (367 lb)    Physical Exam:  BP 114/65 mmHg  Pulse 78  Temp(Src) 98.5 F (36.9 C) (Oral)  Resp 18  Wt 168.783 kg (372 lb 1.6 oz)  SpO2 98%  LMP 09/03/2015 (Approximate) Constitutional: She appears well-developed. NAD. Morbidly obese. HENT: Normocephalic and atraumatic. + [place] Eyes: Conjunctivae and EOM are normal.  Cardiovascular: Normal rate and regular rhythm. No murmur heard. Respiratory: Effort normal. No stridor. No respiratory distress. She has no wheezes.  GI: Soft. Bowel sounds are normal. She exhibits no distension. There is no tenderness.  Musculoskeletal: She exhibits edema and tenderness. Wrists swollen also Pedal edema bilaterally.1++   Neurological: She is alert and oriented.  Motor: B/l UE: 5/5 proximal to distal RLE: Hip flexion 4+/5, knee extension 4 +/5, ankle dorsi/plantar flexion 5/5 LLE: Hip flexion, knee extension 4 +/5, ankle doris/plantar flexion 5/5  Skin: Skin is warm and dry. No rash noted. No erythema.  Psychiatric: She has a normal mood and affect. Her behavior is normal. Judgment and thought content normal.    Assessment/Plan: 1.  Functional deficits secondary to deconditioning which require 3+ hours per day of interdisciplinary therapy in a comprehensive inpatient rehab setting. Physiatrist is providing close team supervision and 24 hour management of active medical problems listed below. Physiatrist and rehab team continue to assess barriers to discharge/monitor patient progress toward functional and medical goals.  Function:  Bathing Bathing position   Position: Shower  Bathing parts Body parts bathed by patient: Right arm, Left arm, Chest, Abdomen, Front perineal area, Buttocks, Right upper leg, Left upper leg, Right lower leg, Left lower leg, Back    Bathing assist Assist Level: Set up   Set up : To obtain items  Upper Body Dressing/Undressing Upper body dressing   What is the patient wearing?: Pull over shirt/dress, Bra Bra - Perfomed by patient: Thread/unthread right bra strap, Thread/unthread left bra strap, Hook/unhook bra (pull down sports bra)   Pull over shirt/dress - Perfomed by patient: Thread/unthread right sleeve, Thread/unthread left sleeve, Put head through opening, Pull shirt over trunk          Upper body assist Assist Level: Set up   Set up : To obtain clothing/put away  Lower Body Dressing/Undressing Lower body dressing   What is the patient wearing?: Non-skid slipper socks, Pants (skirt)     Pants- Performed by patient: Thread/unthread right pants leg, Thread/unthread left pants leg, Pull pants up/down   Non-skid slipper socks- Performed by patient: Don/doff right sock, Don/doff left sock Non-skid slipper socks- Performed by helper:  Don/doff right sock, Don/doff left sock                  Lower body assist Assist for lower body dressing: Supervision or verbal cues      Toileting Toileting   Toileting steps completed by patient: Adjust clothing prior to toileting, Performs perineal hygiene, Adjust clothing after toileting Toileting steps completed by helper: Adjust  clothing after toileting Toileting Assistive Devices: Grab bar or rail  Toileting assist Assist level: Supervision or verbal cues   Transfers Chair/bed transfer   Chair/bed transfer method: Stand pivot Chair/bed transfer assist level: Supervision or verbal cues Chair/bed transfer assistive device: Patent attorney     Max distance: 12 ft Assist level: Supervision or verbal cues   Wheelchair Wheelchair activity did not occur: Safety/medical concerns Type: Manual Max wheelchair distance: 5 ft Assist Level: Supervision or verbal cues  Cognition Comprehension Comprehension assist level: Follows complex conversation/direction with no assist  Expression Expression assist level: Expresses complex ideas: With no assist  Social Interaction Social Interaction assist level: Interacts appropriately with others with medication or extra time (anti-anxiety, antidepressant).  Problem Solving Problem solving assist level: Solves complex problems: Recognizes & self-corrects  Memory Memory assist level: Recognizes or recalls 90% of the time/requires cueing < 10% of the time    Medical Problem List and Plan: 1. RLE weakness, abnormality of gait secondary to deconditioning.   -DC today 2. DVT Prophylaxis/Anticoagulation: Pharmaceutical: Lovenox 3. Pain Management: Mobic daily. Added sportscreme as well as ice for local measures.  4. Mood: LCSW to follow for evaluation and support.  5. Neuropsych: This patient is capable of making decisions on her own behalf. 6. Skin/Wound Care: routine pressure relief measures.  7. Fluids/Electrolytes/Nutrition:     -BMP stable on 6/12 8. Acute on chronic diastolic CHF with acute hypoxic respiratory failure:    -weights appear to be trending down  -had EF of 55% on recent echo Filed Weights   10/05/15 0602 10/06/15 0500 10/06/15 0821  Weight: 172 kg (379 lb 3.1 oz) 167.7 kg (369 lb 11.4 oz) 168.783 kg (372 lb 1.6 oz)   -continue lasix  40mg  bid. 9. Non-obstructive CAD: Low dose ASA daily. 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 B 12 and iron supplement. Educate patient on importance of compliance after discharge. 11. Vitamin D deficiency: On supplement bid with follow up recommended on 08/01. 12. OHS/OSA: BIPAP at nights and whenever asleep.  13. Hypotension: Due to diuresis. Monitor for symptoms as well as renal status for AKI.  14. Severe Obesity: Heart healthy diet-- added low salt diet. Will have dietician follow up closer to discharge to see if patient willing to modify diet after discharge.  15. Anemia  Hb 9.4 on 6/12 gradually improving  Will cont to monitor  LOS (Days) 9 A FACE TO FACE EVALUATION WAS PERFORMED  Ankit Karis Juba 10/06/2015 8:57 AM

## 2015-10-07 ENCOUNTER — Telehealth: Payer: Self-pay | Admitting: *Deleted

## 2015-10-07 NOTE — Telephone Encounter (Signed)
Contacted patient and confirmed follow up appointment 10/14/2015 at 10:40am  Transitional Care Questions  Questions for our staff to ask patients on Transitional care 48 hour phone call:   1. Are you/is patient experiencing any problems since coming home? No Are there any questions regarding any aspect of care? No   2. Are there any questions regarding medications administration/dosing? No  Are meds being taken as prescribed? Yes Patient should review meds with caller to confirm   3. Have there been any falls? No  4. Has Home Health been to the house and/or have they contacted you? They dropped off oxygen, will contact soon to setup [facility] nurse visits.  If not, have you tried to contact them? Can we help you contact them?   5. Are bowels and bladder emptying properly? No Are there any unexpected incontinence issues? No If applicable, is patient following bowel/bladder programs?  6. Any fevers, problems with breathing, unexpected pain? No   7. Are there any skin problems or new areas of breakdown? No  8. Has the patient/family member arranged specialty MD follow up (ie cardiology/neurology/renal/surgical/etc)? Yes Can we help arrange?  No   9. Does the patient need any other services or support that we can help arrange? No  10. Are caregivers following through as expected in assisting the patient? Yes   11. Has the patient quit smoking, drinking alcohol, or using drugs as recommended? Never smoker, drinker, or illicit drug use    12.

## 2015-10-14 ENCOUNTER — Encounter
Payer: PRIVATE HEALTH INSURANCE | Attending: Physical Medicine & Rehabilitation | Admitting: Physical Medicine & Rehabilitation

## 2015-10-21 ENCOUNTER — Institutional Professional Consult (permissible substitution): Payer: PRIVATE HEALTH INSURANCE | Admitting: Neurology

## 2015-11-02 ENCOUNTER — Ambulatory Visit (INDEPENDENT_AMBULATORY_CARE_PROVIDER_SITE_OTHER): Payer: Self-pay | Admitting: Internal Medicine

## 2015-11-02 ENCOUNTER — Encounter: Payer: Self-pay | Admitting: Internal Medicine

## 2015-11-02 VITALS — BP 120/80 | HR 79 | Temp 98.4°F | Resp 20 | Ht <= 58 in | Wt 369.0 lb

## 2015-11-02 DIAGNOSIS — D518 Other vitamin B12 deficiency anemias: Secondary | ICD-10-CM

## 2015-11-02 DIAGNOSIS — N920 Excessive and frequent menstruation with regular cycle: Secondary | ICD-10-CM

## 2015-11-02 DIAGNOSIS — I509 Heart failure, unspecified: Secondary | ICD-10-CM

## 2015-11-02 DIAGNOSIS — I2510 Atherosclerotic heart disease of native coronary artery without angina pectoris: Secondary | ICD-10-CM

## 2015-11-02 DIAGNOSIS — I82409 Acute embolism and thrombosis of unspecified deep veins of unspecified lower extremity: Secondary | ICD-10-CM

## 2015-11-02 DIAGNOSIS — I5033 Acute on chronic diastolic (congestive) heart failure: Secondary | ICD-10-CM

## 2015-11-02 DIAGNOSIS — I213 ST elevation (STEMI) myocardial infarction of unspecified site: Secondary | ICD-10-CM

## 2015-11-02 DIAGNOSIS — G4733 Obstructive sleep apnea (adult) (pediatric): Secondary | ICD-10-CM

## 2015-11-02 DIAGNOSIS — M778 Other enthesopathies, not elsewhere classified: Secondary | ICD-10-CM

## 2015-11-02 DIAGNOSIS — D510 Vitamin B12 deficiency anemia due to intrinsic factor deficiency: Secondary | ICD-10-CM

## 2015-11-02 DIAGNOSIS — E538 Deficiency of other specified B group vitamins: Secondary | ICD-10-CM

## 2015-11-02 DIAGNOSIS — D500 Iron deficiency anemia secondary to blood loss (chronic): Secondary | ICD-10-CM

## 2015-11-02 DIAGNOSIS — M1990 Unspecified osteoarthritis, unspecified site: Secondary | ICD-10-CM

## 2015-11-02 DIAGNOSIS — M170 Bilateral primary osteoarthritis of knee: Secondary | ICD-10-CM

## 2015-11-02 DIAGNOSIS — I6330 Cerebral infarction due to thrombosis of unspecified cerebral artery: Secondary | ICD-10-CM

## 2015-11-02 MED ORDER — CHOLECALCIFEROL 250 MCG (10000 UT) PO TABS: 10000.0000 [IU] | ORAL_TABLET | Freq: Every day | ORAL | Status: DC

## 2015-11-02 MED ORDER — MELOXICAM 15 MG PO TABS: 15.0000 mg | ORAL_TABLET | Freq: Every day | ORAL | Status: DC

## 2015-11-02 NOTE — Patient Instructions (Signed)
Get orange card and let us know when you have it.

## 2015-11-02 NOTE — Progress Notes (Signed)
Subjective:    Patient ID: Dana Abbott, female    DOB: 07/28/1965, 50 y.o.   MRN: [id number]  HPI   1.  Morbid Obesity:  All her life.  In 2003, became quite ill with CHF, diagnosed with OSA and started on CPAP, Hypertension, hypoxia and in the same year, developed an MI (after which she actually developed CHF) and a stroke.  Stroke, the symptoms of which she does not recall, other than requiring speech therapy subsequently. In 2004, underwent gastric bypass surgery, removing part of stomach, gallbladder (she does not recognize Roux en Y).  Surgery was in [place], [place].   Went from 499 lbs to 299 lbs over the period of 9 months.  Utlimately, was able to get off supplemental O2, CPAP and bp meds, Lovenox and coumadin to prevent recurrent DVTs in legs, diuretics. Ultimately, down to 200 lbs [place]oved to [place] in 2010 and started at [facility] with a "Dr. Maxine Glenn" Did well until this year.  Gained weight gradually and weighed above 300 lbs for first time this year.  Weighed 399 lbs when admitted to [facility] May 31st.  Thinks she weighed 368 lbs after diuresis in the hospital.  Was diagnosed with acute on chronic CHF when admitted.    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 Pt. Did have insurance through her job, but was let go as inspections at her place of work told her superiors that they needed to make the job handicapped accessible as she uses a walker. Patient currently living in a shelter on [address] with her 68 yo daughter and looking for a place to live.  They are following a low sodium diet and trying to limit calorie intake as well.  1.  CHF:  Due to CAD and OSA likely, though appears mainly the latter with right heart failure.   [facility] supplies her home oxygen- 2L per nasal cannula.  Does not have that paperwork with her.    2.  OSA  She needs a sleep study to get restarted on CPAP.  3.  Pernicious Anemia:   Does have pernicious anemia  and received her 14th Vitamin B12 shot just before discharge.  Has been treated for pernicious anemia, it sounds like, since 2011:  Does not have instrinsic factor by her records from [facility]. She is not certain what her dose was in the hospital or what she was treated with before with [facility].  Her physician left from [facility] and was with [facility] in [facility] near [facility] starting in 2016.  When lost insurance, could no longer go to [place].  4.  OA of bilateral knees:  Takes Meloxicam and did not get this filled when discharged.  States was even taking this in rehab up until discharge.  She denies melena or hematochezia  Her blood loss anemia likely from heavy periods, the last one ending last Friday.   Current outpatient prescriptions:  .  acetaminophen (TYLENOL) 325 MG tablet, Take 2 tablets (650 mg total) by mouth every 6 (six) hours as needed for mild pain., Disp: , Rfl:  .  aspirin 81 MG chewable tablet, Chew 162 mg by mouth once., Disp: , Rfl:  .  aspirin EC 81 MG EC tablet, Take 1 tablet (81 mg total) by mouth daily., Disp: , Rfl:  .  ferrous sulfate 325 (65 FE) MG tablet, Take 1 tablet (325 mg total) by mouth 3 (three) times daily with meals., Disp:  90 tablet, Rfl: 0 .  furosemide (LASIX) 40 MG tablet, Take 1 tablet (40 mg total) by mouth 2 (two) times daily., Disp: 60 tablet, Rfl: 0 .  Menthol-Methyl Salicylate (MUSCLE RUB) 10-15 % CREA, Apply 1 application topically 4 (four) times daily -  with meals and at bedtime., Disp: , Rfl: 0 .  multivitamin (PROSIGHT) TABS tablet, Take 1 tablet by mouth daily., Disp: 30 each, Rfl: 0 .  multivitamin (PROSIGHT) TABS tablet, Take 1 tablet by mouth daily., Disp: 100 each, Rfl: 0 .  potassium chloride SA (K-DUR,KLOR-CON) 20 MEQ tablet, Take 1 tablet (20 mEq total) by mouth daily., Disp: 30 tablet, Rfl: 0 .  Cholecalciferol 10000 units TABS, Take 10,000 Units by mouth daily. (Patient not taking: Reported on 11/02/2015),  Disp: 60 tablet, Rfl: 0 .  docusate sodium (COLACE) 100 MG capsule, Take 1 capsule (100 mg total) by mouth 2 (two) times daily. (Patient not taking: Reported on 08/28/2015), Disp: 10 capsule, Rfl: 0 .  meloxicam (MOBIC) 15 MG tablet, Take 1 tablet (15 mg total) by mouth daily. (Patient not taking: Reported on 11/02/2015), Disp: 30 tablet, Rfl: 0 .  vitamin B-12 (CYANOCOBALAMIN) 100 MCG tablet, Take 1 tablet (100 mcg total) by mouth 2 (two) times daily. (Patient not taking: Reported on 11/02/2015), Disp: 60 tablet, Rfl: 0   Allergies  Allergen Reactions  . Shellfish Allergy     Rash     Past Medical History  Diagnosis Date  . CHF (congestive heart failure) (HCC)   . Anemia   . DVT (deep venous thrombosis) (HCC) "after my heart attack"    "one of my legs"  . Heavy menses   . Myocardial infarction [facility]) 2003?  Marland Kitchen History of blood transfusion "I've had alot"    "I'm anemic"  . Arthritis     "knees" (07/31/2015)    Past Surgical History  Procedure Laterality Date  . Cesarean section  1985; 1991; 1993  . Gastric bypass  2004  . Tonsillectomy    . Carpal tunnel release Right   . Cholecystectomy  2004    w/gastric OR   Social History   Social History  . Marital Status: Single    Spouse Name: N/A  . Number of Children: N/A  . Years of Education: N/A   Occupational History  . Not on file.   Social History Main Topics  . Smoking status: Former Smoker -- 1.00 packs/day for 2 years    Types: Cigarettes    Start date: 04/24/1988    Quit date: 04/24/1991  . Smokeless tobacco: Never Used  . Alcohol Use: No     Comment: "I haven't drank in 20 years"  . Drug Use: No  . Sexual Activity: Not Currently   Other Topics Concern  . Not on file   Social History Narrative   Family History  Problem Relation Age of Onset  . Stroke Mother   . Hypertension Mother   . COPD Father   . Cancer Neg Hx   . Diabetes type II Daughter      Review of Systems     Objective:   Physical Exam    [place] obese. Very difficult ambulating with her weight--uses walker. HEENT:  PERRL, EOMI, TMs pearly gray, throat without injection. Neck:  Supple, no adenopathy Chest:  CTA CV:  RRR with normal S1 and S2, No S3, S4 or murmur, carotid, radial pulses normal and equal.  Legs and feet obese, difficult to palpate DP and PT pulses.  Not clear has much fluid in LE, mainly obese.       Assessment & Plan:  1.  Morbid Obesity:  Will have difficulty staying on a healthy diet while living in shelter.  Referral to LCSW to see if can help support patient and daughter in getting better housing.  2.  OSA:  Needs split night sleep study to get back on CPAP/Bipap:  Referral made.  Discussed will need to apply for financial assistance through [facility].  Paperwork given.    3.  Pernicious Anemia/menstrual blood loss anemia:  Check CBC.  Order out for Vitamin B12 for this patient.  4.  Bilateral knee pain:  Mobic 15 mg daily, check CMP for kidney and liver studies.  Send for records from [place] if possible. Follow up in 4 weeks for B12 shot and general followup.

## 2015-11-03 LAB — COMPREHENSIVE METABOLIC PANEL
ALBUMIN: 3.6 g/dL (ref 3.5–5.5)
ALT: 11 IU/L (ref 0–32)
AST: 16 IU/L (ref 0–40)
Albumin/Globulin Ratio: 0.9 — ABNORMAL LOW (ref 1.2–2.2)
Alkaline Phosphatase: 80 IU/L (ref 39–117)
BUN / CREAT RATIO: 16 (ref 9–23)
BUN: 7 mg/dL (ref 6–24)
Bilirubin Total: 0.3 mg/dL (ref 0.0–1.2)
CALCIUM: 9 mg/dL (ref 8.7–10.2)
CO2: 26 mmol/L (ref 18–29)
CREATININE: 0.44 mg/dL — AB (ref 0.57–1.00)
Chloride: 97 mmol/L (ref 96–106)
GFR calc non Af Amer: 121 mL/min/{1.73_m2} (ref >59)
GFR, EST AFRICAN AMERICAN: 139 mL/min/{1.73_m2} (ref >59)
GLUCOSE: 92 mg/dL (ref 65–99)
Globulin, Total: 3.9 g/dL (ref 1.5–4.5)
Potassium: 4.3 mmol/L (ref 3.5–5.2)
Sodium: 141 mmol/L (ref 134–144)
TOTAL PROTEIN: 7.5 g/dL (ref 6.0–8.5)

## 2015-11-03 LAB — CBC WITH DIFFERENTIAL/PLATELET
Basophils Absolute: 0 10*3/uL (ref 0.0–0.2)
Basos: 0 %
EOS (ABSOLUTE): 0.1 10*3/uL (ref 0.0–0.4)
EOS: 3 %
HEMATOCRIT: 37.4 % (ref 34.0–46.6)
HEMOGLOBIN: 11.4 g/dL (ref 11.1–15.9)
IMMATURE GRANS (ABS): 0 10*3/uL (ref 0.0–0.1)
Immature Granulocytes: 0 %
LYMPHS ABS: 1.1 10*3/uL (ref 0.7–3.1)
LYMPHS: 22 %
MCH: 25.1 pg — AB (ref 26.6–33.0)
MCHC: 30.5 g/dL — AB (ref 31.5–35.7)
MCV: 82 fL (ref 79–97)
MONOCYTES: 6 %
Monocytes Absolute: 0.3 10*3/uL (ref 0.1–0.9)
NEUTROS ABS: 3.3 10*3/uL (ref 1.4–7.0)
Neutrophils: 69 %
Platelets: 216 10*3/uL (ref 150–379)
RBC: 4.55 x10E6/uL (ref 3.77–5.28)
RDW: 20.6 % — ABNORMAL HIGH (ref 12.3–15.4)
WBC: 4.7 10*3/uL (ref 3.4–10.8)

## 2015-11-18 ENCOUNTER — Ambulatory Visit (HOSPITAL_BASED_OUTPATIENT_CLINIC_OR_DEPARTMENT_OTHER): Payer: PRIVATE HEALTH INSURANCE | Attending: Internal Medicine

## 2015-11-22 ENCOUNTER — Ambulatory Visit: Payer: Self-pay

## 2015-12-03 ENCOUNTER — Ambulatory Visit: Payer: Self-pay | Admitting: Internal Medicine

## 2015-12-07 ENCOUNTER — Ambulatory Visit: Payer: Self-pay | Admitting: Internal Medicine

## 2015-12-22 ENCOUNTER — Institutional Professional Consult (permissible substitution): Payer: PRIVATE HEALTH INSURANCE | Admitting: Pulmonary Disease

## 2015-12-27 ENCOUNTER — Encounter: Payer: Self-pay | Admitting: Internal Medicine

## 2015-12-27 DIAGNOSIS — I509 Heart failure, unspecified: Secondary | ICD-10-CM | POA: Insufficient documentation

## 2015-12-27 DIAGNOSIS — I82409 Acute embolism and thrombosis of unspecified deep veins of unspecified lower extremity: Secondary | ICD-10-CM | POA: Insufficient documentation

## 2015-12-27 DIAGNOSIS — D649 Anemia, unspecified: Secondary | ICD-10-CM | POA: Insufficient documentation

## 2015-12-27 DIAGNOSIS — M1990 Unspecified osteoarthritis, unspecified site: Secondary | ICD-10-CM | POA: Insufficient documentation

## 2015-12-27 DIAGNOSIS — I219 Acute myocardial infarction, unspecified: Secondary | ICD-10-CM | POA: Insufficient documentation

## 2015-12-27 DIAGNOSIS — I639 Cerebral infarction, unspecified: Secondary | ICD-10-CM | POA: Insufficient documentation

## 2015-12-27 DIAGNOSIS — N920 Excessive and frequent menstruation with regular cycle: Secondary | ICD-10-CM | POA: Insufficient documentation

## 2017-08-03 ENCOUNTER — Inpatient Hospital Stay (HOSPITAL_COMMUNITY)
Admission: EM | Admit: 2017-08-03 | Discharge: 2017-08-09 | DRG: 286 | Disposition: A | Discharge status: Rehabilitation Facility | Payer: Medicaid Other | Attending: Internal Medicine | Admitting: Internal Medicine

## 2017-08-03 ENCOUNTER — Encounter (HOSPITAL_COMMUNITY): Payer: Self-pay | Admitting: Emergency Medicine

## 2017-08-03 ENCOUNTER — Emergency Department (HOSPITAL_COMMUNITY): Payer: Medicaid Other

## 2017-08-03 ENCOUNTER — Other Ambulatory Visit: Payer: Self-pay

## 2017-08-03 DIAGNOSIS — D649 Anemia, unspecified: Secondary | ICD-10-CM | POA: Diagnosis present

## 2017-08-03 DIAGNOSIS — Z791 Long term (current) use of non-steroidal anti-inflammatories (NSAID): Secondary | ICD-10-CM

## 2017-08-03 DIAGNOSIS — Z86718 Personal history of other venous thrombosis and embolism: Secondary | ICD-10-CM

## 2017-08-03 DIAGNOSIS — Z91128 Patient's intentional underdosing of medication regimen for other reason: Secondary | ICD-10-CM

## 2017-08-03 DIAGNOSIS — Z9884 Bariatric surgery status: Secondary | ICD-10-CM

## 2017-08-03 DIAGNOSIS — Z8249 Family history of ischemic heart disease and other diseases of the circulatory system: Secondary | ICD-10-CM

## 2017-08-03 DIAGNOSIS — R072 Precordial pain: Secondary | ICD-10-CM

## 2017-08-03 DIAGNOSIS — Z6841 Body Mass Index (BMI) 40.0 and over, adult: Secondary | ICD-10-CM

## 2017-08-03 DIAGNOSIS — I272 Pulmonary hypertension, unspecified: Secondary | ICD-10-CM | POA: Diagnosis present

## 2017-08-03 DIAGNOSIS — Z87891 Personal history of nicotine dependence: Secondary | ICD-10-CM

## 2017-08-03 DIAGNOSIS — I252 Old myocardial infarction: Secondary | ICD-10-CM

## 2017-08-03 DIAGNOSIS — Z8673 Personal history of transient ischemic attack (TIA), and cerebral infarction without residual deficits: Secondary | ICD-10-CM

## 2017-08-03 DIAGNOSIS — R52 Pain, unspecified: Secondary | ICD-10-CM

## 2017-08-03 DIAGNOSIS — M1711 Unilateral primary osteoarthritis, right knee: Secondary | ICD-10-CM | POA: Diagnosis present

## 2017-08-03 DIAGNOSIS — I2721 Secondary pulmonary arterial hypertension: Secondary | ICD-10-CM

## 2017-08-03 DIAGNOSIS — J9621 Acute and chronic respiratory failure with hypoxia: Secondary | ICD-10-CM | POA: Diagnosis present

## 2017-08-03 DIAGNOSIS — I5033 Acute on chronic diastolic (congestive) heart failure: Principal | ICD-10-CM | POA: Diagnosis present

## 2017-08-03 DIAGNOSIS — Z823 Family history of stroke: Secondary | ICD-10-CM

## 2017-08-03 DIAGNOSIS — E662 Morbid (severe) obesity with alveolar hypoventilation: Secondary | ICD-10-CM | POA: Diagnosis present

## 2017-08-03 DIAGNOSIS — N920 Excessive and frequent menstruation with regular cycle: Secondary | ICD-10-CM | POA: Diagnosis present

## 2017-08-03 DIAGNOSIS — J189 Pneumonia, unspecified organism: Secondary | ICD-10-CM

## 2017-08-03 DIAGNOSIS — T454X6A Underdosing of iron and its compounds, initial encounter: Secondary | ICD-10-CM | POA: Diagnosis present

## 2017-08-03 DIAGNOSIS — R5381 Other malaise: Secondary | ICD-10-CM | POA: Diagnosis present

## 2017-08-03 DIAGNOSIS — Z91013 Allergy to seafood: Secondary | ICD-10-CM

## 2017-08-03 DIAGNOSIS — D509 Iron deficiency anemia, unspecified: Secondary | ICD-10-CM | POA: Diagnosis present

## 2017-08-03 DIAGNOSIS — Z7982 Long term (current) use of aspirin: Secondary | ICD-10-CM

## 2017-08-03 DIAGNOSIS — J181 Lobar pneumonia, unspecified organism: Secondary | ICD-10-CM | POA: Diagnosis present

## 2017-08-03 DIAGNOSIS — D510 Vitamin B12 deficiency anemia due to intrinsic factor deficiency: Secondary | ICD-10-CM | POA: Diagnosis present

## 2017-08-03 LAB — BASIC METABOLIC PANEL
Anion gap: 8 (ref 5–15)
BUN: 8 mg/dL (ref 6–20)
CALCIUM: 8.1 mg/dL — AB (ref 8.9–10.3)
CO2: 28 mmol/L (ref 22–32)
Chloride: 104 mmol/L (ref 101–111)
Creatinine, Ser: 0.5 mg/dL (ref 0.44–1.00)
GFR calc Af Amer: >60 mL/min (ref >60)
Glucose, Bld: 98 mg/dL (ref 65–99)
POTASSIUM: 4.2 mmol/L (ref 3.5–5.1)
SODIUM: 140 mmol/L (ref 135–145)

## 2017-08-03 LAB — HEPATIC FUNCTION PANEL
ALT: 12 U/L — ABNORMAL LOW (ref 14–54)
AST: 19 U/L (ref 15–41)
Albumin: 3 g/dL — ABNORMAL LOW (ref 3.5–5.0)
Alkaline Phosphatase: 80 U/L (ref 38–126)
Bilirubin, Direct: 0.1 mg/dL (ref 0.1–0.5)
Indirect Bilirubin: 0.4 mg/dL (ref 0.3–0.9)
Total Bilirubin: 0.5 mg/dL (ref 0.3–1.2)
Total Protein: 6.7 g/dL (ref 6.5–8.1)

## 2017-08-03 LAB — BRAIN NATRIURETIC PEPTIDE: B Natriuretic Peptide: 264.8 pg/mL — ABNORMAL HIGH (ref 0.0–100.0)

## 2017-08-03 LAB — I-STAT BETA HCG BLOOD, ED (MC, WL, AP ONLY)

## 2017-08-03 LAB — CBC
HCT: 27.3 % — ABNORMAL LOW (ref 36.0–46.0)
Hemoglobin: 7.3 g/dL — ABNORMAL LOW (ref 12.0–15.0)
MCH: 21.9 pg — ABNORMAL LOW (ref 26.0–34.0)
MCHC: 26.7 g/dL — ABNORMAL LOW (ref 30.0–36.0)
MCV: 82 fL (ref 78.0–100.0)
PLATELETS: 328 10*3/uL (ref 150–400)
RBC: 3.33 MIL/uL — ABNORMAL LOW (ref 3.87–5.11)
RDW: 24.1 % — AB (ref 11.5–15.5)
WBC: 4.1 10*3/uL (ref 4.0–10.5)

## 2017-08-03 LAB — I-STAT TROPONIN, ED: TROPONIN I, POC: 0 ng/mL (ref 0.00–0.08)

## 2017-08-03 LAB — TROPONIN I: Troponin I: <0.03 ng/mL

## 2017-08-03 LAB — PREPARE RBC (CROSSMATCH)

## 2017-08-03 LAB — POC OCCULT BLOOD, ED: Fecal Occult Bld: NEGATIVE

## 2017-08-03 MED ORDER — AZITHROMYCIN 500 MG IV SOLR
500.0000 mg | Freq: Once | INTRAVENOUS | Status: AC
  Administered 2017-08-04: 500 mg via INTRAVENOUS
  Filled 2017-08-03: qty 500

## 2017-08-03 MED ORDER — ACETAMINOPHEN 325 MG PO TABS
650.0000 mg | ORAL_TABLET | Freq: Four times a day (QID) | ORAL | Status: DC | PRN
  Administered 2017-08-04 (×3): 650 mg via ORAL
  Filled 2017-08-03 (×3): qty 2

## 2017-08-03 MED ORDER — ACETAMINOPHEN 650 MG RE SUPP: 650.0000 mg | Freq: Four times a day (QID) | RECTAL | Status: DC | PRN

## 2017-08-03 MED ORDER — SODIUM CHLORIDE 0.9 % IV SOLN
1.0000 g | Freq: Once | INTRAVENOUS | Status: AC
  Administered 2017-08-03: 1 g via INTRAVENOUS
  Filled 2017-08-03: qty 10

## 2017-08-03 MED ORDER — ALBUTEROL SULFATE (2.5 MG/3ML) 0.083% IN NEBU: 2.5000 mg | INHALATION_SOLUTION | RESPIRATORY_TRACT | Status: DC | PRN

## 2017-08-03 MED ORDER — ONDANSETRON HCL 4 MG PO TABS: 4.0000 mg | ORAL_TABLET | Freq: Four times a day (QID) | ORAL | Status: DC | PRN

## 2017-08-03 MED ORDER — SODIUM CHLORIDE 0.9% FLUSH
3.0000 mL | Freq: Two times a day (BID) | INTRAVENOUS | Status: DC
  Administered 2017-08-04 – 2017-08-09 (×11): 3 mL via INTRAVENOUS

## 2017-08-03 MED ORDER — SODIUM CHLORIDE 0.9 % IV SOLN
Freq: Once | INTRAVENOUS | Status: AC
  Administered 2017-08-04: 01:00:00 via INTRAVENOUS

## 2017-08-03 MED ORDER — ONDANSETRON HCL 4 MG/2ML IJ SOLN: 4.0000 mg | Freq: Four times a day (QID) | INTRAMUSCULAR | Status: DC | PRN

## 2017-08-03 NOTE — ED Notes (Signed)
IV team in room  

## 2017-08-03 NOTE — H&P (Signed)
[facility] History and Physical  Rosalee Tolley WJX:[id number] DOB: Apr 12, 1966 DOA: 08/03/2017  Referring physician:  PCP: [name], MD   Chief Complaint: SOB  HPI: Jozlin Bently is a 52 y.o. female past history significant for CHF, CAD presents with shortness of breath.  Symptoms started 1 week ago he has bilateral aching squeezing the chest.  Worse with exertion.  Has been coughing.  Recent medication changes.  Denies any chest trauma.  ED course: No melena on DRE and Guiac neg per EDP.  Hospitalist consulted for admission.  Review of Systems:  As per HPI otherwise 10 point review of systems negative.    Past Medical History:  Diagnosis Date  . Anemia    Reportedly from heavy menses and pernicious anemia  . Arthritis    "knees" (07/31/2015)  . CHF (congestive heart failure) (HCC) ?2003  . DVT (deep venous thrombosis) (HCC) "after my heart attack"   "one of my legs"  . Heavy menses   . History of blood transfusion "I've had alot"   "I'm anemic"  . Myocardial infarction [facility]) 2003?  . OSA (obstructive sleep apnea) 2003  . Stroke [facility]) ?2003   Past Surgical History:  Procedure Laterality Date  . CARPAL TUNNEL RELEASE Right   . CESAREAN SECTION  1985; 1991; 1993  . CHOLECYSTECTOMY  2004   w/gastric OR  . GASTRIC BYPASS  2004  . TONSILLECTOMY     Social History:  reports that she quit smoking about 26 years ago. Her smoking use included cigarettes. She started smoking about 29 years ago. She has a 2.00 pack-year smoking history. She has never used smokeless tobacco. She reports that she does not drink alcohol or use drugs.  Allergies  Allergen Reactions  . Shellfish Allergy     Rash     Family History  Problem Relation Age of Onset  . Stroke Mother   . Hypertension Mother   . COPD Father   . Diabetes type II Daughter   . Cancer Neg Hx      Prior to Admission medications   Medication Sig Start Date End Date Taking? Authorizing Provider    acetaminophen (TYLENOL) 325 MG tablet Take 2 tablets (650 mg total) by mouth every 6 (six) hours as needed for mild pain. Patient taking differently: Take 975 mg by mouth every 6 (six) hours as needed for mild pain (Taking 3 tabs every 6 hours.).  10/05/15  Yes Love, Evlyn Kanner, PA-C  ferrous sulfate 325 (65 FE) MG tablet Take 1 tablet (325 mg total) by mouth 3 (three) times daily with meals. Patient taking differently: Take 325 mg by mouth 2 (two) times daily with a meal.  10/06/15  Yes Love, Evlyn Kanner, PA-C  furosemide (LASIX) 40 MG tablet Take 1 tablet (40 mg total) by mouth 2 (two) times daily. 10/06/15  Yes Love, Evlyn Kanner, PA-C  meloxicam (MOBIC) 15 MG tablet Take 1 tablet (15 mg total) by mouth daily. 11/02/15  Yes [name], MD  aspirin EC 81 MG EC tablet Take 1 tablet (81 mg total) by mouth daily. Patient not taking: Reported on 08/03/2017 10/06/15   Love, Evlyn Kanner, PA-C  Cholecalciferol 10000 units TABS Take 10,000 Units by mouth daily. Patient not taking: Reported on 08/03/2017 11/02/15   [name], MD  docusate sodium (COLACE) 100 MG capsule Take 1 capsule (100 mg total) by mouth 2 (two) times daily. Patient not taking: Reported on 08/28/2015 08/01/15   Maretta Bees, MD  Menthol-Methyl Salicylate (  MUSCLE RUB) 10-15 % CREA Apply 1 application topically 4 (four) times daily -  with meals and at bedtime. Patient not taking: Reported on 08/03/2017 10/05/15   Love, Evlyn Kanner, PA-C  multivitamin (PROSIGHT) TABS tablet Take 1 tablet by mouth daily. Patient not taking: Reported on 08/03/2017 10/06/15   Love, Evlyn Kanner, PA-C  potassium chloride SA (K-DUR,KLOR-CON) 20 MEQ tablet Take 1 tablet (20 mEq total) by mouth daily. 10/06/15   Love, Evlyn Kanner, PA-C  vitamin B-12 (CYANOCOBALAMIN) 100 MCG tablet Take 1 tablet (100 mcg total) by mouth 2 (two) times daily. Patient not taking: Reported on 11/02/2015 10/06/15   Jerene Pitch   Physical Exam: Vitals:   08/03/17 1915 08/03/17 2055  08/03/17 2100 08/03/17 2115  BP:  115/69 130/78 122/71  Pulse:  77 77 80  Resp:  15 20 19   SpO2: 97% 96% 94% 96%  Height:        Wt Readings from Last 3 Encounters:  11/02/15 (!) 167.4 kg (369 lb)  10/06/15 (!) 168.8 kg (372 lb 1.6 oz)  09/27/15 (!) 175.5 kg (386 lb 12.8 oz)    General:  Appears calm and comfortable; A&Ox3 Eyes:  PERRL, EOMI, normal lids, iris ENT:  grossly normal hearing, lips & tongue Neck:  no LAD, masses or thyromegaly Cardiovascular:  RRR, no m/r/g. No LE edema.  Respiratory:  CTA bilaterally, no w/r/r. Normal respiratory effort. Abdomen:  soft, ntnd Skin:  no rash or induration seen on limited exam Musculoskeletal:  grossly normal tone BUE/BLE Psychiatric:  grossly normal mood and affect, speech fluent and appropriate Neurologic:  CN 2-12 grossly intact, moves all extremities in coordinated fashion.          Labs on Admission:  Basic Metabolic Panel: Recent Labs  Lab 08/03/17 1949  NA 140  K 4.2  CL 104  CO2 28  GLUCOSE 98  BUN 8  CREATININE 0.50  CALCIUM 8.1*   Liver Function Tests: Recent Labs  Lab 08/03/17 1949  AST 19  ALT 12*  ALKPHOS 80  BILITOT 0.5  PROT 6.7  ALBUMIN 3.0*   No results for input(s): LIPASE, AMYLASE in the last 168 hours. No results for input(s): AMMONIA in the last 168 hours. CBC: Recent Labs  Lab 08/03/17 1949  WBC 4.1  HGB 7.3*  HCT 27.3*  MCV 82.0  PLT 328   Cardiac Enzymes: Recent Labs  Lab 08/03/17 1949  TROPONINI <0.03    BNP (last 3 results) Recent Labs    08/03/17 1949  BNP 264.8*    ProBNP (last 3 results) No results for input(s): PROBNP in the last 8760 hours.   Creatinine clearance cannot be calculated (Unknown ideal weight.)  CBG: No results for input(s): GLUCAP in the last 168 hours.  Radiological Exams on Admission: Dg Chest 2 View  Result Date: 08/03/2017 CLINICAL DATA:  Chest pain. EXAM: CHEST - 2 VIEW COMPARISON:  Chest x-ray and CT chest dated June 02, 2016.  FINDINGS: Stable cardiomegaly. Worsening perihilar interstitial opacities. New patchy opacities in the left lower lobe. No pleural effusion or pneumothorax. No acute osseous abnormality. IMPRESSION: 1. Stable cardiomegaly with worsening perihilar edema. 2. New patchy opacities in the left lower lobe, atelectasis versus infiltrate. Electronically Signed   By: Obie Dredge M.D.   On: 08/03/2017 19:28    EKG: Independently reviewed. NSR. No stemi  Assessment/Plan Active Problems:   Symptomatic anemia   Symptomatic Anemia Most recent hemoglobin 11.4, now 7.3 History of anemia Serial H&H  IV fluids Hx of transfusions GI consult not ordered Avoid NSAIDs 1u prbcs ordered by EDP To Mobic  CAP Cont azithro and rocephin Duonebs sch Prn albuterol Flutter/IS  CHF Continue Lasix  Code Status: FC  DVT Prophylaxis: SCDs Family Communication: none available Disposition Plan: Pending Improvement  Status: tele obs  Haydee Salter, MD Family Medicine [facility] [email or web address] Password [email or web address]

## 2017-08-03 NOTE — ED Triage Notes (Signed)
Pt BIB [facility] with c/o central chest pain and "squeezing" in her chest. Pt reports fall on Sunday. Hx CHF, increased swelling to bilateral ankles/legs, denies missing lasix doses. Given 324mg  aspirin and 2 NTG PTA. EMS vitals: B 118/80, SpO2 99% 2L, CBG 114

## 2017-08-03 NOTE — ED Notes (Signed)
Lab in room for blood draw

## 2017-08-03 NOTE — ED Provider Notes (Signed)
[facility] EMERGENCY DEPARTMENT Provider Note   CSN: [id number] Arrival date & time: 08/03/17  1846     History   Chief Complaint Chief Complaint  Patient presents with  . Chest Pain  . Shortness of Breath    HPI Dana Abbott is a 51 y.o. female.  HPI 52 year old female with past medical history as below including CHF, history of CAD, here with chest pain shortness of breath.  The patient states that her symptoms started approximately a week ago.  She says she was walking outside when she tripped and fell onto her right side of her chest.  She had difficulty getting up for approximately 2 hours.  She felt generally weak at the time.  Since then, she is had persistent generalized weakness.  She is also had a bilateral aching, dull, substernal, squeezing chest pain.  She said worsening dyspnea on exertion.  She also had increasing lower extremity edema and early satiety.  She is also noticed that she has been coughing more than usual.  She has begun to produce yellow-green sputum over the last day or 2.  She said subjective fevers and chills.  She said night sweats.  No alleviating factors.  No recent sick contacts.  She does not feel like she hit her head or anything else during the fall.  Past Medical History:  Diagnosis Date  . Anemia    Reportedly from heavy menses and pernicious anemia  . Arthritis    "knees" (07/31/2015)  . CHF (congestive heart failure) (HCC) ?2003  . DVT (deep venous thrombosis) (HCC) "after my heart attack"   "one of my legs"  . Heavy menses   . History of blood transfusion "I've had alot"   "I'm anemic"  . Myocardial infarction [facility]) 2003?  . OSA (obstructive sleep apnea) 2003  . Stroke [facility]) ?2003    Patient Active Problem List   Diagnosis Date Noted  . Myocardial infarction (HCC)   . Stroke (HCC)   . Anemia   . Arthritis   . CHF (congestive heart failure) (HCC)   . DVT (deep venous thrombosis) (HCC)   . Heavy menses   .  Vitamin B 12 deficiency 09/29/2015  . Debility 09/27/2015  . Morbid obesity due to excess calories (HCC)   . Exertional dyspnea   . Requires supplemental oxygen   . Primary osteoarthritis of right knee   . Abnormality of gait   . Fluid retention   . Hypoalbuminemia due to protein-calorie malnutrition (HCC)   . Iron deficiency anemia   . S/P gastric bypass   . Vitamin D deficiency   . OSA treated with BiPAP   . Pernicious anemia 09/22/2015  . CAD (coronary artery disease), native coronary artery 09/22/2015  . History of DVT (deep vein thrombosis) 09/22/2015  . Dyspnea 07/31/2015  . Hypoxia 07/30/2015  . Blood loss anemia 07/30/2015  . OSA (obstructive sleep apnea) 04/24/2001    Past Surgical History:  Procedure Laterality Date  . CARPAL TUNNEL RELEASE Right   . CESAREAN SECTION  1985; 1991; 1993  . CHOLECYSTECTOMY  2004   w/gastric OR  . GASTRIC BYPASS  2004  . TONSILLECTOMY       OB History   None      Home Medications    Prior to Admission medications   Medication Sig Start Date End Date Taking? Authorizing Provider  acetaminophen (TYLENOL) 325 MG tablet Take 2 tablets (650 mg total) by mouth every 6 (six) hours as needed  for mild pain. Patient taking differently: Take 975 mg by mouth every 6 (six) hours as needed for mild pain (Taking 3 tabs every 6 hours.).  10/05/15  Yes Love, Evlyn Kanner, PA-C  ferrous sulfate 325 (65 FE) MG tablet Take 1 tablet (325 mg total) by mouth 3 (three) times daily with meals. Patient taking differently: Take 325 mg by mouth 2 (two) times daily with a meal.  10/06/15  Yes Love, Evlyn Kanner, PA-C  furosemide (LASIX) 40 MG tablet Take 1 tablet (40 mg total) by mouth 2 (two) times daily. 10/06/15  Yes Love, Evlyn Kanner, PA-C  meloxicam (MOBIC) 15 MG tablet Take 1 tablet (15 mg total) by mouth daily. 11/02/15  Yes [name], MD  aspirin EC 81 MG EC tablet Take 1 tablet (81 mg total) by mouth daily. Patient not taking: Reported on 08/03/2017  10/06/15   Love, Evlyn Kanner, PA-C  Cholecalciferol 10000 units TABS Take 10,000 Units by mouth daily. Patient not taking: Reported on 08/03/2017 11/02/15   [name], MD  docusate sodium (COLACE) 100 MG capsule Take 1 capsule (100 mg total) by mouth 2 (two) times daily. Patient not taking: Reported on 08/28/2015 08/01/15   Maretta Bees, MD  Menthol-Methyl Salicylate (MUSCLE RUB) 10-15 % CREA Apply 1 application topically 4 (four) times daily -  with meals and at bedtime. Patient not taking: Reported on 08/03/2017 10/05/15   Love, Evlyn Kanner, PA-C  multivitamin (PROSIGHT) TABS tablet Take 1 tablet by mouth daily. Patient not taking: Reported on 08/03/2017 10/06/15   Love, Evlyn Kanner, PA-C  potassium chloride SA (K-DUR,KLOR-CON) 20 MEQ tablet Take 1 tablet (20 mEq total) by mouth daily. 10/06/15   Love, Evlyn Kanner, PA-C  vitamin B-12 (CYANOCOBALAMIN) 100 MCG tablet Take 1 tablet (100 mcg total) by mouth 2 (two) times daily. Patient not taking: Reported on 11/02/2015 10/06/15   Love, Evlyn Kanner, PA-C    Family History Family History  Problem Relation Age of Onset  . Stroke Mother   . Hypertension Mother   . COPD Father   . Diabetes type II Daughter   . Cancer Neg Hx     Social History Social History   Tobacco Use  . Smoking status: Former Smoker    Packs/day: 1.00    Years: 2.00    Pack years: 2.00    Types: Cigarettes    Start date: 04/24/1988    Last attempt to quit: 04/24/1991    Years since quitting: 26.2  . Smokeless tobacco: Never Used  Substance Use Topics  . Alcohol use: No    Alcohol/week: 0.0 oz    Comment: "I haven't drank in 20 years"  . Drug use: No     Allergies   Shellfish allergy   Review of Systems Review of Systems  Constitutional: Positive for fatigue.  Respiratory: Positive for cough, chest tightness and shortness of breath.   Cardiovascular: Positive for chest pain.  Neurological: Positive for weakness.  All other systems reviewed and are  negative.    Physical Exam Updated Vital Signs BP 115/69   Pulse 77   Resp 15   Ht 4\' 9"  (1.448 m)   SpO2 96%   BMI 79.85 kg/m   Physical Exam  Constitutional: She is oriented to person, place, and time. She appears well-developed and well-nourished. No distress.  HENT:  Head: Normocephalic and atraumatic.  Eyes: Conjunctivae are normal.  Neck: Neck supple.  Cardiovascular: Normal rate, regular rhythm and normal heart sounds. Exam reveals no  friction rub.  No murmur heard. Pulmonary/Chest: Effort normal. No respiratory distress. She has decreased breath sounds. She has no wheezes. She has rales in the left middle field and the left lower field.  Abdominal: She exhibits no distension.  Musculoskeletal:       Right lower leg: She exhibits edema.       Left lower leg: She exhibits edema.  Neurological: She is alert and oriented to person, place, and time. She exhibits normal muscle tone.  Skin: Skin is warm. Capillary refill takes less than 2 seconds.  Psychiatric: She has a normal mood and affect.  Nursing note and vitals reviewed.    ED Treatments / Results  Labs (all labs ordered are listed, but only abnormal results are displayed) Labs Reviewed  BASIC METABOLIC PANEL - Abnormal; Notable for the following components:      Result Value   Calcium 8.1 (*)    All other components within normal limits  CBC - Abnormal; Notable for the following components:   RBC 3.33 (*)    Hemoglobin 7.3 (*)    HCT 27.3 (*)    MCH 21.9 (*)    MCHC 26.7 (*)    RDW 24.1 (*)    All other components within normal limits  HEPATIC FUNCTION PANEL - Abnormal; Notable for the following components:   Albumin 3.0 (*)    ALT 12 (*)    All other components within normal limits  BRAIN NATRIURETIC PEPTIDE - Abnormal; Notable for the following components:   B Natriuretic Peptide 264.8 (*)    All other components within normal limits  CULTURE, BLOOD (ROUTINE X 2)  CULTURE, BLOOD (ROUTINE X 2)   TROPONIN I  I-STAT TROPONIN, ED  I-STAT BETA HCG BLOOD, ED ([facility], [facility], [facility] ONLY)  POC OCCULT BLOOD, ED  PREPARE RBC (CROSSMATCH)    EKG EKG Interpretation  Date/Time:  Friday August 03 2017 18:50:54 EDT Ventricular Rate:  98 PR Interval:    QRS Duration: 101 QT Interval:  326 QTC Calculation: 417 R Axis:   82 Text Interpretation:  Sinus rhythm Left atrial enlargement Abnormal R-wave progression, late transition Abnormal T, consider ischemia, diffuse leads Since last EKG, ST depressions now noted in II, III, V5 and V6 Confirmed by Shaune Pollack [phone]) on 08/03/2017 6:53:34 PM   Radiology Dg Chest 2 View  Result Date: 08/03/2017 CLINICAL DATA:  Chest pain. EXAM: CHEST - 2 VIEW COMPARISON:  Chest x-ray and CT chest dated June 02, 2016. FINDINGS: Stable cardiomegaly. Worsening perihilar interstitial opacities. New patchy opacities in the left lower lobe. No pleural effusion or pneumothorax. No acute osseous abnormality. IMPRESSION: 1. Stable cardiomegaly with worsening perihilar edema. 2. New patchy opacities in the left lower lobe, atelectasis versus infiltrate. Electronically Signed   By: Obie Dredge M.D.   On: 08/03/2017 19:28    Procedures Procedures (including critical care time)  Medications Ordered in ED Medications  cefTRIAXone (ROCEPHIN) 1 g in sodium chloride 0.9 % 100 mL IVPB (has no administration in time range)  azithromycin (ZITHROMAX) 500 mg in sodium chloride 0.9 % 250 mL IVPB (has no administration in time range)  0.9 %  sodium chloride infusion (has no administration in time range)     Initial Impression / Assessment and Plan / ED Course  I have reviewed the triage vital signs and the nursing notes.  Pertinent labs & imaging results that were available during my care of the patient were reviewed by me and considered in my medical decision  making (see chart for details).     52 year old female here with cough, shortness of breath, and sputum production  as well as chest pain.  From her cough and sputum production standpoint, this began after a fall approximately a week ago.  Suspect that she may have had a rib contusion with subsequent splinting and now superimposed pneumonia.  Left lower lobe has rales on exam and chest x-ray is concerning for infiltrate and pneumonia.  No signs of sepsis.  Will treat with Rocephin and azithromycin.  Otherwise, regarding her chest pressure and generalized weakness with leg swelling, concern for possible CHF exacerbation.  However, she does not appear to be in CHF on chest x-ray and BNP is only moderately elevated.  She does appear acutely on chronic anemic.  This may be symptomatic anemia, also made worse with her pneumonia.  Will give her a unit, after which she may need Lasix.  She also has new ST depressions on her EKG.  I suspect this is demand in the setting of her illness.  Troponin negative.  No ST elevations.  Admit to medicine.  Final Clinical Impressions(s) / ED Diagnoses   Final diagnoses:  Community acquired pneumonia of left lower lobe of lung (HCC)  Acute on chronic anemia    ED Discharge Orders    None       Shaune Pollack, MD 08/03/17 2111

## 2017-08-04 ENCOUNTER — Encounter (HOSPITAL_COMMUNITY): Payer: Self-pay

## 2017-08-04 ENCOUNTER — Other Ambulatory Visit: Payer: Self-pay

## 2017-08-04 DIAGNOSIS — T454X6A Underdosing of iron and its compounds, initial encounter: Secondary | ICD-10-CM | POA: Diagnosis present

## 2017-08-04 DIAGNOSIS — Z9884 Bariatric surgery status: Secondary | ICD-10-CM | POA: Diagnosis not present

## 2017-08-04 DIAGNOSIS — J9621 Acute and chronic respiratory failure with hypoxia: Secondary | ICD-10-CM | POA: Diagnosis present

## 2017-08-04 DIAGNOSIS — I272 Pulmonary hypertension, unspecified: Secondary | ICD-10-CM | POA: Diagnosis present

## 2017-08-04 DIAGNOSIS — Z791 Long term (current) use of non-steroidal anti-inflammatories (NSAID): Secondary | ICD-10-CM | POA: Diagnosis not present

## 2017-08-04 DIAGNOSIS — N920 Excessive and frequent menstruation with regular cycle: Secondary | ICD-10-CM | POA: Diagnosis present

## 2017-08-04 DIAGNOSIS — D638 Anemia in other chronic diseases classified elsewhere: Secondary | ICD-10-CM | POA: Diagnosis not present

## 2017-08-04 DIAGNOSIS — Z87891 Personal history of nicotine dependence: Secondary | ICD-10-CM | POA: Diagnosis not present

## 2017-08-04 DIAGNOSIS — J9611 Chronic respiratory failure with hypoxia: Secondary | ICD-10-CM | POA: Diagnosis not present

## 2017-08-04 DIAGNOSIS — Z91013 Allergy to seafood: Secondary | ICD-10-CM | POA: Diagnosis not present

## 2017-08-04 DIAGNOSIS — R5381 Other malaise: Secondary | ICD-10-CM | POA: Diagnosis present

## 2017-08-04 DIAGNOSIS — J181 Lobar pneumonia, unspecified organism: Secondary | ICD-10-CM

## 2017-08-04 DIAGNOSIS — D649 Anemia, unspecified: Secondary | ICD-10-CM | POA: Diagnosis not present

## 2017-08-04 DIAGNOSIS — Z8249 Family history of ischemic heart disease and other diseases of the circulatory system: Secondary | ICD-10-CM | POA: Diagnosis not present

## 2017-08-04 DIAGNOSIS — Z8673 Personal history of transient ischemic attack (TIA), and cerebral infarction without residual deficits: Secondary | ICD-10-CM | POA: Diagnosis not present

## 2017-08-04 DIAGNOSIS — Z7982 Long term (current) use of aspirin: Secondary | ICD-10-CM | POA: Diagnosis not present

## 2017-08-04 DIAGNOSIS — I5033 Acute on chronic diastolic (congestive) heart failure: Secondary | ICD-10-CM | POA: Diagnosis present

## 2017-08-04 DIAGNOSIS — Z6841 Body Mass Index (BMI) 40.0 and over, adult: Secondary | ICD-10-CM | POA: Diagnosis not present

## 2017-08-04 DIAGNOSIS — R52 Pain, unspecified: Secondary | ICD-10-CM | POA: Diagnosis not present

## 2017-08-04 DIAGNOSIS — Z823 Family history of stroke: Secondary | ICD-10-CM | POA: Diagnosis not present

## 2017-08-04 DIAGNOSIS — I361 Nonrheumatic tricuspid (valve) insufficiency: Secondary | ICD-10-CM | POA: Diagnosis not present

## 2017-08-04 DIAGNOSIS — Z91128 Patient's intentional underdosing of medication regimen for other reason: Secondary | ICD-10-CM | POA: Diagnosis not present

## 2017-08-04 DIAGNOSIS — Z86718 Personal history of other venous thrombosis and embolism: Secondary | ICD-10-CM | POA: Diagnosis not present

## 2017-08-04 DIAGNOSIS — J9610 Chronic respiratory failure, unspecified whether with hypoxia or hypercapnia: Secondary | ICD-10-CM | POA: Diagnosis not present

## 2017-08-04 DIAGNOSIS — E662 Morbid (severe) obesity with alveolar hypoventilation: Secondary | ICD-10-CM | POA: Diagnosis present

## 2017-08-04 DIAGNOSIS — J189 Pneumonia, unspecified organism: Secondary | ICD-10-CM

## 2017-08-04 DIAGNOSIS — Z9981 Dependence on supplemental oxygen: Secondary | ICD-10-CM | POA: Diagnosis not present

## 2017-08-04 DIAGNOSIS — I2721 Secondary pulmonary arterial hypertension: Secondary | ICD-10-CM | POA: Diagnosis not present

## 2017-08-04 DIAGNOSIS — D509 Iron deficiency anemia, unspecified: Secondary | ICD-10-CM | POA: Diagnosis present

## 2017-08-04 DIAGNOSIS — D510 Vitamin B12 deficiency anemia due to intrinsic factor deficiency: Secondary | ICD-10-CM | POA: Diagnosis present

## 2017-08-04 DIAGNOSIS — I252 Old myocardial infarction: Secondary | ICD-10-CM | POA: Diagnosis not present

## 2017-08-04 DIAGNOSIS — M1711 Unilateral primary osteoarthritis, right knee: Secondary | ICD-10-CM | POA: Diagnosis present

## 2017-08-04 DIAGNOSIS — I2510 Atherosclerotic heart disease of native coronary artery without angina pectoris: Secondary | ICD-10-CM | POA: Diagnosis not present

## 2017-08-04 DIAGNOSIS — R072 Precordial pain: Secondary | ICD-10-CM | POA: Diagnosis not present

## 2017-08-04 LAB — BASIC METABOLIC PANEL
ANION GAP: 10 (ref 5–15)
BUN: 5 mg/dL — AB (ref 6–20)
CALCIUM: 8.1 mg/dL — AB (ref 8.9–10.3)
CO2: 27 mmol/L (ref 22–32)
Chloride: 103 mmol/L (ref 101–111)
Creatinine, Ser: 0.41 mg/dL — ABNORMAL LOW (ref 0.44–1.00)
GFR calc Af Amer: >60 mL/min (ref >60)
GLUCOSE: 101 mg/dL — AB (ref 65–99)
Potassium: 3.8 mmol/L (ref 3.5–5.1)
Sodium: 140 mmol/L (ref 135–145)

## 2017-08-04 LAB — CBC
HCT: 27 % — ABNORMAL LOW (ref 36.0–46.0)
HEMOGLOBIN: 7.1 g/dL — AB (ref 12.0–15.0)
MCH: 21.6 pg — AB (ref 26.0–34.0)
MCHC: 26.3 g/dL — AB (ref 30.0–36.0)
MCV: 82.3 fL (ref 78.0–100.0)
Platelets: 355 10*3/uL (ref 150–400)
RBC: 3.28 MIL/uL — ABNORMAL LOW (ref 3.87–5.11)
RDW: 24.4 % — AB (ref 11.5–15.5)
WBC: 4.9 10*3/uL (ref 4.0–10.5)

## 2017-08-04 LAB — HIV ANTIBODY (ROUTINE TESTING W REFLEX): HIV SCREEN 4TH GENERATION: NONREACTIVE

## 2017-08-04 LAB — HEMOGLOBIN AND HEMATOCRIT, BLOOD
HEMATOCRIT: 29.2 % — AB (ref 36.0–46.0)
HEMOGLOBIN: 7.8 g/dL — AB (ref 12.0–15.0)

## 2017-08-04 MED ORDER — ACETAMINOPHEN 325 MG PO TABS: 650.0000 mg | ORAL_TABLET | Freq: Four times a day (QID) | ORAL | Status: DC | PRN

## 2017-08-04 MED ORDER — FUROSEMIDE 10 MG/ML IJ SOLN
20.0000 mg | Freq: Every day | INTRAMUSCULAR | Status: DC
  Administered 2017-08-04: 20 mg via INTRAVENOUS
  Filled 2017-08-04: qty 2

## 2017-08-04 MED ORDER — SODIUM CHLORIDE 0.9 % IV SOLN
500.0000 mg | INTRAVENOUS | Status: AC
  Administered 2017-08-04 – 2017-08-05 (×2): 500 mg via INTRAVENOUS
  Filled 2017-08-04 (×2): qty 500

## 2017-08-04 MED ORDER — HYDROCODONE-ACETAMINOPHEN 5-325 MG PO TABS
1.0000 | ORAL_TABLET | Freq: Four times a day (QID) | ORAL | Status: DC | PRN
  Administered 2017-08-05: 1 via ORAL
  Administered 2017-08-05 – 2017-08-06 (×3): 2 via ORAL
  Administered 2017-08-06: 1 via ORAL
  Administered 2017-08-07 – 2017-08-08 (×2): 2 via ORAL
  Filled 2017-08-04: qty 1
  Filled 2017-08-04: qty 2
  Filled 2017-08-04: qty 1
  Filled 2017-08-04 (×4): qty 2

## 2017-08-04 MED ORDER — MELOXICAM 7.5 MG PO TABS
15.0000 mg | ORAL_TABLET | Freq: Every day | ORAL | Status: DC
  Administered 2017-08-04: 15 mg via ORAL
  Filled 2017-08-04: qty 2

## 2017-08-04 MED ORDER — IPRATROPIUM-ALBUTEROL 0.5-2.5 (3) MG/3ML IN SOLN
3.0000 mL | Freq: Four times a day (QID) | RESPIRATORY_TRACT | Status: DC
  Administered 2017-08-04: 3 mL via RESPIRATORY_TRACT
  Filled 2017-08-04: qty 3

## 2017-08-04 MED ORDER — SODIUM CHLORIDE 0.9 % IV SOLN
1.0000 g | INTRAVENOUS | Status: DC
  Administered 2017-08-04 – 2017-08-08 (×5): 1 g via INTRAVENOUS
  Filled 2017-08-04 (×6): qty 10

## 2017-08-04 MED ORDER — FERROUS SULFATE 325 (65 FE) MG PO TABS
325.0000 mg | ORAL_TABLET | Freq: Three times a day (TID) | ORAL | Status: DC
  Administered 2017-08-04 – 2017-08-09 (×16): 325 mg via ORAL
  Filled 2017-08-04 (×15): qty 1

## 2017-08-04 MED ORDER — OXYCODONE HCL 5 MG PO TABS
5.0000 mg | ORAL_TABLET | Freq: Once | ORAL | Status: AC
  Administered 2017-08-04: 5 mg via ORAL
  Filled 2017-08-04: qty 1

## 2017-08-04 MED ORDER — GUAIFENESIN ER 600 MG PO TB12
600.0000 mg | ORAL_TABLET | Freq: Two times a day (BID) | ORAL | Status: DC
  Administered 2017-08-04 – 2017-08-09 (×12): 600 mg via ORAL
  Filled 2017-08-04 (×13): qty 1

## 2017-08-04 MED ORDER — POTASSIUM CHLORIDE CRYS ER 20 MEQ PO TBCR
20.0000 meq | EXTENDED_RELEASE_TABLET | Freq: Every day | ORAL | Status: DC
  Administered 2017-08-04 – 2017-08-09 (×6): 20 meq via ORAL
  Filled 2017-08-04 (×6): qty 1

## 2017-08-04 NOTE — Progress Notes (Signed)
PROGRESS NOTE    Dana Abbott  GEX:[id number] DOB: 03/01/1966 DOA: 08/03/2017 PCP: [name], MD  Brief Narrative:51 y.o. female past history significant for CHF, CAD presents with shortness of breath.  Symptoms started 1 week ago he has bilateral aching squeezing the chest.  Worse with exertion.  Has been coughing.  Recent medication changes.  Denies any chest trauma.  ED course: No melena on DRE and Guiac neg per EDP.  Hospitalist consulted for admission. 08/04/2017-she reports that she has a history of chronic iron deficiency anemia/pernicious anemia and heavy menstrual bleed for many years.she thinks it from the multiple abdominal surgeries she had including the gastric bypass.she had brown stools yesterday.she is getting blood transfusion.she wants her lasix restarted for b/l lower ext swelling which is worse than usual.   Assessment & Plan:   Active Problems:   Symptomatic anemia  1]acute on chronic iron deficiency anemia- getting first unit of blood transfusion.no evidence of gi bleed.follow up h nad h tomorrow.restart iron.  2]CAP on rocephin and azithro and svn.cxr new opacity.  3]CHF  Worsening peri hilar edema.restart lasix at lower dose since her bp is soft.  4]arthritis- restart mobic.fobt negative.   DVT prophylaxis:scd Code Status: full Family Communication:no family available. Disposition Plan:will consult pt today.would think she should be able to be discharged in 1 to 2 days as long as her hb remains stable and pneumonia better.she has oxygen at home prn. Consultants: none  Proceduresnone Antimicrobials rocephin and azithro. Subjective:feels better asking why im npo?   Objective:resting in bed in nad.talks in full sentences. Vitals:   08/04/17 0608 08/04/17 0638 08/04/17 0800 08/04/17 0908  BP: (!) 100/50 112/64 119/64 123/74  Pulse: 94 81 78 79  Resp: (!) 22 (!) 24 (!) 26 16  Temp: 99.1 F (37.3 C) 99 F (37.2 C) 98.9 F (37.2 C) 98.3 F  (36.8 C)  TempSrc: Oral Oral Oral Oral  SpO2:  97% 96%   Weight:      Height:        Intake/Output Summary (Last 24 hours) at 08/04/2017 1202 Last data filed at 08/04/2017 0925 Gross per 24 hour  Intake 915 ml  Output -  Net 915 ml   Filed Weights   08/04/17 0020  Weight: (!) 162 kg (357 lb 2.3 oz)    Examination:  General exam: Appears calm and comfortable  Respiratory system: Clear to auscultation. Respiratory effort normal. Cardiovascular system: S1 & S2 heard, RRR. No JVD, murmurs, rubs, gallops or clicks. No pedal edema. Gastrointestinal system: Abdomen is nondistended, soft and nontender. No organomegaly or masses felt. Normal bowel sounds heard. Central nervous system: Alert and oriented. No focal neurological deficits. Extremities: Symmetric 5 x 5 power. Skin: No rashes, lesions or ulcers Psychiatry: Judgement and insight appear normal. Mood & affect appropriate.     Data Reviewed: I have personally reviewed following labs and imaging studies  CBC: Recent Labs  Lab 08/03/17 1949 08/04/17 0238 08/04/17 1119  WBC 4.1 4.9  --   HGB 7.3* 7.1* 7.8*  HCT 27.3* 27.0* 29.2*  MCV 82.0 82.3  --   PLT 328 355  --    Basic Metabolic Panel: Recent Labs  Lab 08/03/17 1949 08/04/17 0238  NA 140 140  K 4.2 3.8  CL 104 103  CO2 28 27  GLUCOSE 98 101*  BUN 8 5*  CREATININE 0.50 0.41*  CALCIUM 8.1* 8.1*   GFR: Estimated Creatinine Clearance: 118.2 mL/min (A) (by C-G formula based on SCr of  0.41 mg/dL (L)). Liver Function Tests: Recent Labs  Lab 08/03/17 1949  AST 19  ALT 12*  ALKPHOS 80  BILITOT 0.5  PROT 6.7  ALBUMIN 3.0*   No results for input(s): LIPASE, AMYLASE in the last 168 hours. No results for input(s): AMMONIA in the last 168 hours. Coagulation Profile: No results for input(s): INR, PROTIME in the last 168 hours. Cardiac Enzymes: Recent Labs  Lab 08/03/17 1949  TROPONINI <0.03   BNP (last 3 results) No results for input(s): PROBNP in  the last 8760 hours. HbA1C: No results for input(s): HGBA1C in the last 72 hours. CBG: No results for input(s): GLUCAP in the last 168 hours. Lipid Profile: No results for input(s): CHOL, HDL, LDLCALC, TRIG, CHOLHDL, LDLDIRECT in the last 72 hours. Thyroid Function Tests: No results for input(s): TSH, T4TOTAL, FREET4, T3FREE, THYROIDAB in the last 72 hours. Anemia Panel: No results for input(s): VITAMINB12, FOLATE, FERRITIN, TIBC, IRON, RETICCTPCT in the last 72 hours. Sepsis Labs: No results for input(s): PROCALCITON, LATICACIDVEN in the last 168 hours.  No results found for this or any previous visit (from the past 240 hour(s)).       Radiology Studies: Dg Chest 2 View  Result Date: 08/03/2017 CLINICAL DATA:  Chest pain. EXAM: CHEST - 2 VIEW COMPARISON:  Chest x-ray and CT chest dated June 02, 2016. FINDINGS: Stable cardiomegaly. Worsening perihilar interstitial opacities. New patchy opacities in the left lower lobe. No pleural effusion or pneumothorax. No acute osseous abnormality. IMPRESSION: 1. Stable cardiomegaly with worsening perihilar edema. 2. New patchy opacities in the left lower lobe, atelectasis versus infiltrate. Electronically Signed   By: Obie Dredge M.D.   On: 08/03/2017 19:28        Scheduled Meds: . ferrous sulfate  325 mg Oral TID WC  . guaiFENesin  600 mg Oral BID  . meloxicam  15 mg Oral Daily  . sodium chloride flush  3 mL Intravenous Q12H   Continuous Infusions: . azithromycin    . cefTRIAXone (ROCEPHIN)  IV       LOS: 0 days       Alwyn Ren, MD [facility]  If 7PM-7AM, please contact night-coverage [email or web address] Password [facility] 08/04/2017, 12:02 PM

## 2017-08-04 NOTE — Progress Notes (Signed)
Patient states that she does not wear a CPAP at home for her OSA.

## 2017-08-04 NOTE — Progress Notes (Signed)
Patient signed consent for blood transfusion.  One unit of PRBC's started to transfuse via left AC PIV.  Vital signs prior to start of transfusion.  Vital signs checked again 15 minutes into start of transfusion.  Patient denies pain or shortness of breath during transfusion.  Will continue to monitor tolerance of transfusion.

## 2017-08-05 ENCOUNTER — Inpatient Hospital Stay (HOSPITAL_COMMUNITY): Payer: Medicaid Other

## 2017-08-05 LAB — CBC WITH DIFFERENTIAL/PLATELET
BASOS ABS: 0 10*3/uL (ref 0.0–0.1)
Basophils Relative: 0 %
EOS ABS: 0.2 10*3/uL (ref 0.0–0.7)
Eosinophils Relative: 3 %
HEMATOCRIT: 29.8 % — AB (ref 36.0–46.0)
Hemoglobin: 8.3 g/dL — ABNORMAL LOW (ref 12.0–15.0)
LYMPHS ABS: 1.6 10*3/uL (ref 0.7–4.0)
Lymphocytes Relative: 30 %
MCH: 22.9 pg — AB (ref 26.0–34.0)
MCHC: 27.9 g/dL — AB (ref 30.0–36.0)
MCV: 82.3 fL (ref 78.0–100.0)
Monocytes Absolute: 0.3 10*3/uL (ref 0.1–1.0)
Monocytes Relative: 6 %
NEUTROS ABS: 3.3 10*3/uL (ref 1.7–7.7)
Neutrophils Relative %: 61 %
Platelets: 493 10*3/uL — ABNORMAL HIGH (ref 150–400)
RBC: 3.62 MIL/uL — ABNORMAL LOW (ref 3.87–5.11)
RDW: 24.1 % — AB (ref 11.5–15.5)
WBC: 5.4 10*3/uL (ref 4.0–10.5)

## 2017-08-05 LAB — IRON AND TIBC
Iron: 18 ug/dL — ABNORMAL LOW (ref 28–170)
SATURATION RATIOS: 4 % — AB (ref 10.4–31.8)
TIBC: 410 ug/dL (ref 250–450)
UIBC: 392 ug/dL

## 2017-08-05 LAB — FERRITIN: Ferritin: 5 ng/mL — ABNORMAL LOW (ref 11–307)

## 2017-08-05 LAB — TYPE AND SCREEN
ABO/RH(D): AB POS
Antibody Screen: NEGATIVE
UNIT DIVISION: 0

## 2017-08-05 LAB — FOLATE: FOLATE: 10.6 ng/mL (ref >5.9)

## 2017-08-05 LAB — BPAM RBC
BLOOD PRODUCT EXPIRATION DATE: 201904242359
ISSUE DATE / TIME: 201904130600
Unit Type and Rh: 8400

## 2017-08-05 LAB — RETICULOCYTES
RBC.: 3.4 MIL/uL — AB (ref 3.87–5.11)
RETIC COUNT ABSOLUTE: 74.8 10*3/uL (ref 19.0–186.0)
RETIC CT PCT: 2.2 % (ref 0.4–3.1)

## 2017-08-05 LAB — LACTATE DEHYDROGENASE: LDH: 215 U/L — ABNORMAL HIGH (ref 98–192)

## 2017-08-05 LAB — VITAMIN B12: VITAMIN B 12: 130 pg/mL — AB (ref 180–914)

## 2017-08-05 MED ORDER — CYANOCOBALAMIN 1000 MCG/ML IJ SOLN
1000.0000 ug | Freq: Every day | INTRAMUSCULAR | Status: DC
  Administered 2017-08-05 – 2017-08-09 (×5): 1000 ug via INTRAMUSCULAR
  Filled 2017-08-05 (×5): qty 1

## 2017-08-05 MED ORDER — SODIUM CHLORIDE 0.9 % IV SOLN
510.0000 mg | Freq: Once | INTRAVENOUS | Status: AC
  Administered 2017-08-05: 510 mg via INTRAVENOUS
  Filled 2017-08-05: qty 17

## 2017-08-05 MED ORDER — MELOXICAM 7.5 MG PO TABS
15.0000 mg | ORAL_TABLET | Freq: Every day | ORAL | Status: DC
  Administered 2017-08-05 – 2017-08-09 (×5): 15 mg via ORAL
  Filled 2017-08-05 (×5): qty 2

## 2017-08-05 MED ORDER — FUROSEMIDE 10 MG/ML IJ SOLN
40.0000 mg | Freq: Every day | INTRAMUSCULAR | Status: DC
  Administered 2017-08-05 – 2017-08-06 (×2): 40 mg via INTRAVENOUS
  Filled 2017-08-05 (×3): qty 4

## 2017-08-05 NOTE — Progress Notes (Signed)
[facility]                                                                              Patient Demographics  Dana Abbott, is a 52 y.o. female, DOB - Mar 08, 1966, WUX:[id number]  Admit date - 08/03/2017   Admitting Physician Haydee Salter, MD  Outpatient Primary MD for the patient is [name], MD  Outpatient specialists:   LOS - 1  days   Medical records reviewed and are as summarized below:    Chief Complaint  Patient presents with  . Chest Pain  . Shortness of Breath       Brief summary   52 y.o.femalepast history significant for CHF, CAD presents with shortness of breath. Symptoms started 1 week ago he has bilateral aching squeezing the chest. Worse with exertion, no chest pain. Has been coughing. No melena on DREand Guiac neg perEDP.  She also reported that she has a history of chronic iron deficiency anemia/pernicious anemia and heavy menstrual bleed for many years, from the multiple abdominal surgeries she had including the gastric bypass     Assessment & Plan    Principal problem Acute on chronic symptomatic anemia, iron deficiency and B12 deficiency -Patient has a history of gastric bypass surgery and states she has not been taking iron supplementation and B12 supplementation consistently -Hemoglobin 7.1 at the time of admission improved to 8.3 after 1 unit packed RBC transfusion, B12 130 low, iron profile consistent with iron deficiency -Will place on IM B12 supplementation until discharged and then place on 1000 mcg daily, IV Feraheme x1  Active problems    Community acquired pneumonia of left lower lobe of lung (HCC) -Currently stable, continue IV Rocephin, Zithromax  Acute on chronic diastolic CHF, underlying chronic respiratory failure -Previous echo in 2017 showed normal EF 55%, complaining of shortness of breath, exertional -P elevated to 64.8, placed on Lasix IV, increase to 40 mg -Follow strict I's and O's  and daily weights   Osteoarthritis and right knee pain -Per patient she fell on her right side prior to admission, right knee x-ray showed severe osteoarthritis in the right knee, particularly in the medial knee compartment, probable joint effusion -Negative FOBT, restart meloxicam  Code Status: Full CODE STATUS DVT Prophylaxis:  Lovenox  Family Communication: Discussed in detail with the patient, all imaging results, lab results explained to the patient    Disposition Plan: Hopefully DC home in a.m. if stable  Time Spent in minutes   35 minutes  Procedures:  None  Consultants:   None  Antimicrobials:      Medications  Scheduled Meds: . ferrous sulfate  325 mg Oral TID WC  . furosemide  40 mg Intravenous Daily  . guaiFENesin  600 mg Oral BID  . meloxicam  15 mg Oral Daily  . potassium chloride  20 mEq Oral Daily  . sodium chloride flush  3 mL Intravenous Q12H   Continuous Infusions: . azithromycin Stopped (08/04/17 2315)  . cefTRIAXone (ROCEPHIN)  IV Stopped (08/04/17 2208)   PRN Meds:.acetaminophen **OR** acetaminophen, albuterol, HYDROcodone-acetaminophen, ondansetron **OR** ondansetron (ZOFRAN) IV   Antibiotics   Anti-infectives (From  admission, onward)   Start     Dose/Rate Route Frequency Ordered Stop   08/04/17 2200  azithromycin (ZITHROMAX) 500 mg in sodium chloride 0.9 % 250 mL IVPB     500 mg 250 mL/hr over 60 Minutes Intravenous Every 24 hours 08/04/17 0244 08/06/17 2159   08/04/17 2200  cefTRIAXone (ROCEPHIN) 1 g in sodium chloride 0.9 % 100 mL IVPB     1 g 200 mL/hr over 30 Minutes Intravenous Every 24 hours 08/04/17 0244     08/03/17 2100  cefTRIAXone (ROCEPHIN) 1 g in sodium chloride 0.9 % 100 mL IVPB     1 g 200 mL/hr over 30 Minutes Intravenous  Once 08/03/17 2058 08/03/17 2248   08/03/17 2100  azithromycin (ZITHROMAX) 500 mg in sodium chloride 0.9 % 250 mL IVPB     500 mg 250 mL/hr over 60 Minutes Intravenous  Once 08/03/17 2058 08/04/17  0135        Subjective:   Dana Abbott was seen and examined today.  Patient denies dizziness, chest pain, shortness of breath, abdominal pain, N/V/D/C.  Feels a little short of breath today,  Objective:   Vitals:   08/04/17 1618 08/04/17 2243 08/04/17 2315 08/05/17 0839  BP: (!) 130/114  (!) 99/57 101/63  Pulse: 84  95 75  Resp: 20  16 18   Temp: 98.9 F (37.2 C)  98.6 F (37 C) 99.4 F (37.4 C)  TempSrc: Oral  Oral Oral  SpO2: 97%  96% 96%  Weight:  (!) 162.7 kg (358 lb 9.6 oz)    Height:        Intake/Output Summary (Last 24 hours) at 08/05/2017 1119 Last data filed at 08/04/2017 2318 Gross per 24 hour  Intake 1079 ml  Output 1150 ml  Net -71 ml     Wt Readings from Last 3 Encounters:  08/04/17 (!) 162.7 kg (358 lb 9.6 oz)  11/02/15 (!) 167.4 kg (369 lb)  10/06/15 (!) 168.8 kg (372 lb 1.6 oz)     Exam  General: Alert and oriented x 3, NAD  Eyes: PERRLA, EOMI, Anicteric Sclera,  HEENT:  Atraumatic, normocephalic, normal oropharynx  Cardiovascular: S1 S2 auscultated, no rubs, murmurs or gallops. Regular rate and rhythm.  Respiratory: Clear to auscultation bilaterally, no wheezing, rales or rhonchi  Gastrointestinal: Soft, nontender, nondistended, + bowel sounds  Ext: no pedal edema bilaterally  Neuro: no new deficits  Musculoskeletal: No digital cyanosis, clubbing   Data Reviewed:  I have personally reviewed following labs and imaging studies  Micro Results Recent Results (from the past 240 hour(s))  Blood culture (routine x 2)     Status: None (Preliminary result)   Collection Time: 08/03/17  8:35 PM  Result Value Ref Range Status   Specimen Description BLOOD RIGHT HAND  Final   Special Requests   Final    BOTTLES DRAWN AEROBIC AND ANAEROBIC Blood Culture adequate volume   Culture   Final    NO GROWTH < 24 HOURS Performed at [facility], [address], [place], [place] [address]    Report Status PENDING  Incomplete  Blood culture  (routine x 2)     Status: None (Preliminary result)   Collection Time: 08/03/17  8:50 PM  Result Value Ref Range Status   Specimen Description BLOOD LEFT WRIST  Final   Special Requests   Final    BOTTLES DRAWN AEROBIC AND ANAEROBIC Blood Culture adequate volume   Culture   Final    NO  GROWTH < 24 HOURS Performed at [facility], [address] [place], [place] [address]    Report Status PENDING  Incomplete    Radiology Reports Dg Chest 2 View  Result Date: 08/03/2017 CLINICAL DATA:  Chest pain. EXAM: CHEST - 2 VIEW COMPARISON:  Chest x-ray and CT chest dated June 02, 2016. FINDINGS: Stable cardiomegaly. Worsening perihilar interstitial opacities. New patchy opacities in the left lower lobe. No pleural effusion or pneumothorax. No acute osseous abnormality. IMPRESSION: 1. Stable cardiomegaly with worsening perihilar edema. 2. New patchy opacities in the left lower lobe, atelectasis versus infiltrate. Electronically Signed   By: Obie Dredge M.D.   On: 08/03/2017 19:28   Dg Knee 3 View Right  Result Date: 08/05/2017 CLINICAL DATA:  Recent fall with right knee pain. Pain is along the anterior surface. EXAM: RIGHT KNEE - 3 VIEW COMPARISON:  02/09/2016 FINDINGS: Again noted are severe degenerative changes involving the right knee. Severe medial compartment narrowing with flattening and remodeling of the medial femoral condyle. Mild lateral subluxation of the right knee associated with the degenerative disease. Diffuse osteophytosis in the knee. Negative for an acute fracture or dislocation. Probable suprapatellar joint effusion. Bone detail is limited due to body habitus. IMPRESSION: Severe osteoarthritis in the right knee, particularly in the medial knee compartment. Probable joint effusion. No acute bone abnormality. Electronically Signed   By: Richarda Overlie M.D.   On: 08/05/2017 10:05    Lab Data:  CBC: Recent Labs  Lab 08/03/17 1949 08/04/17 0238 08/04/17 1119 08/05/17 0145    WBC 4.1 4.9  --  5.4  NEUTROABS  --   --   --  3.3  HGB 7.3* 7.1* 7.8* 8.3*  HCT 27.3* 27.0* 29.2* 29.8*  MCV 82.0 82.3  --  82.3  PLT 328 355  --  493*   Basic Metabolic Panel: Recent Labs  Lab 08/03/17 1949 08/04/17 0238  NA 140 140  K 4.2 3.8  CL 104 103  CO2 28 27  GLUCOSE 98 101*  BUN 8 5*  CREATININE 0.50 0.41*  CALCIUM 8.1* 8.1*   GFR: Estimated Creatinine Clearance: 118.4 mL/min (A) (by C-G formula based on SCr of 0.41 mg/dL (L)). Liver Function Tests: Recent Labs  Lab 08/03/17 1949  AST 19  ALT 12*  ALKPHOS 80  BILITOT 0.5  PROT 6.7  ALBUMIN 3.0*   No results for input(s): LIPASE, AMYLASE in the last 168 hours. No results for input(s): AMMONIA in the last 168 hours. Coagulation Profile: No results for input(s): INR, PROTIME in the last 168 hours. Cardiac Enzymes: Recent Labs  Lab 08/03/17 1949  TROPONINI <0.03   BNP (last 3 results) No results for input(s): PROBNP in the last 8760 hours. HbA1C: No results for input(s): HGBA1C in the last 72 hours. CBG: No results for input(s): GLUCAP in the last 168 hours. Lipid Profile: No results for input(s): CHOL, HDL, LDLCALC, TRIG, CHOLHDL, LDLDIRECT in the last 72 hours. Thyroid Function Tests: No results for input(s): TSH, T4TOTAL, FREET4, T3FREE, THYROIDAB in the last 72 hours. Anemia Panel: Recent Labs    08/05/17 0656  VITAMINB12 130*  FOLATE 10.6  FERRITIN 5*  TIBC 410  IRON 18*  RETICCTPCT 2.2   Urine analysis:    Component Value Date/Time   COLORURINE YELLOW 07/31/2015 0154   APPEARANCEUR CLEAR 07/31/2015 0154   LABSPEC 1.007 07/31/2015 0154   PHURINE 6.0 07/31/2015 0154   GLUCOSEU NEGATIVE 07/31/2015 0154   HGBUR MODERATE (A) 07/31/2015 0154  BILIRUBINUR NEGATIVE 07/31/2015 0154   KETONESUR NEGATIVE 07/31/2015 0154   PROTEINUR NEGATIVE 07/31/2015 0154   UROBILINOGEN 4.0 (H) 03/09/2010 1245   NITRITE NEGATIVE 07/31/2015 0154   LEUKOCYTESUR TRACE (A) 07/31/2015 0154      Syanne Looney M.D. [facility] 08/05/2017, 11:19 AM  Pager: [phone] Between 7am to 7pm - call Pager - [phone]  After 7pm go to [email or web address] - password [email or web address]  Call night coverage person covering after 7pm

## 2017-08-06 ENCOUNTER — Inpatient Hospital Stay (HOSPITAL_COMMUNITY): Payer: Medicaid Other

## 2017-08-06 DIAGNOSIS — M1711 Unilateral primary osteoarthritis, right knee: Secondary | ICD-10-CM

## 2017-08-06 DIAGNOSIS — J181 Lobar pneumonia, unspecified organism: Secondary | ICD-10-CM

## 2017-08-06 DIAGNOSIS — I5033 Acute on chronic diastolic (congestive) heart failure: Principal | ICD-10-CM

## 2017-08-06 DIAGNOSIS — I361 Nonrheumatic tricuspid (valve) insufficiency: Secondary | ICD-10-CM

## 2017-08-06 DIAGNOSIS — D649 Anemia, unspecified: Secondary | ICD-10-CM

## 2017-08-06 DIAGNOSIS — I2510 Atherosclerotic heart disease of native coronary artery without angina pectoris: Secondary | ICD-10-CM

## 2017-08-06 DIAGNOSIS — R52 Pain, unspecified: Secondary | ICD-10-CM

## 2017-08-06 LAB — BASIC METABOLIC PANEL
Anion gap: 8 (ref 5–15)
BUN: 6 mg/dL (ref 6–20)
CHLORIDE: 101 mmol/L (ref 101–111)
CO2: 30 mmol/L (ref 22–32)
CREATININE: 0.47 mg/dL (ref 0.44–1.00)
Calcium: 8.4 mg/dL — ABNORMAL LOW (ref 8.9–10.3)
GFR calc non Af Amer: >60 mL/min (ref >60)
GLUCOSE: 96 mg/dL (ref 65–99)
Potassium: 4.3 mmol/L (ref 3.5–5.1)
Sodium: 139 mmol/L (ref 135–145)

## 2017-08-06 LAB — CBC
HCT: 30.3 % — ABNORMAL LOW (ref 36.0–46.0)
HEMOGLOBIN: 8.1 g/dL — AB (ref 12.0–15.0)
MCH: 22.9 pg — ABNORMAL LOW (ref 26.0–34.0)
MCHC: 26.7 g/dL — AB (ref 30.0–36.0)
MCV: 85.8 fL (ref 78.0–100.0)
Platelets: 512 10*3/uL — ABNORMAL HIGH (ref 150–400)
RBC: 3.53 MIL/uL — ABNORMAL LOW (ref 3.87–5.11)
RDW: 23.9 % — ABNORMAL HIGH (ref 11.5–15.5)
WBC: 5.5 10*3/uL (ref 4.0–10.5)

## 2017-08-06 LAB — ECHOCARDIOGRAM COMPLETE
HEIGHTINCHES: 57 in
Weight: 5737.6 oz

## 2017-08-06 LAB — D-DIMER, QUANTITATIVE (NOT AT ARMC): D DIMER QUANT: 4.99 ug{FEU}/mL — AB (ref 0.00–0.50)

## 2017-08-06 LAB — HAPTOGLOBIN: HAPTOGLOBIN: 222 mg/dL — AB (ref 34–200)

## 2017-08-06 MED ORDER — VITAMIN B-12 1000 MCG PO TABS: 1000.0000 ug | ORAL_TABLET | Freq: Every day | ORAL | 3 refills | Status: DC

## 2017-08-06 MED ORDER — AZITHROMYCIN 500 MG PO TABS: 500.0000 mg | ORAL_TABLET | Freq: Every day | ORAL | 0 refills | Status: DC

## 2017-08-06 MED ORDER — DICLOFENAC SODIUM 1 % TD GEL: 2.0000 g | Freq: Three times a day (TID) | TRANSDERMAL | 3 refills | Status: DC

## 2017-08-06 MED ORDER — DICLOFENAC SODIUM 1 % TD GEL
2.0000 g | Freq: Three times a day (TID) | TRANSDERMAL | Status: DC
  Administered 2017-08-06 – 2017-08-09 (×7): 2 g via TOPICAL
  Filled 2017-08-06 (×2): qty 100

## 2017-08-06 MED ORDER — METHYLPREDNISOLONE ACETATE 80 MG/ML IJ SUSP
80.0000 mg | Freq: Once | INTRAMUSCULAR | Status: DC
  Filled 2017-08-06: qty 1

## 2017-08-06 MED ORDER — CHOLECALCIFEROL 250 MCG (10000 UT) PO TABS: 10000.0000 [IU] | ORAL_TABLET | Freq: Every day | ORAL | 3 refills | Status: AC

## 2017-08-06 MED ORDER — FUROSEMIDE 10 MG/ML IJ SOLN
40.0000 mg | Freq: Two times a day (BID) | INTRAMUSCULAR | Status: DC
  Administered 2017-08-06 – 2017-08-08 (×5): 40 mg via INTRAVENOUS
  Filled 2017-08-06 (×6): qty 4

## 2017-08-06 MED ORDER — FUROSEMIDE 40 MG PO TABS: 40.0000 mg | ORAL_TABLET | Freq: Two times a day (BID) | ORAL | 3 refills | Status: DC

## 2017-08-06 MED ORDER — LIDOCAINE HCL 1 % IJ SOLN
10.0000 mL | Freq: Once | INTRAMUSCULAR | Status: DC
  Filled 2017-08-06: qty 10

## 2017-08-06 MED ORDER — TRAMADOL HCL 50 MG PO TABS: 50.0000 mg | ORAL_TABLET | Freq: Four times a day (QID) | ORAL | 0 refills | Status: DC | PRN

## 2017-08-06 MED ORDER — POTASSIUM CHLORIDE CRYS ER 20 MEQ PO TBCR: 20.0000 meq | EXTENDED_RELEASE_TABLET | Freq: Every day | ORAL | 3 refills | Status: DC

## 2017-08-06 MED ORDER — CEFUROXIME AXETIL 500 MG PO TABS: 500.0000 mg | ORAL_TABLET | Freq: Two times a day (BID) | ORAL | 0 refills | Status: DC

## 2017-08-06 NOTE — Plan of Care (Signed)
Patient was updated on current care plan. She verbalizes understanding.

## 2017-08-06 NOTE — Progress Notes (Signed)
Physical Therapy Treatment Note  SATURATION QUALIFICATIONS: (This note is used to comply with regulatory documentation for home oxygen)  Patient Saturations on Room Air at Rest = 90%  Patient Saturations on Room Air while Ambulating = 84%  Patient Saturations on 2 Liters of oxygen while Ambulating = 93%  Please briefly explain why patient needs home oxygen: Patient requires supplemental oxygen to maintain oxygen saturations at acceptable, safe levels with physical activity.  Van Clines, [place]  Acute Rehabilitation Services Pager [phone] Office [phone]

## 2017-08-06 NOTE — Evaluation (Signed)
Physical Therapy Evaluation Patient Details Name: Dana Abbott MRN: [id number] DOB: [birth year] Today's Date: 08/06/2017   History of Present Illness  Admitted with SOB/chest tightness, pneumonia and anemia; mobility complicated by R knee pain (fell on R knee prior to admission), had a steroid shot;  has a past medical history of Anemia, Arthritis, CHF (congestive heart failure) (HCC) (?2003), DVT (deep venous thrombosis) (HCC) ("after my heart attack"), Heavy menses, History of blood transfusion ("I've had alot"), Myocardial infarction (HCC) (2003?), OSA (obstructive sleep apnea) (2003), and Stroke (HCC) (?2003).  Clinical Impression   Pt admitted with above diagnosis. Pt currently with functional limitations due to the deficits listed below (see PT Problem List). Dana Abbott is extremely motivated to get better and stronger to go home; she has had good outcomes from previous stay at [facility]; Today she show functional dependencies, needing mod assist for sit to stand and ambulation; she is young and motivated, and it is worth considering CIR for post-acute rehab; Will place CIR screen;  Pt will benefit from skilled PT to increase their independence and safety with mobility to allow discharge to the venue listed below.       Follow Up Recommendations Other (comment)(Post-acute Rehab)    Equipment Recommendations  (to be determined)    Recommendations for Other Services OT consult(ordered per protocol)     Precautions / Restrictions Precautions Precautions: Fall Precaution Comments: monitor O2 sats, desatted on Room Air      Mobility  Bed Mobility                  Transfers Overall transfer level: Needs assistance Equipment used: Rolling walker (2 wheeled) Transfers: Sit to/from Stand Sit to Stand: +2 safety/equipment;Mod assist         General transfer comment: Dependent on momentum, with significant trunk flexion to get weight over feet; initial stand unable to get knees  fully extended and had to sit back to bed; bil support; Mod assist to steady and power up  Ambulation/Gait Ambulation/Gait assistance: Mod assist;+2 safety/equipment(Very close chair follow) Ambulation Distance (Feet): 15 Feet Assistive device: Rolling walker (2 wheeled) Gait Pattern/deviations: Decreased step length - right;Decreased step length - left;Shuffle     General Gait Details: very close chair follow for safety; very motivated to get to bathroom; at one point, difficulty stepping LLE while turning to sit on commode, requiring mod assist  Stairs            Wheelchair Mobility    Modified Rankin (Stroke Patients Only)       Balance                                             Pertinent Vitals/Pain Pain Assessment: 0-10 Pain Score: 4  Pain Location: describes chronic "everyday" pain Pain Descriptors / Indicators: Aching Pain Intervention(s): Monitored during session    Home Living Family/patient expects to be discharged to:: Private residence Living Arrangements: Alone Available Help at Discharge: Family;Available PRN/intermittently(Daughter lives near; pt helps give care for 61 yo grandson) Type of Home: Apartment Home Access: Level entry     Home Layout: One level        Prior Function Level of Independence: Independent;Independent with assistive device(s)         Comments: Rolling walker for amb; drives, cares fo rgrandson     Hand Dominance  Extremity/Trunk Assessment   Upper Extremity Assessment Upper Extremity Assessment: Generalized weakness    Lower Extremity Assessment Lower Extremity Assessment: Generalized weakness(decr hip and knee ROM limited by body habitus)       Communication   Communication: No difficulties  Cognition Arousal/Alertness: Awake/alert Behavior During Therapy: WFL for tasks assessed/performed Overall Cognitive Status: Within Functional Limits for tasks assessed                                         General Comments General comments (skin integrity, edema, etc.): see other PT note of this date for O2 sats    Exercises     Assessment/Plan    PT Assessment Patient needs continued PT services  PT Problem List Decreased strength;Decreased range of motion;Decreased activity tolerance;Decreased balance;Decreased mobility;Decreased coordination;Decreased knowledge of use of DME;Decreased knowledge of precautions;Cardiopulmonary status limiting activity;Obesity;Pain       PT Treatment Interventions DME instruction;Gait training;Functional mobility training;Therapeutic activities;Therapeutic exercise;Balance training;Neuromuscular re-education;Patient/family education    PT Goals (Current goals can be found in the Care Plan section)  Acute Rehab PT Goals Patient Stated Goal: to go home PT Goal Formulation: With patient Time For Goal Achievement: 08/20/17 Potential to Achieve Goals: Good    Frequency Min 3X/week   Barriers to discharge Decreased caregiver support      Co-evaluation               AM-PAC PT "6 Clicks" Daily Activity  Outcome Measure Difficulty turning over in bed (including adjusting bedclothes, sheets and blankets)?: A Lot Difficulty moving from lying on back to sitting on the side of the bed? : A Lot Difficulty sitting down on and standing up from a chair with arms (e.g., wheelchair, bedside commode, etc,.)?: Unable Help needed moving to and from a bed to chair (including a wheelchair)?: A Lot Help needed walking in hospital room?: A Lot Help needed climbing 3-5 steps with a railing? : Total 6 Click Score: 10    End of Session Equipment Utilized During Treatment: Gait belt;Oxygen Activity Tolerance: Patient limited by fatigue Patient left: in chair;with call bell/phone within reach Nurse Communication: Mobility status PT Visit Diagnosis: Unsteadiness on feet (R26.81);Other abnormalities of gait and mobility  (R26.89);Muscle weakness (generalized) (M62.81);Pain Pain - Right/Left: Right Pain - part of body: Knee    Time: 1114-1150 PT Time Calculation (min) (ACUTE ONLY): 36 min   Charges:   PT Evaluation $PT Eval Moderate Complexity: 1 Mod PT Treatments $Gait Training: 8-22 mins   PT G Codes:        Van Clines, PT  Acute Rehabilitation Services Pager [phone] Office [phone]   Levi Aland 08/06/2017, 12:51 PM

## 2017-08-06 NOTE — Consult Note (Addendum)
Reason for Consult:  Right knee pain Referring Physician:  Hospitalist  Dana Abbott is an 52 y.o. female.  HPI: 52 year old female with past medical history as below including CHF, history of CAD, who presented to the ED with chest pain shortness of breath.  The patient states that her symptoms started approximately a week ago.  She says she was walking outside when she tripped and fell onto her right side of her chest as well as hitting her right knee.  She had difficulty getting up for approximately 2 hours.  She felt generally weak at the time.  Since then, she is had persistent generalized weakness.  She is also had a bilateral aching, dull, substernal, squeezing chest pain.  She said worsening dyspnea on exertion.  She also had increasing lower extremity edema and early satiety.  She is also noticed that she has been coughing more than usual.  She has begun to produce yellow-green sputum over the last day or 2.  She said subjective fevers and chills.  She said night sweats.  No alleviating factors.  No recent sick contacts.  She does not feel like she hit her head or anything else during the fall.  It was also noted that she had significant pain in the right knee and couldn't allow anyone to touch it at first.  THe knee pain while in the hospital has slowly improved, but still quite painful.  X-rays revealed no fractures, but severe osteoarthritis in the right knee, particularly in the medial knee compartment.  Options were discussed with the patient.  Aspiration was not attempted as she had a significant amount of tissue around the knee and I could palpate the area without my sign of effusion. Risks, benefits and expectations were discussed with the patient.   Patient understand the risks, benefits and expectations and wishes to proceed with the injection.    Past Medical History:  Diagnosis Date  . Anemia    Reportedly from heavy menses and pernicious anemia  . Arthritis    "knees" (07/31/2015)   . CHF (congestive heart failure) ([place]) ?2003  . DVT (deep venous thrombosis) ([place]) "after my heart attack"   "one of my legs"  . Heavy menses   . History of blood transfusion "I've had alot"   "I'm anemic"  . Myocardial infarction [facility]) 2003?  . OSA (obstructive sleep apnea) 2003  . Stroke [facility]) ?2003    Past Surgical History:  Procedure Laterality Date  . CARPAL TUNNEL RELEASE Right   . [place]; 1991; 1993  . CHOLECYSTECTOMY  2004   w/gastric OR  . GASTRIC BYPASS  2004  . TONSILLECTOMY      Family History  Problem Relation Age of Onset  . Stroke Mother   . Hypertension Mother   . COPD Father   . Diabetes type II Daughter   . Cancer Neg Hx     Social History:  reports that she quit smoking about 26 years ago. Her smoking use included cigarettes. She started smoking about 29 years ago. She has a 2.00 pack-year smoking history. She has never used smokeless tobacco. She reports that she does not drink alcohol or use drugs.  Allergies:  Allergies  Allergen Reactions  . Shellfish Allergy     Rash       Results for orders placed or performed during the hospital encounter of 08/03/17 (from the past 48 hour(s))  CBC with Differential/Platelet     Status: Abnormal   Collection Time:  08/05/17  1:45 AM  Result Value Ref Range   WBC 5.4 4.0 - 10.5 K/uL   RBC 3.62 (L) 3.87 - 5.11 MIL/uL   Hemoglobin 8.3 (L) 12.0 - 15.0 g/dL   HCT 29.8 (L) 36.0 - 46.0 %   MCV 82.3 78.0 - 100.0 fL   MCH 22.9 (L) 26.0 - 34.0 pg   MCHC 27.9 (L) 30.0 - 36.0 g/dL   RDW 24.1 (H) 11.5 - 15.5 %   Platelets 493 (H) 150 - 400 K/uL    Comment: PLATELET COUNT CONFIRMED BY SMEAR   Neutrophils Relative % 61 %   Lymphocytes Relative 30 %   Monocytes Relative 6 %   Eosinophils Relative 3 %   Basophils Relative 0 %   Neutro Abs 3.3 1.7 - 7.7 K/uL   Lymphs Abs 1.6 0.7 - 4.0 K/uL   Monocytes Absolute 0.3 0.1 - 1.0 K/uL   Eosinophils Absolute 0.2 0.0 - 0.7 K/uL   Basophils Absolute 0.0  0.0 - 0.1 K/uL   RBC Morphology STOMATOCYTES     Comment: Performed at [facility], [address], [place], [place] [address]  Vitamin B12     Status: Abnormal   Collection Time: 08/05/17  6:56 AM  Result Value Ref Range   Vitamin B-12 130 (L) 180 - 914 pg/mL    Comment: (NOTE) This assay is not validated for testing neonatal or myeloproliferative syndrome specimens for Vitamin B12 levels. Performed at [facility], [place] [address] [place], [place] [address]   Folate     Status: None   Collection Time: 08/05/17  6:56 AM  Result Value Ref Range   Folate 10.6 >5.9 ng/mL    Comment: Performed at [facility] [address], [place], [place] [address]  Iron and TIBC     Status: Abnormal   Collection Time: 08/05/17  6:56 AM  Result Value Ref Range   Iron 18 (L) 28 - 170 ug/dL   TIBC 410 250 - 450 ug/dL   Saturation Ratios 4 (L) 10.4 - 31.8 %   UIBC 392 ug/dL    Comment: Performed at [facility], [place] [address], [place], [place] [address]  Ferritin     Status: Abnormal   Collection Time: 08/05/17  6:56 AM  Result Value Ref Range   Ferritin 5 (L) 11 - 307 ng/mL    Comment: Performed at [facility], [place] [address] [place], [place] [address]  Reticulocytes     Status: Abnormal   Collection Time: 08/05/17  6:56 AM  Result Value Ref Range   Retic Ct Pct 2.2 0.4 - 3.1 %   RBC. 3.40 (L) 3.87 - 5.11 MIL/uL   Retic Count, Absolute 74.8 19.0 - 186.0 K/uL    Comment: Performed at [facility] [address] [place], [place] [address]  Lactate dehydrogenase     Status: Abnormal   Collection Time: 08/05/17  6:56 AM  Result Value Ref Range   LDH 215 (H) 98 - 192 U/L    Comment: Performed at [facility] [address] [place] [address]  CBC     Status: Abnormal   Collection Time: 08/06/17  2:38 AM  Result Value Ref Range   WBC 5.5 4.0 - 10.5 K/uL   RBC 3.53 (L) 3.87 - 5.11 MIL/uL   Hemoglobin 8.1 (L) 12.0 - 15.0 g/dL   HCT 30.3  (L) 36.0 - 46.0 %  MCV 85.8 78.0 - 100.0 fL   MCH 22.9 (L) 26.0 - 34.0 pg   MCHC 26.7 (L) 30.0 - 36.0 g/dL   RDW 23.9 (H) 11.5 - 15.5 %   Platelets 512 (H) 150 - 400 K/uL    Comment: Performed at [facility] [address], [place], [place] [address]  Basic metabolic panel     Status: Abnormal   Collection Time: 08/06/17  2:38 AM  Result Value Ref Range   Sodium 139 135 - 145 mmol/L   Potassium 4.3 3.5 - 5.1 mmol/L   Chloride 101 101 - 111 mmol/L   CO2 30 22 - 32 mmol/L   Glucose, Bld 96 65 - 99 mg/dL   BUN 6 6 - 20 mg/dL   Creatinine, Ser 0.47 0.44 - 1.00 mg/dL   Calcium 8.4 (L) 8.9 - 10.3 mg/dL   GFR calc non Af Amer >60 >60 mL/min   GFR calc Af Amer >60 >60 mL/min    Comment: (NOTE) The eGFR has been calculated using the CKD EPI equation. This calculation has not been validated in all clinical situations. eGFR's persistently <60 mL/min signify possible Chronic Kidney Disease.    Anion gap 8 5 - 15    Comment: Performed at [facility] [address], [place], [place] [address]    Dg Knee 3 View Right  Result Date: 08/05/2017 CLINICAL DATA:  Recent fall with right knee pain. Pain is along the anterior surface. EXAM: RIGHT KNEE - 3 VIEW COMPARISON:  02/09/2016 FINDINGS: Again noted are severe degenerative changes involving the right knee. Severe medial compartment narrowing with flattening and remodeling of the medial femoral condyle. Mild lateral subluxation of the right knee associated with the degenerative disease. Diffuse osteophytosis in the knee. Negative for an acute fracture or dislocation. Probable suprapatellar joint effusion. Bone detail is limited due to body habitus. IMPRESSION: Severe osteoarthritis in the right knee, particularly in the medial knee compartment. Probable joint effusion. No acute bone abnormality. Electronically Signed   By: Markus Daft M.D.   On: 08/05/2017 10:05    Review of Systems  Constitutional: Negative.   HENT: Negative.    Eyes: Negative.   Respiratory: Negative.   Gastrointestinal: Negative.   Genitourinary: Negative.   Musculoskeletal: Positive for joint pain.  Skin: Negative.   Neurological: Negative.   Endo/Heme/Allergies: Negative.   Psychiatric/Behavioral: Negative.    Blood pressure (!) 92/51, pulse 80, temperature 98 F (36.7 C), temperature source Oral, resp. rate 19, height '4\' 9"'  (1.448 m), weight (!) 162.7 kg (358 lb 9.6 oz), SpO2 91 %. Physical Exam  Constitutional: She is oriented to person, place, and time. She appears well-developed.  HENT:  Head: Normocephalic.  Eyes: Pupils are equal, round, and reactive to light.  Neck: Neck supple. No JVD present. No tracheal deviation present. No thyromegaly present.  Cardiovascular: Normal rate, regular rhythm and intact distal pulses.  Respiratory: Effort normal and breath sounds normal. No respiratory distress. She has no wheezes.  GI: Soft. There is no tenderness. There is no guarding.  Musculoskeletal:       Right knee: She exhibits decreased range of motion and bony tenderness. She exhibits no effusion, no ecchymosis, no deformity, no laceration and no erythema. Tenderness found.  Lymphadenopathy:    She has no cervical adenopathy.  Neurological: She is alert and oriented to person, place, and time.  Skin: Skin is warm and dry.  Psychiatric: She has a normal mood and affect.  Assessment/Plan: Severe right knee OA / pain   After discussing various options the patient wishes to proceed with a cortisone injection.  The area was cleaned and prepped.  Then 80 cc of DepoMedrol and 4 cc of lidocaine were injected into the right knee without difficulty.  There was almost no back pressure on the injection making me think that there was not much of an effusion within the knee.  Patient tolerated the procedure well without complication or issue.  I have instructed the patient to take it easy for the rest of the day and ice the knee tonight.  We have  discussed the use of rest, ice and elevation on a regular basis, but especially after increased activity.  She is already on Mobic from her PCP, and we have discussed resuming this medications. At this point there is nothing more we can do for her OA.  We have discussed the need to lose weight and how this alone can change how her knees fell.  She is very receptive to the discussion. I have instructed her to follow up with her PCP.  We have also discussed that we couldn't proceed with any type of surgery unless her BMI was under 40, risks increase especially the risk of infection.        Lucille Passy [facility] 08/06/2017, 12:46 PM

## 2017-08-06 NOTE — Progress Notes (Signed)
  Echocardiogram 2D Echocardiogram has been performed.  Dana Abbott 08/06/2017, 2:28 PM

## 2017-08-06 NOTE — Consult Note (Addendum)
Physical Medicine and Rehabilitation Consult Reason for Consult: Decreased functional mobility Referring Physician: [facility]   HPI: Dana Abbott is a 52 y.o. right-handed female with history of obesity and gastric bypass surgery, diastolic congestive heart failure, CAD/MI, chronic anemia.  Per chart review and patient, patient lives alone.  Independent with assistive device.  Driving short distances.  She does take care of her grandson.  One level apartment.  History taken from chart review and patient. Presented 08/03/2017 with shortness of breath times 1 week as well as nonspecific squeezing of the chest complicated by recent fall landing on the right knee.  Chest x-ray showed new patchy opacities left lower lobe, atelectasis versus infiltrate.  Stable cardiomegaly with worsening perihilar edema.  X-rays of the right knee showed severe osteoarthritis particularly in the medial knee compartment with probable joint effusion no acute bone abnormality.  Troponin negative.  Noted hemoglobin 7.3 felt to be chronic and she did receive 1 unit packed red blood cells..  Placed on intravenous Rocephin as well as Zithromax.  Echocardiogram is pending.  Orthopedic service follow-up for severe osteoarthritis of the right knee receive cortisone injection 08/06/2017.   Review of Systems  Constitutional: Positive for malaise/fatigue. Negative for chills and fever.  HENT: Negative for hearing loss.   Eyes: Negative for blurred vision and double vision.  Respiratory: Positive for shortness of breath.   Cardiovascular: Negative for palpitations.  Gastrointestinal: Positive for constipation. Negative for nausea and vomiting.  Genitourinary: Negative for dysuria, flank pain and hematuria.  Musculoskeletal: Positive for joint pain and myalgias.  Skin: Negative for rash.  Neurological: Negative for seizures.  All other systems reviewed and are negative.  Past Medical History:  Diagnosis Date  . Anemia    Reportedly from heavy menses and pernicious anemia  . Arthritis    "knees" (07/31/2015)  . CHF (congestive heart failure) ([facility]) ?2003  . DVT (deep venous thrombosis) ([facility]) "after my heart attack"   "one of my legs"  . Heavy menses   . History of blood transfusion "I've had alot"   "I'm anemic"  . Myocardial infarction [facility]) 2003?  . OSA (obstructive sleep apnea) 2003  . Stroke [facility]) ?2003   Past Surgical History:  Procedure Laterality Date  . CARPAL TUNNEL RELEASE Right   . CESAREAN SECTION  1985; 1991; 1993  . CHOLECYSTECTOMY  2004   w/gastric OR  . GASTRIC BYPASS  2004  . TONSILLECTOMY     Family History  Problem Relation Age of Onset  . Stroke Mother   . Hypertension Mother   . COPD Father   . Diabetes type II Daughter   . Cancer Neg Hx    Social History:  reports that she quit smoking about 26 years ago. Her smoking use included cigarettes. She started smoking about 29 years ago. She has a 2.00 pack-year smoking history. She has never used smokeless tobacco. She reports that she does not drink alcohol or use drugs. Allergies:  Allergies  Allergen Reactions  . Shellfish Allergy     Rash    Medications Prior to Admission  Medication Sig Dispense Refill  . acetaminophen (TYLENOL) 325 MG tablet Take 2 tablets (650 mg total) by mouth every 6 (six) hours as needed for mild pain. (Patient taking differently: Take 975 mg by mouth every 6 (six) hours as needed for mild pain (Taking 3 tabs every 6 hours.). )    . ferrous sulfate 325 (65 FE) MG tablet Take 1 tablet (325  mg total) by mouth 3 (three) times daily with meals. (Patient taking differently: Take 325 mg by mouth 2 (two) times daily with a meal. ) 90 tablet 0  . meloxicam (MOBIC) 15 MG tablet Take 1 tablet (15 mg total) by mouth daily. 30 tablet 4  . [DISCONTINUED] furosemide (LASIX) 40 MG tablet Take 1 tablet (40 mg total) by mouth 2 (two) times daily. 60 tablet 0  . [DISCONTINUED] Cholecalciferol 10000 units TABS Take  10,000 Units by mouth daily. (Patient not taking: Reported on 08/03/2017) 60 tablet 0  . [DISCONTINUED] potassium chloride SA (K-DUR,KLOR-CON) 20 MEQ tablet Take 1 tablet (20 mEq total) by mouth daily. 30 tablet 0  . [DISCONTINUED] vitamin B-12 (CYANOCOBALAMIN) 100 MCG tablet Take 1 tablet (100 mcg total) by mouth 2 (two) times daily. (Patient not taking: Reported on 11/02/2015) 60 tablet 0    Home: Home Living Family/patient expects to be discharged to:: Private residence Living Arrangements: Alone Available Help at Discharge: Family, Available PRN/intermittently(Daughter lives near; pt helps give care for 103 yo grandson) Type of Home: Apartment Home Access: Level entry Home Layout: One level  Functional History: Prior Function Level of Independence: Independent, Independent with assistive device(s) Comments: Rolling walker for amb; drives, cares fo rgrandson Functional Status:  Mobility:   Transfers Overall transfer level: Needs assistance Equipment used: Rolling walker (2 wheeled) Transfers: Sit to/from Stand Sit to Stand: +2 safety/equipment, Mod assist General transfer comment: Dependent on momentum, with significant trunk flexion to get weight over feet; initial stand unable to get knees fully extended and had to sit back to bed; bil support; Mod assist to steady and power up Ambulation/Gait Ambulation/Gait assistance: Mod assist, +2 safety/equipment(Very close chair follow) Ambulation Distance (Feet): 15 Feet Assistive device: Rolling walker (2 wheeled) Gait Pattern/deviations: Decreased step length - right, Decreased step length - left, Shuffle General Gait Details: very close chair follow for safety; very motivated to get to bathroom; at one point, difficulty stepping LLE while turning to sit on commode, requiring mod assist    ADL:    Cognition: Cognition Overall Cognitive Status: Within Functional Limits for tasks assessed Orientation Level: Oriented  X4 Cognition Arousal/Alertness: Awake/alert Behavior During Therapy: WFL for tasks assessed/performed Overall Cognitive Status: Within Functional Limits for tasks assessed  Blood pressure (!) 92/51, pulse 80, temperature 98 F (36.7 C), temperature source Oral, resp. rate 19, height 4\' 9"  (1.448 m), weight (!) 162.7 kg (358 lb 9.6 oz), SpO2 91 %. Physical Exam  Vitals reviewed. Constitutional: She is oriented to person, place, and time. She appears well-developed.  52 year old morbidly obese female  HENT:  Head: Normocephalic and atraumatic.  Eyes: EOM are normal. Right eye exhibits no discharge. Left eye exhibits no discharge.  Neck: Normal range of motion. Neck supple. No thyromegaly present.  Cardiovascular: Normal rate, regular rhythm and normal heart sounds.  Respiratory:  Fair inspiratory effort.  Clear to auscultation +[place]  GI: Soft. Bowel sounds are normal. She exhibits no distension.  Musculoskeletal:  Lymphedema LE  Neurological: She is alert and oriented to person, place, and time.  Motor: B/l UE 4/5 proximal to distal B/l LE: HF, KE 2+/5, ADF 4-/5 (right weaker than left - pain inhibition) Sensation intact to light touch  Skin: Skin is warm and dry.  Psychiatric: She has a normal mood and affect. Her behavior is normal.    Results for orders placed or performed during the hospital encounter of 08/03/17 (from the past 24 hour(s))  CBC     Status:  Abnormal   Collection Time: 08/06/17  2:38 AM  Result Value Ref Range   WBC 5.5 4.0 - 10.5 K/uL   RBC 3.53 (L) 3.87 - 5.11 MIL/uL   Hemoglobin 8.1 (L) 12.0 - 15.0 g/dL   HCT 16.1 (L) 09.6 - 04.5 %   MCV 85.8 78.0 - 100.0 fL   MCH 22.9 (L) 26.0 - 34.0 pg   MCHC 26.7 (L) 30.0 - 36.0 g/dL   RDW 40.9 (H) 81.1 - 91.4 %   Platelets 512 (H) 150 - 400 K/uL  Basic metabolic panel     Status: Abnormal   Collection Time: 08/06/17  2:38 AM  Result Value Ref Range   Sodium 139 135 - 145 mmol/L   Potassium 4.3 3.5 - 5.1 mmol/L    Chloride 101 101 - 111 mmol/L   CO2 30 22 - 32 mmol/L   Glucose, Bld 96 65 - 99 mg/dL   BUN 6 6 - 20 mg/dL   Creatinine, Ser 7.82 0.44 - 1.00 mg/dL   Calcium 8.4 (L) 8.9 - 10.3 mg/dL   GFR calc non Af Amer >60 >60 mL/min   GFR calc Af Amer >60 >60 mL/min   Anion gap 8 5 - 15   Dg Knee 3 View Right  Result Date: 08/05/2017 CLINICAL DATA:  Recent fall with right knee pain. Pain is along the anterior surface. EXAM: RIGHT KNEE - 3 VIEW COMPARISON:  02/09/2016 FINDINGS: Again noted are severe degenerative changes involving the right knee. Severe medial compartment narrowing with flattening and remodeling of the medial femoral condyle. Mild lateral subluxation of the right knee associated with the degenerative disease. Diffuse osteophytosis in the knee. Negative for an acute fracture or dislocation. Probable suprapatellar joint effusion. Bone detail is limited due to body habitus. IMPRESSION: Severe osteoarthritis in the right knee, particularly in the medial knee compartment. Probable joint effusion. No acute bone abnormality. Electronically Signed   By: Richarda Overlie M.D.   On: 08/05/2017 10:05    Assessment/Plan: Diagnosis: Debility Labs independently reviewed.  Records reviewed and summated above.  1. Does the need for close, 24 hr/day medical supervision in concert with the patient's rehab needs make it unreasonable for this patient to be served in a less intensive setting? Potentially  2. Co-Morbidities requiring supervision/potential complications: acute on chronic blood loss anemia (transfuse if necessary to ensure appropriate perfusion for increased activity tolerance), osteoarthritis right knee (s/p steroid injection), super super obesity (Body mass index is 77.6 kg/m.), diet and exercise education, encourage weight loss to increase endurance and promote overall health, gastric bypass surgery, diastolic congestive heart failure (monitor for signs/symptoms of fluid overload), CAD/MI (cont  meds) 3. Due to safety, disease management and patient education, does the patient require 24 hr/day rehab nursing? Potentially 4. Does the patient require coordinated care of a physician, rehab nurse, PT (1-2 hrs/day, 5 days/week) and OT (1-2 hrs/day, 5 days/week) to address physical and functional deficits in the context of the above medical diagnosis(es)? Potentially Addressing deficits in the following areas: balance, endurance, locomotion, strength, transferring, bathing, dressing, toileting and psychosocial support 5. Can the patient actively participate in an intensive therapy program of at least 3 hrs of therapy per day at least 5 days per week? Yes 6. The potential for patient to make measurable gains while on inpatient rehab is excellent 7. Anticipated functional outcomes upon discharge from inpatient rehab are supervision  with PT, supervision with OT, n/a with SLP. 8. Estimated rehab length of stay to reach  the above functional goals is: 10-15 days. 9. Anticipated D/C setting: Home 10. Anticipated post D/C treatments: HH therapy and Home excercise program 11. Overall Rehab/Functional Prognosis: good  RECOMMENDATIONS: This patient's condition is appropriate for continued rehabilitative care in the following setting: Recommend CIR, however, patient is unsure if she would like to stay in the hospital for CIR.  She states she would like to "evaluate my options" after reeval by therapies.  She believes she did not do as well as she could have because she was in bed for so many days. Patient has agreed to participate in recommended program. Potentially Note that insurance prior authorization may be required for reimbursement for recommended care.  Comment: Rehab Admissions Coordinator to follow up.  Maryla Morrow, MD, ABPMR Mcarthur Rossetti Angiulli, PA-C 08/06/2017

## 2017-08-06 NOTE — Progress Notes (Addendum)
[facility]                                                                              Patient Demographics  Dana Abbott, is a 52 y.o. female, DOB - [birth year], EPP:[id number]  Admit date - 08/03/2017   Admitting Physician Haydee Salter, MD  Outpatient Primary MD for the patient is [name], MD  Outpatient specialists:   LOS - 2  days   Medical records reviewed and are as summarized below:    Chief Complaint  Patient presents with  . Chest Pain  . Shortness of Breath       Brief summary   52 y.o.femalepast history significant for CHF, CAD presents with shortness of breath. Symptoms started 1 week ago he has bilateral aching squeezing the chest. Worse with exertion, no chest pain. Has been coughing. No melena on DREand Guiac neg perEDP.  She also reported that she has a history of chronic iron deficiency anemia/pernicious anemia and heavy menstrual bleed for many years, from the multiple abdominal surgeries she had including the gastric bypass     Assessment & Plan    Principal problem Acute on chronic symptomatic anemia, iron deficiency and B12 deficiency -Patient has a history of gastric bypass surgery and states she has not been taking iron supplementation and B12 supplementation consistently -Hemoglobin 7.1 at the time of admission, received 1 unit packed RBC transfusion. B12 130 low, iron profile consistent with iron deficiency -Continue IM B12 supplementation until discharged and then place on 1000 mcg daily, IV Feraheme x1 given on 4/14  Active problems    Acute on chronic respiratory failure sec to Community acquired pneumonia of left lower lobe of lung (HCC) -Currently stable, continue IV Rocephin, Zithromax.  Transition to oral antibiotics upon discharge.  Acute on chronic diastolic CHF, underlying chronic respiratory failure -Previous echo in 2017 showed normal EF 55%, complaining of shortness of breath,  exertional -BNP elevated 264.8, placed on Lasix IV, increase IV Lasix to 40 mg every 12 hours, reassess in a.m. -Negative balance of 693 cc, continue strict I's and O's and daily weights. -Repeat 2D echo pending Addendum: 4:15PM  Echo showed EF 65-70% but severe RV failure, TR, PA pressure 67mm Hg, all new from previous echo, recommending cardiac MRI - consulted cardiology - will check d dimer, if elevated CTA chest to r/o PE. Also likely has obesity hypoventilation.   Severe osteoarthritis and right knee pain -Per patient she fell on her right side prior to admission, right knee x-ray showed severe osteoarthritis in the right knee, particularly in the medial knee compartment, probable joint effusion -Negative FOBT, restart meloxicam -Orthopedics consulted, status post ESI injection for pain.  Unfortunately cannot do anything for severe osteoarthritis/knee replacement until patient has some weight loss. -PT evaluation recommending CIR, consult placed  Morbid obesity - BMI 77.5, counseled strongly on diet and weight control   Code Status: Full CODE STATUS DVT Prophylaxis:  Lovenox  Family Communication: Discussed in detail with the patient, all imaging results, lab results explained to the patient    Disposition Plan: Pending 2D echo and CIR  Time Spent in minutes  25 minutes  Procedures:  ESI by orthopedics right knee Consultants:   Orthopedics  Antimicrobials:   IV Zithromax, IV Rocephin 4/12   Medications  Scheduled Meds: . cyanocobalamin  1,000 mcg Intramuscular Daily  . diclofenac sodium  2 g Topical TID  . ferrous sulfate  325 mg Oral TID WC  . furosemide  40 mg Intravenous Daily  . guaiFENesin  600 mg Oral BID  . lidocaine  10 mL Other Once  . meloxicam  15 mg Oral Daily  . methylPREDNISolone acetate  80 mg Intra-articular Once  . potassium chloride  20 mEq Oral Daily  . sodium chloride flush  3 mL Intravenous Q12H   Continuous Infusions: . cefTRIAXone  (ROCEPHIN)  IV Stopped (08/05/17 2140)   PRN Meds:.acetaminophen **OR** acetaminophen, albuterol, HYDROcodone-acetaminophen, ondansetron **OR** ondansetron (ZOFRAN) IV   Antibiotics   Anti-infectives (From admission, onward)   Start     Dose/Rate Route Frequency Ordered Stop   08/06/17 0000  azithromycin (ZITHROMAX) 500 MG tablet     500 mg Oral Daily 08/06/17 0756 08/11/17 2359   08/06/17 0000  cefUROXime (CEFTIN) 500 MG tablet     500 mg Oral 2 times daily with meals 08/06/17 0756 08/11/17 2359   08/04/17 2200  azithromycin (ZITHROMAX) 500 mg in sodium chloride 0.9 % 250 mL IVPB     500 mg 250 mL/hr over 60 Minutes Intravenous Every 24 hours 08/04/17 0244 08/05/17 2307   08/04/17 2200  cefTRIAXone (ROCEPHIN) 1 g in sodium chloride 0.9 % 100 mL IVPB     1 g 200 mL/hr over 30 Minutes Intravenous Every 24 hours 08/04/17 0244     08/03/17 2100  cefTRIAXone (ROCEPHIN) 1 g in sodium chloride 0.9 % 100 mL IVPB     1 g 200 mL/hr over 30 Minutes Intravenous  Once 08/03/17 2058 08/03/17 2248   08/03/17 2100  azithromycin (ZITHROMAX) 500 mg in sodium chloride 0.9 % 250 mL IVPB     500 mg 250 mL/hr over 60 Minutes Intravenous  Once 08/03/17 2058 08/04/17 0135        Subjective:   Dana Abbott was seen and examined today.  Feeling overall better however states right knee is bothering too much and she may have difficulty with ambulation at home.  Otherwise no chest pain, worsening shortness of breath, nausea, vomiting, abdominal pain.  Objective:   Vitals:   08/05/17 0839 08/05/17 1620 08/06/17 0021 08/06/17 0804  BP: 101/63 100/69 99/67 (!) 92/51  Pulse: 75 71 88 80  Resp: 18 18  19   Temp: 99.4 F (37.4 C) 98.1 F (36.7 C) 98 F (36.7 C)   TempSrc: Oral Oral Oral   SpO2: 96% 94% 92% 91%  Weight:      Height:        Intake/Output Summary (Last 24 hours) at 08/06/2017 1304 Last data filed at 08/06/2017 0400 Gross per 24 hour  Intake 713 ml  Output 750 ml  Net -37 ml      Wt Readings from Last 3 Encounters:  08/04/17 (!) 162.7 kg (358 lb 9.6 oz)  11/02/15 (!) 167.4 kg (369 lb)  10/06/15 (!) 168.8 kg (372 lb 1.6 oz)     Exam   General: Alert and oriented x 3, NAD  Eyes:  HEENT:    Cardiovascular: S1 S2 auscultated, . Regular rate and rhythm. No pedal edema b/l  Respiratory: Clear to auscultation bilaterally, no wheezing, rales or rhonchi  Gastrointestinal: Soft, nontender, nondistended, + bowel sounds  Ext:  no pedal edema bilaterally, right knee tenderness  Neuro: no new deficits  Musculoskeletal: No digital cyanosis, clubbing  Skin: No rashes  Psych: Normal affect and demeanor, alert and oriented x3    Data Reviewed:  I have personally reviewed following labs and imaging studies  Micro Results Recent Results (from the past 240 hour(s))  Blood culture (routine x 2)     Status: None (Preliminary result)   Collection Time: 08/03/17  8:35 PM  Result Value Ref Range Status   Specimen Description BLOOD RIGHT HAND  Final   Special Requests   Final    BOTTLES DRAWN AEROBIC AND ANAEROBIC Blood Culture adequate volume   Culture   Final    NO GROWTH 2 DAYS Performed at [facility], [address], [place], [place] [address]    Report Status PENDING  Incomplete  Blood culture (routine x 2)     Status: None (Preliminary result)   Collection Time: 08/03/17  8:50 PM  Result Value Ref Range Status   Specimen Description BLOOD LEFT WRIST  Final   Special Requests   Final    BOTTLES DRAWN AEROBIC AND ANAEROBIC Blood Culture adequate volume   Culture   Final    NO GROWTH 2 DAYS Performed at [facility], [address] [place], [place] [address]    Report Status PENDING  Incomplete    Radiology Reports Dg Chest 2 View  Result Date: 08/03/2017 CLINICAL DATA:  Chest pain. EXAM: CHEST - 2 VIEW COMPARISON:  Chest x-ray and CT chest dated June 02, 2016. FINDINGS: Stable cardiomegaly. Worsening perihilar interstitial  opacities. New patchy opacities in the left lower lobe. No pleural effusion or pneumothorax. No acute osseous abnormality. IMPRESSION: 1. Stable cardiomegaly with worsening perihilar edema. 2. New patchy opacities in the left lower lobe, atelectasis versus infiltrate. Electronically Signed   By: Obie Dredge M.D.   On: 08/03/2017 19:28   Dg Knee 3 View Right  Result Date: 08/05/2017 CLINICAL DATA:  Recent fall with right knee pain. Pain is along the anterior surface. EXAM: RIGHT KNEE - 3 VIEW COMPARISON:  02/09/2016 FINDINGS: Again noted are severe degenerative changes involving the right knee. Severe medial compartment narrowing with flattening and remodeling of the medial femoral condyle. Mild lateral subluxation of the right knee associated with the degenerative disease. Diffuse osteophytosis in the knee. Negative for an acute fracture or dislocation. Probable suprapatellar joint effusion. Bone detail is limited due to body habitus. IMPRESSION: Severe osteoarthritis in the right knee, particularly in the medial knee compartment. Probable joint effusion. No acute bone abnormality. Electronically Signed   By: Richarda Overlie M.D.   On: 08/05/2017 10:05    Lab Data:  CBC: Recent Labs  Lab 08/03/17 1949 08/04/17 0238 08/04/17 1119 08/05/17 0145 08/06/17 0238  WBC 4.1 4.9  --  5.4 5.5  NEUTROABS  --   --   --  3.3  --   HGB 7.3* 7.1* 7.8* 8.3* 8.1*  HCT 27.3* 27.0* 29.2* 29.8* 30.3*  MCV 82.0 82.3  --  82.3 85.8  PLT 328 355  --  493* 512*   Basic Metabolic Panel: Recent Labs  Lab 08/03/17 1949 08/04/17 0238 08/06/17 0238  NA 140 140 139  K 4.2 3.8 4.3  CL 104 103 101  CO2 28 27 30   GLUCOSE 98 101* 96  BUN 8 5* 6  CREATININE 0.50 0.41* 0.47  CALCIUM 8.1* 8.1* 8.4*   GFR: Estimated Creatinine Clearance: 118.4 mL/min (by  C-G formula based on SCr of 0.47 mg/dL). Liver Function Tests: Recent Labs  Lab 08/03/17 1949  AST 19  ALT 12*  ALKPHOS 80  BILITOT 0.5  PROT 6.7  ALBUMIN  3.0*   No results for input(s): LIPASE, AMYLASE in the last 168 hours. No results for input(s): AMMONIA in the last 168 hours. Coagulation Profile: No results for input(s): INR, PROTIME in the last 168 hours. Cardiac Enzymes: Recent Labs  Lab 08/03/17 1949  TROPONINI <0.03   BNP (last 3 results) No results for input(s): PROBNP in the last 8760 hours. HbA1C: No results for input(s): HGBA1C in the last 72 hours. CBG: No results for input(s): GLUCAP in the last 168 hours. Lipid Profile: No results for input(s): CHOL, HDL, LDLCALC, TRIG, CHOLHDL, LDLDIRECT in the last 72 hours. Thyroid Function Tests: No results for input(s): TSH, T4TOTAL, FREET4, T3FREE, THYROIDAB in the last 72 hours. Anemia Panel: Recent Labs    08/05/17 0656  VITAMINB12 130*  FOLATE 10.6  FERRITIN 5*  TIBC 410  IRON 18*  RETICCTPCT 2.2   Urine analysis:    Component Value Date/Time   COLORURINE YELLOW 07/31/2015 0154   APPEARANCEUR CLEAR 07/31/2015 0154   LABSPEC 1.007 07/31/2015 0154   PHURINE 6.0 07/31/2015 0154   GLUCOSEU NEGATIVE 07/31/2015 0154   HGBUR MODERATE (A) 07/31/2015 0154   BILIRUBINUR NEGATIVE 07/31/2015 0154   KETONESUR NEGATIVE 07/31/2015 0154   PROTEINUR NEGATIVE 07/31/2015 0154   UROBILINOGEN 4.0 (H) 03/09/2010 1245   NITRITE NEGATIVE 07/31/2015 0154   LEUKOCYTESUR TRACE (A) 07/31/2015 0154     Ripudeep Rai M.D. [facility] 08/06/2017, 1:04 PM  Pager: [phone] Between 7am to 7pm - call Pager - [phone]  After 7pm go to [email or web address] - password [email or web address]  Call night coverage person covering after 7pm

## 2017-08-07 ENCOUNTER — Inpatient Hospital Stay (HOSPITAL_COMMUNITY): Payer: Medicaid Other

## 2017-08-07 DIAGNOSIS — R072 Precordial pain: Secondary | ICD-10-CM

## 2017-08-07 DIAGNOSIS — E662 Morbid (severe) obesity with alveolar hypoventilation: Secondary | ICD-10-CM

## 2017-08-07 DIAGNOSIS — I2721 Secondary pulmonary arterial hypertension: Secondary | ICD-10-CM

## 2017-08-07 LAB — BASIC METABOLIC PANEL
ANION GAP: 8 (ref 5–15)
BUN: 5 mg/dL — ABNORMAL LOW (ref 6–20)
CALCIUM: 8.4 mg/dL — AB (ref 8.9–10.3)
CO2: 34 mmol/L — AB (ref 22–32)
CREATININE: 0.45 mg/dL (ref 0.44–1.00)
Chloride: 99 mmol/L — ABNORMAL LOW (ref 101–111)
Glucose, Bld: 102 mg/dL — ABNORMAL HIGH (ref 65–99)
Potassium: 4.1 mmol/L (ref 3.5–5.1)
SODIUM: 141 mmol/L (ref 135–145)

## 2017-08-07 LAB — CBC
HEMATOCRIT: 32 % — AB (ref 36.0–46.0)
Hemoglobin: 8.2 g/dL — ABNORMAL LOW (ref 12.0–15.0)
MCH: 22.1 pg — ABNORMAL LOW (ref 26.0–34.0)
MCHC: 25.6 g/dL — AB (ref 30.0–36.0)
MCV: 86.3 fL (ref 78.0–100.0)
PLATELETS: 591 10*3/uL — AB (ref 150–400)
RBC: 3.71 MIL/uL — ABNORMAL LOW (ref 3.87–5.11)
RDW: 24.6 % — AB (ref 11.5–15.5)
WBC: 7.3 10*3/uL (ref 4.0–10.5)

## 2017-08-07 MED ORDER — SODIUM CHLORIDE 0.9% FLUSH
3.0000 mL | Freq: Two times a day (BID) | INTRAVENOUS | Status: DC
  Administered 2017-08-07 – 2017-08-08 (×2): 3 mL via INTRAVENOUS

## 2017-08-07 MED ORDER — IOPAMIDOL (ISOVUE-370) INJECTION 76%
INTRAVENOUS | Status: AC
  Administered 2017-08-07: 85 mL via INTRAVENOUS
  Filled 2017-08-07: qty 100

## 2017-08-07 MED ORDER — SODIUM CHLORIDE 0.9 % IV SOLN: 250.0000 mL | INTRAVENOUS | Status: DC | PRN

## 2017-08-07 MED ORDER — SODIUM CHLORIDE 0.9 % IV SOLN
INTRAVENOUS | Status: DC
  Administered 2017-08-07: 21:00:00 via INTRAVENOUS

## 2017-08-07 MED ORDER — ASPIRIN 81 MG PO CHEW
81.0000 mg | CHEWABLE_TABLET | ORAL | Status: AC
  Administered 2017-08-08: 81 mg via ORAL
  Filled 2017-08-07: qty 1

## 2017-08-07 MED ORDER — SODIUM CHLORIDE 0.9% FLUSH: 3.0000 mL | INTRAVENOUS | Status: DC | PRN

## 2017-08-07 NOTE — Progress Notes (Signed)
Physical Therapy Treatment Patient Details Name: Dana Abbott MRN: [id number] DOB: 1965-11-05 Today's Date: 08/07/2017    History of Present Illness Admitted with SOB/chest tightness, pneumonia and anemia; mobility complicated by R knee pain (fell on R knee prior to admission), had a steroid shot;  has a past medical history of Anemia, Arthritis, CHF (congestive heart failure) (HCC) (?2003), DVT (deep venous thrombosis) (HCC) ("after my heart attack"), Heavy menses, History of blood transfusion ("I've had alot"), Myocardial infarction (HCC) (2003?), OSA (obstructive sleep apnea) (2003), and Stroke (HCC) (?2003).    PT Comments    Continuing work on functional mobility and activity tolerance;  R knee pain limiting standing tolerance today; plan to coordinate with pain meds for ensuing treatment sessions; Continue to recommend CIR for post-acute therapies; I'm noting that when Dana Abbott sets a goal for herself, she is VERY determined to meet it   Follow Up Recommendations  CIR     Equipment Recommendations  Rolling walker with 5" wheels;3in1 (PT)(Wide and short)    Recommendations for Other Services OT consult     Precautions / Restrictions Precautions Precautions: Fall Precaution Comments: monitor O2 sats, desatted on Room Air    Mobility  Bed Mobility Overal bed mobility: Needs Assistance Bed Mobility: Supine to Sit;Sit to Supine     Supine to sit: Min guard Sit to supine: Mod assist   General bed mobility comments: Minguard for safety with pt coming to sit; reliant on bedrails and momentum for scoot to EOB; VERY inefficient reciprocal scoot backwards, shifts weight laterally, but decr anterior shift of upper trunk to take advantage of head-hips relationship; Mod assist to help LEs into bed  Transfers Overall transfer level: Needs assistance Equipment used: Rolling walker (2 wheeled)(Bari RW on shrotest setting) Transfers: Sit to/from Stand Sit to Stand: +2  safety/equipment;Mod assist         General transfer comment: Dependent on momentum, with significant trunk flexion to get weight over feet; initial stand unable to get knees fully extended and had to sit back to bed; bil support; Mod assist to steady and power up; More painful R knee today, with decr tolerance of standing; 3 attepmpts to stand before successfully standing to RW  Ambulation/Gait             General Gait Details: Unable today; Limited by R knee pain and to go for imaging    Stairs             Wheelchair Mobility    Modified Rankin (Stroke Patients Only)       Balance                                            Cognition Arousal/Alertness: Awake/alert Behavior During Therapy: WFL for tasks assessed/performed Overall Cognitive Status: Within Functional Limits for tasks assessed                                        Exercises      General Comments        Pertinent Vitals/Pain Pain Assessment: 0-10 Pain Score: 10-Worst pain ever Pain Location: R knee Pain Descriptors / Indicators: Aching;Tightness Pain Intervention(s): Monitored during session;Ice applied;Patient requesting pain meds-RN notified    Home Living  Prior Function            PT Goals (current goals can now be found in the care plan section) Acute Rehab PT Goals Patient Stated Goal: to go home PT Goal Formulation: With patient Time For Goal Achievement: 08/20/17 Potential to Achieve Goals: Good Progress towards PT goals: Not progressing toward goals - comment(limited by pain)    Frequency    Min 3X/week      PT Plan Current plan remains appropriate    Co-evaluation              AM-PAC PT "6 Clicks" Daily Activity  Outcome Measure  Difficulty turning over in bed (including adjusting bedclothes, sheets and blankets)?: A Lot Difficulty moving from lying on back to sitting on the side of the  bed? : A Lot Difficulty sitting down on and standing up from a chair with arms (e.g., wheelchair, bedside commode, etc,.)?: Unable Help needed moving to and from a bed to chair (including a wheelchair)?: A Lot Help needed walking in hospital room?: Total Help needed climbing 3-5 steps with a railing? : Total 6 Click Score: 9    End of Session Equipment Utilized During Treatment: Gait belt;Oxygen Activity Tolerance: Patient limited by fatigue;Patient limited by pain Patient left: in bed;with call bell/phone within reach Nurse Communication: Mobility status PT Visit Diagnosis: Unsteadiness on feet (R26.81);Other abnormalities of gait and mobility (R26.89);Muscle weakness (generalized) (M62.81);Pain Pain - Right/Left: Right Pain - part of body: Knee     Time: 1000-1041 PT Time Calculation (min) (ACUTE ONLY): 41 min  Charges:  $Therapeutic Activity: 38-52 mins                    G Codes:       Van Clines, PT  Acute Rehabilitation Services Pager [phone] Office [phone]    Levi Aland 08/07/2017, 11:30 AM

## 2017-08-07 NOTE — Progress Notes (Addendum)
Advanced Heart Failure Team Consult Note   Primary Physician: [name], MD PCP-Cardiologist:  No primary care provider on file.  Reason for Consultation: Heart Failure   HPI:    Dana Abbott is seen today for evaluation of heart failure at the request of Dr Isidoro Donning.   Dana Abbott is a 52 year old with history of anemia, DVT, gastric bypass surgery 2004 - lost 200 pounds but has gained 50 back, MI years ago >10 years ago (no intervention), morbid obesity, and chronic diastolic heart failure. Former smoker. Uses oxygen as needed at night.   About 1 week she was walking up steps and fell. Denies syncope/presycope. Over the next few days she developed chest pain that kept getting worse.   Admitted with chest pain and increased shortness of breath.  CTA negative for PE. She has been diuresing with IV lasix. Also being treated for LLL pneumonia. Currently on rocephin and Zithromycin. Pertinent admission labs include: K 4.3 Creatinine 0.47 Hgb 8.1  WBC 5.5. Troponin < 0.03.   Hypoxic when walking. Sats dropped to 84%. Denies chest pain currently. Says breathoing is better now. At baseline can do ADLs and go shopping but goes slowly. Denies syncope or palpitations. No h/o connective tissue d/o. Marland Kitchen   Echo EF 65-70% Peak PA pressures 81 mm hg RV severely dilated   Review of Systems: [y] = yes, [ ]  = no   General: Weight gain [ ] ; Weight loss [ ] ; Anorexia [ ] ; Fatigue [Y ]; Fever [ ] ; Chills [ ] ; Weakness [ Y]  Cardiac: Chest pain/pressure [ ] ; Resting SOB [ ] ; Exertional SOB [Y ]; Orthopnea [ ] ; Pedal Edema [ ] ; Palpitations [ ] ; Syncope [ ] ; Presyncope [ ] ; Paroxysmal nocturnal dyspnea[ ]   Pulmonary: Cough [ ] ; Wheezing[ ] ; Hemoptysis[ ] ; Sputum [ ] ; Snoring [ ]   GI: Vomiting[ ] ; Dysphagia[ ] ; Melena[ ] ; Hematochezia [ ] ; Heartburn[ ] ; Abdominal pain [ ] ; Constipation [ ] ; Diarrhea [ ] ; BRBPR [ ]   GU: Hematuria[ ] ; Dysuria [ ] ; Nocturia[ ]   Vascular: Pain in legs with walking [ ] ;  Pain in feet with lying flat [ ] ; Non-healing sores [ ] ; Stroke [ ] ; TIA [ ] ; Slurred speech [ ] ;  Neuro: Headaches[ ] ; Vertigo[ ] ; Seizures[ ] ; Paresthesias[ ] ;Blurred vision [ ] ; Diplopia [ ] ; Vision changes [ ]   Ortho/Skin: Arthritis [ ] ; Joint pain [Y ]; Muscle pain [ ] ; Joint swelling [ ] ; Back Pain [Y ]; Rash [ ]   Psych: Depression[ ] ; Anxiety[ ]   Heme: Bleeding problems [ ] ; Clotting disorders [ ] ; Anemia [ ]   Endocrine: Diabetes [ ] ; Thyroid dysfunction[ ]   Home Medications Prior to Admission medications   Medication Sig Start Date End Date Taking? Authorizing Provider  acetaminophen (TYLENOL) 325 MG tablet Take 2 tablets (650 mg total) by mouth every 6 (six) hours as needed for mild pain. Patient taking differently: Take 975 mg by mouth every 6 (six) hours as needed for mild pain (Taking 3 tabs every 6 hours.).  10/05/15  Yes Love, Evlyn Kanner, PA-C  ferrous sulfate 325 (65 FE) MG tablet Take 1 tablet (325 mg total) by mouth 3 (three) times daily with meals. Patient taking differently: Take 325 mg by mouth 2 (two) times daily with a meal.  10/06/15  Yes Love, Evlyn Kanner, PA-C  meloxicam (MOBIC) 15 MG tablet Take 1 tablet (15 mg total) by mouth daily. 11/02/15  Yes [name], MD  azithromycin (ZITHROMAX) 500 MG tablet  Take 1 tablet (500 mg total) by mouth daily for 5 days. 08/06/17 08/11/17  Rai, Delene Ruffini, MD  cefUROXime (CEFTIN) 500 MG tablet Take 1 tablet (500 mg total) by mouth 2 (two) times daily with a meal for 5 days. 08/06/17 08/11/17  Rai, Delene Ruffini, MD  Cholecalciferol 10000 units TABS Take 10,000 Units by mouth daily. 08/06/17   Rai, Delene Ruffini, MD  diclofenac sodium (VOLTAREN) 1 % GEL Apply 2 g topically 3 (three) times daily. Apply to Right knee 08/06/17   Rai, Delene Ruffini, MD  furosemide (LASIX) 40 MG tablet Take 1 tablet (40 mg total) by mouth 2 (two) times daily. 08/06/17   Rai, Delene Ruffini, MD  potassium chloride SA (K-DUR,KLOR-CON) 20 MEQ tablet Take 1 tablet (20 mEq total)  by mouth daily. 08/06/17   Rai, Delene Ruffini, MD  traMADol (ULTRAM) 50 MG tablet Take 1 tablet (50 mg total) by mouth every 6 (six) hours as needed for moderate pain or severe pain. 08/06/17 08/06/18  Rai, Delene Ruffini, MD  vitamin B-12 (CYANOCOBALAMIN) 1000 MCG tablet Take 1 tablet (1,000 mcg total) by mouth daily. 08/06/17   Cathren Harsh, MD    Past Medical History: Past Medical History:  Diagnosis Date  . Anemia    Reportedly from heavy menses and pernicious anemia  . Arthritis    "knees" (07/31/2015)  . CHF (congestive heart failure) (HCC) ?2003  . DVT (deep venous thrombosis) (HCC) "after my heart attack"   "one of my legs"  . Heavy menses   . History of blood transfusion "I've had alot"   "I'm anemic"  . Myocardial infarction [place]) 2003?  . OSA (obstructive sleep apnea) 2003  . Stroke [facility]) ?2003    Past Surgical History: Past Surgical History:  Procedure Laterality Date  . CARPAL TUNNEL RELEASE Right   . CESAREAN SECTION  1985; 1991; 1993  . CHOLECYSTECTOMY  2004   w/gastric OR  . GASTRIC BYPASS  2004  . TONSILLECTOMY      Family History: Family History  Problem Relation Age of Onset  . Stroke Mother   . Hypertension Mother   . COPD Father   . Diabetes type II Daughter   . Cancer Neg Hx     Social History: Social History   Socioeconomic History  . Marital status: Single    Spouse name: Not on file  . Number of children: Not on file  . Years of education: Not on file  . Highest education level: Not on file  Occupational History  . Not on file  Social Needs  . Financial resource strain: Not on file  . Food insecurity:    Worry: Not on file    Inability: Not on file  . Transportation needs:    Medical: Not on file    Non-medical: Not on file  Tobacco Use  . Smoking status: Former Smoker    Packs/day: 1.00    Years: 2.00    Pack years: 2.00    Types: Cigarettes    Start date: 04/24/1988    Last attempt to quit: 04/24/1991    Years since quitting: 26.3    . Smokeless tobacco: Never Used  Substance and Sexual Activity  . Alcohol use: No    Alcohol/week: 0.0 oz    Comment: "I haven't drank in 20 years"  . Drug use: No  . Sexual activity: Not Currently  Lifestyle  . Physical activity:    Days per week: Not on file  Minutes per session: Not on file  . Stress: Not on file  Relationships  . Social connections:    Talks on phone: Not on file    Gets together: Not on file    Attends religious service: Not on file    Active member of club or organization: Not on file    Attends meetings of clubs or organizations: Not on file    Relationship status: Not on file  Other Topics Concern  . Not on file  Social History Narrative  . Not on file    Allergies:  Allergies  Allergen Reactions  . Shellfish Allergy     Rash     Objective:    Vital Signs:   Temp:  [98.1 F (36.7 C)-98.7 F (37.1 C)] 98.1 F (36.7 C) (04/16 0805) Pulse Rate:  [81-85] 81 (04/16 0805) Resp:  [22] 22 (04/16 0805) BP: (106-107)/(50-64) 106/50 (04/16 0805) SpO2:  [93 %-99 %] 93 % (04/16 0805) Last BM Date: 08/04/17  Weight change: Filed Weights   08/04/17 0020 08/04/17 2243  Weight: (!) 357 lb 2.3 oz (162 kg) (!) 358 lb 9.6 oz (162.7 kg)    Intake/Output:   Intake/Output Summary (Last 24 hours) at 08/07/2017 1237 Last data filed at 08/07/2017 1222 Gross per 24 hour  Intake 343 ml  Output 4950 ml  Net -4607 ml      Physical Exam    General: No resp difficulty. In bed.  HEENT: normal Neck: supple. JVP to jaw . Carotids 2+ bilat; no bruits. No lymphadenopathy or thyromegaly appreciated. Cor: PMI nondisplaced. Regular rate & rhythm. +RV lft 2/6 TR loud P2 Lungs: clear on 2 liters  Dull at bases Abdomen: markedly obese,soft, nontender, nondistended. No hepatosplenomegaly. No bruits or masses. Good bowel sounds. Extremities: no cyanosis, clubbing, rash, edema warm  Neuro: alert & oriented x 3, cranial nerves grossly intact. moves all 4  extremities w/o difficulty. Affect pleasant    Telemetry   NSR 80s   EKG    NSR 85 bpm diffuse ST& T wave abnormality (no change) Personally reviewed   Labs   Basic Metabolic Panel: Recent Labs  Lab 08/03/17 1949 08/04/17 0238 08/06/17 0238 08/07/17 0211  NA 140 140 139 141  K 4.2 3.8 4.3 4.1  CL 104 103 101 99*  CO2 28 27 30  34*  GLUCOSE 98 101* 96 102*  BUN 8 5* 6 5*  CREATININE 0.50 0.41* 0.47 0.45  CALCIUM 8.1* 8.1* 8.4* 8.4*    Liver Function Tests: Recent Labs  Lab 08/03/17 1949  AST 19  ALT 12*  ALKPHOS 80  BILITOT 0.5  PROT 6.7  ALBUMIN 3.0*   No results for input(s): LIPASE, AMYLASE in the last 168 hours. No results for input(s): AMMONIA in the last 168 hours.  CBC: Recent Labs  Lab 08/03/17 1949 08/04/17 0238 08/04/17 1119 08/05/17 0145 08/06/17 0238 08/07/17 0211  WBC 4.1 4.9  --  5.4 5.5 7.3  NEUTROABS  --   --   --  3.3  --   --   HGB 7.3* 7.1* 7.8* 8.3* 8.1* 8.2*  HCT 27.3* 27.0* 29.2* 29.8* 30.3* 32.0*  MCV 82.0 82.3  --  82.3 85.8 86.3  PLT 328 355  --  493* 512* 591*    Cardiac Enzymes: Recent Labs  Lab 08/03/17 1949  TROPONINI <0.03    BNP: BNP (last 3 results) Recent Labs    08/03/17 1949  BNP 264.8*    ProBNP (last 3 results) No  results for input(s): PROBNP in the last 8760 hours.   CBG: No results for input(s): GLUCAP in the last 168 hours.  Coagulation Studies: No results for input(s): LABPROT, INR in the last 72 hours.   Imaging   Ct Angio Chest Pe W Or Wo Contrast  Result Date: 08/07/2017 CLINICAL DATA:  Right-sided chest pain EXAM: CT ANGIOGRAPHY CHEST WITH CONTRAST TECHNIQUE: Multidetector CT imaging of the chest was performed using the standard protocol during bolus administration of intravenous contrast. Multiplanar CT image reconstructions and MIPs were obtained to evaluate the vascular anatomy. CONTRAST:  85 mL Isovue 370. COMPARISON:  08/03/2017 FINDINGS: Cardiovascular: No significant  atherosclerotic changes identified. No aneurysmal dilatation of the aorta is seen. The heart is enlarged in size similar to that seen on prior plain film examination. The pulmonary artery shows a normal branching pattern without focal intraluminal filling defect to suggest pulmonary embolism. Main pulmonary artery measures 4 cm in transverse dimension suggestive of pulmonary hypertension. Mediastinum/Nodes: The thoracic inlet is within normal limits. Subcarinal adenopathy is identified which measures 2.4 cm in short axis. Scattered small hilar nodes are noted bilaterally. No other significant lymphadenopathy is seen. Lungs/Pleura: Lungs are well aerated bilaterally. No sizable effusion is noted. Mild left lower lobe atelectatic changes are seen. A mild mosaic attenuation pattern is noted throughout both lungs with mild emphysematous changes identified. This may be related to the underlying enlargement of the pulmonary artery. Upper Abdomen: Visualized upper abdomen is within normal limits. Musculoskeletal: Degenerative changes of the thoracic spine are noted. Review of the MIP images confirms the above findings. IMPRESSION: Changes most consistent with pulmonary hypertension. No evidence of pulmonary emboli. Mosaic attenuation pattern within the lungs bilaterally consistent with the changes of pulmonary hypertension. Hilar and mediastinal lymph nodes of uncertain significance. Possibility of prior granulomatous disease deserves consideration (sarcoid) Electronically Signed   By: Alcide Clever M.D.   On: 08/07/2017 11:45      Medications:     Current Medications: . cyanocobalamin  1,000 mcg Intramuscular Daily  . diclofenac sodium  2 g Topical TID  . ferrous sulfate  325 mg Oral TID WC  . furosemide  40 mg Intravenous Q12H  . guaiFENesin  600 mg Oral BID  . lidocaine  10 mL Other Once  . meloxicam  15 mg Oral Daily  . methylPREDNISolone acetate  80 mg Intra-articular Once  . potassium chloride  20 mEq  Oral Daily  . sodium chloride flush  3 mL Intravenous Q12H     Infusions: . cefTRIAXone (ROCEPHIN)  IV Stopped (08/06/17 2144)       Patient Profile    Dana Abbott is a 52 year old with history of anemia, DVT, gastric bypass surgery 2004 - lost 200 pounds but has gained 50 back, MI years ago >10 years ago (no intervention), morbid obesity, and chronic diastolic heart failure. Former smoker. Uses oxygen as needed at night.  Admitted with chest pain and increased shortness of breath.   Assessment/Plan   1. Chest pain Troponin < 0.03.  Had MI years ago.   2. Symptomatic Anemia - iron and b12 deficiency On iron and b12. Hgb on admit 7.1. FOBT negative.  Received Feraheme this admit.   3. A/C Diastolic Heart Failure--> Severe RV Failure ECHO with elevated filling pressures. EF 60%.  Volume status elevated. Continue to diurese with IV lasix.   4. Acute/Chronic Hypoxic Respiratory Failure secondary to CAP.  On rocephin and zithromycin.  5. Pulmonary Hypertension PA pressures high on  ECHO. 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 CTA- negative for PE Will need RHC to further evaluate.   6 Chronic Hypoxic Respiratory Failure Sats < 88% with ambulation.  Will need to use oxygen with ambulation.  Needs formal sleep study.   7. Mobid Obesity Body mass index is 77.6 kg/m.  Had gastric bypass surgery in 2004. Initially lost 200 pounds but has gained back 50 pounds.   8. Deconditioning PT recommending CIR.   Medication concerns reviewed with patient and pharmacy team. Barriers identified: none   Length of Stay: 3  Amy Clegg, NP  08/07/2017, 12:37 PM  Advanced Heart Failure Team Pager [phone] (M-F; 7a - 4p)  Please contact [facility] for night-coverage after hours (4p -7a ) and weekends on [email or web address]  Patient seen and examined with Tonye Becket, NP. We discussed all aspects of the encounter. I agree with the assessment and plan as stated above.   52 y/o woman with marked  obesity, reported h/o MI, remote h/o VTE and tobacco use admitted with CP and SOB. Troponin negative. ECG non-acute . CT chest no PE. Ech shows normal LV function with dilated RV and evidence of significant PAH and RV strain. RVSP 80-85. Personally reviewed. She is improved with diuresis. Exam notable for morbid obesity and RV lift with 2/6 TR.   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 Given CRFs and reported h/o MI. Will proceed with R/L cath tomorrow to evaluate [facility] and coronary anatomy. Will need ongoing efforts at weight loss. Will draw ABG in cath lab.   I have reviewed the risks, indications, and alternatives to angioplasty and stenting with the patient. Risks include but are not limited to bleeding, infection, vascular injury, stroke, myocardial infection, arrhythmia, kidney injury, radiation-related injury in the case of prolonged fluoroscopy use, emergency cardiac surgery, and death. The patient understands the risks of serious complication is low (<1%) and he agrees to proceed.   Arvilla Meres, MD  10:58 PM

## 2017-08-07 NOTE — Progress Notes (Signed)
Inpatient Rehabilitation  Met with patient at bedside to discuss team's recommendation for IP Rehab.  Shared booklets, insurance verification letter, and answered initial questions.  After reviewing coverage patient is eager for IP Rehab in order to regain her independence.  Plan to follow for timing of medical readiness and IP Rehab bed availability.  Call if questions.    Carmelia Roller., CCC/SLP Admission Coordinator  [place]  Cell [phone]

## 2017-08-07 NOTE — Progress Notes (Signed)
Occupational Therapy Evaluation Patient Details Name: Dana Abbott MRN: [id number] DOB: 28-Sep-1965 Today's Date: 08/07/2017    History of Present Illness Admitted with SOB/chest tightness, pneumonia and anemia; mobility complicated by R knee pain (fell on R knee prior to admission), had a steroid shot;  has a past medical history of Anemia, Arthritis, CHF (congestive heart failure) (HCC) (?2003), DVT (deep venous thrombosis) (HCC) ("after my heart attack"), Heavy menses, History of blood transfusion ("I've had alot"), Myocardial infarction (HCC) (2003?), OSA (obstructive sleep apnea) (2003), and Stroke (HCC) (?2003).   Clinical Impression   PTA, pt modified independent @ rollator level. Pt demonstrates a significant functional decline, requiring mod A +2 for mobility and mod A with LB ADL. Feel pt is a good CIR candidate and could achieve modified independent level goals and DC home with intermittent S. Pt motivated to be independent. Pt with financial questions regarding cost of rehab - deferred to [facility] coordinator. Will follow acutely.     Follow Up Recommendations  CIR;Supervision - Intermittent    Equipment Recommendations  3 in 1 bedside commode    Recommendations for Other Services       Precautions / Restrictions Precautions Precautions: Fall Precaution Comments: monitor O2 sats Restrictions Weight Bearing Restrictions: No      Mobility Bed Mobility Overal bed mobility: Needs Assistance Bed Mobility: Supine to Sit;Sit to Supine     Supine to sit: Min guard Sit to supine: Mod assist   General bed mobility comments: Minguard for safety with pt coming to sit; reliant on bedrails and momentum for scoot to EOB; VERY inefficient reciprocal scoot backwards, shifts weight laterally, but decr anterior shift of upper trunk to take advantage of head-hips relationship; Mod assist to help LEs into bed  Transfers Overall transfer level: Needs assistance Equipment used: Rolling  walker (2 wheeled)(Bari RW on shrotest setting) Transfers: Sit to/from Stand Sit to Stand: +2 safety/equipment;Mod assist         General transfer comment: Dependent on momentum, Painful R knee    Balance Overall balance assessment: History of Falls;Needs assistance   Sitting balance-Leahy Scale: Good       Standing balance-Leahy Scale: Poor Standing balance comment: dependent on support                           ADL either performed or assessed with clinical judgement   ADL Overall ADL's : Needs assistance/impaired     Grooming: Set up;Sitting   Upper Body Bathing: Set up;Sitting   Lower Body Bathing: Moderate assistance;Bed level   Upper Body Dressing : Set up;Sitting   Lower Body Dressing: Moderate assistance;Bed level Lower Body Dressing Details (indicate cue type and reason): rolling side/side Toilet Transfer: +2 for physical assistance;Moderate assistance           Functional mobility during ADLs: Moderate assistance;+2 for physical assistance General ADL Comments: Pt typically leans over to donn/doff socks; may benefi tform use of AE     Vision         Perception     Praxis      Pertinent Vitals/Pain Pain Assessment: Faces Pain Score: 10-Worst pain ever Faces Pain Scale: Hurts even more Pain Location: R knee Pain Descriptors / Indicators: Aching;Tightness Pain Intervention(s): Limited activity within patient's tolerance;Repositioned;Ice applied     Hand Dominance Left   Extremity/Trunk Assessment Upper Extremity Assessment Upper Extremity Assessment: Overall WFL for tasks assessed   Lower Extremity Assessment Lower Extremity Assessment: Defer to  PT evaluation(R knee pain)       Communication Communication Communication: No difficulties   Cognition Arousal/Alertness: Awake/alert Behavior During Therapy: WFL for tasks assessed/performed Overall Cognitive Status: Within Functional Limits for tasks assessed                                      General Comments       Exercises Exercises: Other exercises Other Exercises Other Exercises: encouraged general BLE ROM while in bed to decrease stiffness;    Shoulder Instructions      Home Living Family/patient expects to be discharged to:: Private residence Living Arrangements: Alone Available Help at Discharge: Family;Available PRN/intermittently(Daughter lives near; pt helps give care for 12 yo grandson) Type of Home: Apartment Home Access: Level entry     Home Layout: One level     Bathroom Shower/Tub: Chief Strategy Officer: Standard Bathroom Accessibility: Yes How Accessible: Accessible via walker(not wc accessible) Home Equipment: Grab bars - tub/shower;Cane - single point;Walker - 4 wheels          Prior Functioning/Environment Level of Independence: Independent;Independent with assistive device(s)        Comments: Rolling walker for amb; drives, cares fo rgrandson        OT Problem List: Decreased strength;Decreased activity tolerance;Impaired balance (sitting and/or standing);Decreased safety awareness;Decreased knowledge of use of DME or AE;Cardiopulmonary status limiting activity;Obesity;Pain      OT Treatment/Interventions: Self-care/ADL training;Energy conservation;DME and/or AE instruction;Therapeutic activities    OT Goals(Current goals can be found in the care plan section) Acute Rehab OT Goals Patient Stated Goal: to be independent agian OT Goal Formulation: With patient Time For Goal Achievement: 08/21/17 Potential to Achieve Goals: Good  OT Frequency: Min 2X/week   Barriers to D/C:            Co-evaluation              AM-PAC PT "6 Clicks" Daily Activity     Outcome Measure Help from another person eating meals?: None Help from another person taking care of personal grooming?: A Little Help from another person toileting, which includes using toliet, bedpan, or urinal?: A Lot Help  from another person bathing (including washing, rinsing, drying)?: A Lot Help from another person to put on and taking off regular upper body clothing?: A Little Help from another person to put on and taking off regular lower body clothing?: A Lot 6 Click Score: 16   End of Session Equipment Utilized During Treatment: Oxygen Nurse Communication: Mobility status  Activity Tolerance: Patient tolerated treatment well Patient left: in bed;with call bell/phone within reach  OT Visit Diagnosis: Other abnormalities of gait and mobility (R26.89);Muscle weakness (generalized) (M62.81);Pain Pain - Right/Left: Right Pain - part of body: Knee                Time: 1610-9604 OT Time Calculation (min): 17 min Charges:  OT General Charges $OT Visit: 1 Visit OT Evaluation $OT Eval Moderate Complexity: 1 Mod G-Codes:     Brithney Bensen, OT/L  [phone] 08/07/2017  Eunice Oldaker,HILLARY 08/07/2017, 2:58 PM

## 2017-08-07 NOTE — Care Management Note (Signed)
Case Management Note  Patient Details  Name: Dana Abbott MRN: [id number] Date of Birth: 1966-04-06  Subjective/Objective:       CHF, anemia              Action/Plan: NCM spoke to pt and plan is for IP rehab once medically stable. IP rehab Coordinator following for CIR. Lives at home with dtr.   Expected Discharge Date:  08/05/17               Expected Discharge Plan:  IP Rehab Facility  In-House Referral:  NA  Discharge planning Services  CM Consult  Post Acute Care Choice:  NA Choice offered to:  NA  DME Arranged:  N/A DME Agency:  NA  HH Arranged:  NA HH Agency:  NA  Status of Service:  Completed, signed off  If discussed at Long Length of Stay Meetings, dates discussed:  08/07/2017  Additional Comments:  Elliot Cousin, RN 08/07/2017, 3:23 PM

## 2017-08-07 NOTE — Progress Notes (Signed)
[facility]                                                                              Patient Demographics  Dana Abbott, is a 52 y.o. female, DOB - 17-Nov-1965, ZOX:[id number]  Admit date - 08/03/2017   Admitting Physician Haydee Salter, MD  Outpatient Primary MD for the patient is [name], MD  Outpatient specialists:   LOS - 3  days   Medical records reviewed and are as summarized below:    Chief Complaint  Patient presents with  . Chest Pain  . Shortness of Breath       Brief summary   52 y.o.femalepast history significant for CHF, CAD presents with shortness of breath. Symptoms started 1 week ago he has bilateral aching squeezing the chest. Worse with exertion, no chest pain. Has been coughing. No melena on DREand Guiac neg perEDP.  She also reported that she has a history of chronic iron deficiency anemia/pernicious anemia and heavy menstrual bleed for many years, from the multiple abdominal surgeries she had including the gastric bypass  Assessment & Plan    Principal problem Acute on chronic symptomatic anemia, iron deficiency and B12 deficiency -Patient has a history of gastric bypass surgery and states she has not been taking iron supplementation and B12 supplementation consistently -Hemoglobin 7.1 at the time of admission, received 1 unit packed RBC transfusion. B12 130 low, iron profile consistent with iron deficiency -Continue IM B12 supplementation until discharged and then place on 1000 mcg daily, IV Feraheme x1 given on 4/14  Active problems    Acute on chronic hypoxic respiratory failure sec to Community acquired pneumonia of left lower lobe of lung (HCC) -Currently stable, continue IV Rocephin, Zithromax.  - Transition to oral antibiotics upon discharge.  Acute on chronic diastolic CHF, underlying chronic respiratory failure -Previous echo in 2017 showed normal EF 55%, complaining of shortness of breath,  exertional -BNP elevated 264.8, patient was placed on IV Lasix, increase to 40 mg IV every 12 hours yesterday.  -Negative balance of 4.5 L today, feels overall improving.   - Echo showed EF 65-70% but severe RV failure, TR, PA pressure 81mm Hg, all new from previous echo, recommending cardiac MRI.  Cardiology consulted. -D-dimer 4.9, rule out PE, follow CT angiogram of the chest.  Also likely has obesity hypoventilation.    Severe osteoarthritis and right knee pain -Per patient she fell on her right side prior to admission, right knee x-ray showed severe osteoarthritis in the right knee, particularly in the medial knee compartment, probable joint effusion -Negative FOBT, restart meloxicam -Orthopedics consulted, status post ESI injection for pain on 4/15.  Unfortunately cannot do anything for severe osteoarthritis/knee replacement until patient has some weight loss. -PT evaluation recommending CIR, consult placed, patient now interested in CIR  Morbid obesity - BMI 77.5, counseled strongly on diet and weight control.  Discussed about diet choices with nutrition, exercise and weight loss.  Code Status: Full CODE STATUS DVT Prophylaxis:  Lovenox  Family Communication: Discussed in detail with the patient, all imaging results, lab results explained to the patient    Disposition Plan: Pending  cardiology evaluation, CT angiogram of the chest  Time Spent in minutes   25 minutes  Procedures:  ESI by orthopedics right knee Consultants:   Orthopedics  Antimicrobials:   IV Zithromax, IV Rocephin 4/12   Medications  Scheduled Meds: . iopamidol      . cyanocobalamin  1,000 mcg Intramuscular Daily  . diclofenac sodium  2 g Topical TID  . ferrous sulfate  325 mg Oral TID WC  . furosemide  40 mg Intravenous Q12H  . guaiFENesin  600 mg Oral BID  . lidocaine  10 mL Other Once  . meloxicam  15 mg Oral Daily  . methylPREDNISolone acetate  80 mg Intra-articular Once  . potassium chloride   20 mEq Oral Daily  . sodium chloride flush  3 mL Intravenous Q12H   Continuous Infusions: . cefTRIAXone (ROCEPHIN)  IV Stopped (08/06/17 2144)   PRN Meds:.acetaminophen **OR** acetaminophen, albuterol, HYDROcodone-acetaminophen, ondansetron **OR** ondansetron (ZOFRAN) IV   Antibiotics   Anti-infectives (From admission, onward)   Start     Dose/Rate Route Frequency Ordered Stop   08/06/17 0000  azithromycin (ZITHROMAX) 500 MG tablet     500 mg Oral Daily 08/06/17 0756 08/11/17 2359   08/06/17 0000  cefUROXime (CEFTIN) 500 MG tablet     500 mg Oral 2 times daily with meals 08/06/17 0756 08/11/17 2359   08/04/17 2200  azithromycin (ZITHROMAX) 500 mg in sodium chloride 0.9 % 250 mL IVPB     500 mg 250 mL/hr over 60 Minutes Intravenous Every 24 hours 08/04/17 0244 08/05/17 2307   08/04/17 2200  cefTRIAXone (ROCEPHIN) 1 g in sodium chloride 0.9 % 100 mL IVPB     1 g 200 mL/hr over 30 Minutes Intravenous Every 24 hours 08/04/17 0244     08/03/17 2100  cefTRIAXone (ROCEPHIN) 1 g in sodium chloride 0.9 % 100 mL IVPB     1 g 200 mL/hr over 30 Minutes Intravenous  Once 08/03/17 2058 08/03/17 2248   08/03/17 2100  azithromycin (ZITHROMAX) 500 mg in sodium chloride 0.9 % 250 mL IVPB     500 mg 250 mL/hr over 60 Minutes Intravenous  Once 08/03/17 2058 08/04/17 0135        Subjective:   Dana Abbott was seen and examined today.  Feeling somewhat better today, no fevers or chills, shortness of breath improving.  Right knee improving after injection yesterday.  Patient now agreeable for CIR. No chest pain,  nausea, vomiting, abdominal pain.  Objective:   Vitals:   08/06/17 0021 08/06/17 0804 08/07/17 0040 08/07/17 0805  BP: 99/67 (!) 92/51 107/64 (!) 106/50  Pulse: 88 80 85 81  Resp:  19  (!) 22  Temp: 98 F (36.7 C)  98.7 F (37.1 C) 98.1 F (36.7 C)  TempSrc: Oral  Oral Oral  SpO2: 92% 91% 99% 93%  Weight:      Height:        Intake/Output Summary (Last 24 hours) at  08/07/2017 1058 Last data filed at 08/07/2017 0715 Gross per 24 hour  Intake 343 ml  Output 4200 ml  Net -3857 ml     Wt Readings from Last 3 Encounters:  08/04/17 (!) 162.7 kg (358 lb 9.6 oz)  11/02/15 (!) 167.4 kg (369 lb)  10/06/15 (!) 168.8 kg (372 lb 1.6 oz)     Exam   General: Alert and oriented x 3, NAD  Eyes:   HEENT:    Cardiovascular: S1 S2 auscultated, Regular rate and rhythm. No pedal  edema b/l  Respiratory: Decreased breath sound at the bases  Gastrointestinal: Morbidly obese soft, nontender, nondistended, + bowel sounds  Ext: no pedal edema bilaterally  Neuro: no new deficit  Musculoskeletal: No digital cyanosis, clubbing  Skin: No rashes  Psych: Normal affect and demeanor, alert and oriented x3    Data Reviewed:  I have personally reviewed following labs and imaging studies  Micro Results Recent Results (from the past 240 hour(s))  Blood culture (routine x 2)     Status: None (Preliminary result)   Collection Time: 08/03/17  8:35 PM  Result Value Ref Range Status   Specimen Description BLOOD RIGHT HAND  Final   Special Requests   Final    BOTTLES DRAWN AEROBIC AND ANAEROBIC Blood Culture adequate volume   Culture   Final    NO GROWTH 3 DAYS Performed at [facility], [address], [place], [place] [address]    Report Status PENDING  Incomplete  Blood culture (routine x 2)     Status: None (Preliminary result)   Collection Time: 08/03/17  8:50 PM  Result Value Ref Range Status   Specimen Description BLOOD LEFT WRIST  Final   Special Requests   Final    BOTTLES DRAWN AEROBIC AND ANAEROBIC Blood Culture adequate volume   Culture   Final    NO GROWTH 3 DAYS Performed at [facility], [address] [place], [place] [address]    Report Status PENDING  Incomplete    Radiology Reports Dg Chest 2 View  Result Date: 08/03/2017 CLINICAL DATA:  Chest pain. EXAM: CHEST - 2 VIEW COMPARISON:  Chest x-ray and CT chest dated  June 02, 2016. FINDINGS: Stable cardiomegaly. Worsening perihilar interstitial opacities. New patchy opacities in the left lower lobe. No pleural effusion or pneumothorax. No acute osseous abnormality. IMPRESSION: 1. Stable cardiomegaly with worsening perihilar edema. 2. New patchy opacities in the left lower lobe, atelectasis versus infiltrate. Electronically Signed   By: Obie Dredge M.D.   On: 08/03/2017 19:28   Dg Knee 3 View Right  Result Date: 08/05/2017 CLINICAL DATA:  Recent fall with right knee pain. Pain is along the anterior surface. EXAM: RIGHT KNEE - 3 VIEW COMPARISON:  02/09/2016 FINDINGS: Again noted are severe degenerative changes involving the right knee. Severe medial compartment narrowing with flattening and remodeling of the medial femoral condyle. Mild lateral subluxation of the right knee associated with the degenerative disease. Diffuse osteophytosis in the knee. Negative for an acute fracture or dislocation. Probable suprapatellar joint effusion. Bone detail is limited due to body habitus. IMPRESSION: Severe osteoarthritis in the right knee, particularly in the medial knee compartment. Probable joint effusion. No acute bone abnormality. Electronically Signed   By: Richarda Overlie M.D.   On: 08/05/2017 10:05    Lab Data:  CBC: Recent Labs  Lab 08/03/17 1949 08/04/17 0238 08/04/17 1119 08/05/17 0145 08/06/17 0238 08/07/17 0211  WBC 4.1 4.9  --  5.4 5.5 7.3  NEUTROABS  --   --   --  3.3  --   --   HGB 7.3* 7.1* 7.8* 8.3* 8.1* 8.2*  HCT 27.3* 27.0* 29.2* 29.8* 30.3* 32.0*  MCV 82.0 82.3  --  82.3 85.8 86.3  PLT 328 355  --  493* 512* 591*   Basic Metabolic Panel: Recent Labs  Lab 08/03/17 1949 08/04/17 0238 08/06/17 0238 08/07/17 0211  NA 140 140 139 141  K 4.2 3.8 4.3 4.1  CL 104 103 101 99*  CO2 28 27 30  34*  GLUCOSE 98 101* 96 102*  BUN 8 5* 6 5*  CREATININE 0.50 0.41* 0.47 0.45  CALCIUM 8.1* 8.1* 8.4* 8.4*   GFR: Estimated Creatinine Clearance: 118.4  mL/min (by C-G formula based on SCr of 0.45 mg/dL). Liver Function Tests: Recent Labs  Lab 08/03/17 1949  AST 19  ALT 12*  ALKPHOS 80  BILITOT 0.5  PROT 6.7  ALBUMIN 3.0*   No results for input(s): LIPASE, AMYLASE in the last 168 hours. No results for input(s): AMMONIA in the last 168 hours. Coagulation Profile: No results for input(s): INR, PROTIME in the last 168 hours. Cardiac Enzymes: Recent Labs  Lab 08/03/17 1949  TROPONINI <0.03   BNP (last 3 results) No results for input(s): PROBNP in the last 8760 hours. HbA1C: No results for input(s): HGBA1C in the last 72 hours. CBG: No results for input(s): GLUCAP in the last 168 hours. Lipid Profile: No results for input(s): CHOL, HDL, LDLCALC, TRIG, CHOLHDL, LDLDIRECT in the last 72 hours. Thyroid Function Tests: No results for input(s): TSH, T4TOTAL, FREET4, T3FREE, THYROIDAB in the last 72 hours. Anemia Panel: Recent Labs    08/05/17 0656  VITAMINB12 130*  FOLATE 10.6  FERRITIN 5*  TIBC 410  IRON 18*  RETICCTPCT 2.2   Urine analysis:    Component Value Date/Time   COLORURINE YELLOW 07/31/2015 0154   APPEARANCEUR CLEAR 07/31/2015 0154   LABSPEC 1.007 07/31/2015 0154   PHURINE 6.0 07/31/2015 0154   GLUCOSEU NEGATIVE 07/31/2015 0154   HGBUR MODERATE (A) 07/31/2015 0154   BILIRUBINUR NEGATIVE 07/31/2015 0154   KETONESUR NEGATIVE 07/31/2015 0154   PROTEINUR NEGATIVE 07/31/2015 0154   UROBILINOGEN 4.0 (H) 03/09/2010 1245   NITRITE NEGATIVE 07/31/2015 0154   LEUKOCYTESUR TRACE (A) 07/31/2015 0154     Konni Kesinger M.D. [facility] 08/07/2017, 10:58 AM  Pager: [phone] Between 7am to 7pm - call Pager - [phone]  After 7pm go to [email or web address] - password [email or web address]  Call night coverage person covering after 7pm

## 2017-08-08 ENCOUNTER — Inpatient Hospital Stay (HOSPITAL_COMMUNITY)
Admission: EM | Disposition: A | Discharge status: Rehabilitation Facility | Payer: Self-pay | Source: Home / Self Care | Attending: Internal Medicine

## 2017-08-08 HISTORY — PX: RIGHT/LEFT HEART CATH AND CORONARY ANGIOGRAPHY: CATH118266

## 2017-08-08 LAB — POCT I-STAT 3, VENOUS BLOOD GAS (G3P V)
Acid-Base Excess: 11 mmol/L — ABNORMAL HIGH (ref 0.0–2.0)
Acid-Base Excess: 7 mmol/L — ABNORMAL HIGH (ref 0.0–2.0)
BICARBONATE: 32.5 mmol/L — AB (ref 20.0–28.0)
BICARBONATE: 37.4 mmol/L — AB (ref 20.0–28.0)
O2 SAT: 55 %
O2 SAT: 60 %
PCO2 VEN: 52.7 mmHg (ref 44.0–60.0)
PCO2 VEN: 60.9 mmHg — AB (ref 44.0–60.0)
PO2 VEN: 32 mmHg (ref 32.0–45.0)
TCO2: 39 mmol/L — AB (ref 22–32)
TCO2: 34 mmol/L — ABNORMAL HIGH (ref 22–32)
pH, Ven: 7.396 (ref 7.250–7.430)
pH, Ven: 7.398 (ref 7.250–7.430)
pO2, Ven: 30 mmHg — CL (ref 32.0–45.0)

## 2017-08-08 LAB — POCT I-STAT 3, ART BLOOD GAS (G3+)
Acid-Base Excess: 9 mmol/L — ABNORMAL HIGH (ref 0.0–2.0)
BICARBONATE: 34.9 mmol/L — AB (ref 20.0–28.0)
O2 Saturation: 93 %
PH ART: 7.426 (ref 7.350–7.450)
TCO2: 36 mmol/L — AB (ref 22–32)
pCO2 arterial: 53 mmHg — ABNORMAL HIGH (ref 32.0–48.0)
pO2, Arterial: 67 mmHg — ABNORMAL LOW (ref 83.0–108.0)

## 2017-08-08 LAB — CULTURE, BLOOD (ROUTINE X 2)
CULTURE: NO GROWTH
Culture: NO GROWTH
SPECIAL REQUESTS: ADEQUATE
Special Requests: ADEQUATE

## 2017-08-08 LAB — CBC
HCT: 32.4 % — ABNORMAL LOW (ref 36.0–46.0)
Hemoglobin: 8.3 g/dL — ABNORMAL LOW (ref 12.0–15.0)
MCH: 22.5 pg — AB (ref 26.0–34.0)
MCHC: 25.6 g/dL — ABNORMAL LOW (ref 30.0–36.0)
MCV: 87.8 fL (ref 78.0–100.0)
PLATELETS: 615 10*3/uL — AB (ref 150–400)
RBC: 3.69 MIL/uL — ABNORMAL LOW (ref 3.87–5.11)
RDW: 25.1 % — AB (ref 11.5–15.5)
WBC: 7 10*3/uL (ref 4.0–10.5)

## 2017-08-08 LAB — BASIC METABOLIC PANEL
Anion gap: 8 (ref 5–15)
BUN: 6 mg/dL (ref 6–20)
CO2: 37 mmol/L — ABNORMAL HIGH (ref 22–32)
Calcium: 8.6 mg/dL — ABNORMAL LOW (ref 8.9–10.3)
Chloride: 97 mmol/L — ABNORMAL LOW (ref 101–111)
Creatinine, Ser: 0.49 mg/dL (ref 0.44–1.00)
GFR calc Af Amer: >60 mL/min (ref >60)
GFR calc non Af Amer: >60 mL/min (ref >60)
GLUCOSE: 102 mg/dL — AB (ref 65–99)
Potassium: 4.2 mmol/L (ref 3.5–5.1)
Sodium: 142 mmol/L (ref 135–145)

## 2017-08-08 LAB — PROTIME-INR
INR: 1.11
Prothrombin Time: 14.2 seconds (ref 11.4–15.2)

## 2017-08-08 SURGERY — RIGHT/LEFT HEART CATH AND CORONARY ANGIOGRAPHY
Anesthesia: LOCAL

## 2017-08-08 MED ORDER — SODIUM CHLORIDE 0.9% FLUSH
3.0000 mL | Freq: Two times a day (BID) | INTRAVENOUS | Status: DC
  Administered 2017-08-09: 12:00:00 3 mL via INTRAVENOUS

## 2017-08-08 MED ORDER — IOPAMIDOL (ISOVUE-370) INJECTION 76%
INTRAVENOUS | Status: DC | PRN
  Administered 2017-08-08: 55 mL via INTRA_ARTERIAL

## 2017-08-08 MED ORDER — VERAPAMIL HCL 2.5 MG/ML IV SOLN
INTRAVENOUS | Status: AC
  Filled 2017-08-08: qty 2

## 2017-08-08 MED ORDER — LIDOCAINE HCL (PF) 1 % IJ SOLN
INTRAMUSCULAR | Status: AC
  Filled 2017-08-08: qty 30

## 2017-08-08 MED ORDER — HEPARIN (PORCINE) IN NACL 1000-0.9 UT/500ML-% IV SOLN
INTRAVENOUS | Status: AC
  Filled 2017-08-08: qty 1000

## 2017-08-08 MED ORDER — FENTANYL CITRATE (PF) 100 MCG/2ML IJ SOLN
INTRAMUSCULAR | Status: AC
  Filled 2017-08-08: qty 2

## 2017-08-08 MED ORDER — ACETAMINOPHEN 325 MG PO TABS: 650.0000 mg | ORAL_TABLET | ORAL | Status: DC | PRN

## 2017-08-08 MED ORDER — LIDOCAINE HCL (PF) 1 % IJ SOLN
INTRAMUSCULAR | Status: DC | PRN
  Administered 2017-08-08 (×2): 2 mL

## 2017-08-08 MED ORDER — FENTANYL CITRATE (PF) 100 MCG/2ML IJ SOLN
INTRAMUSCULAR | Status: DC | PRN
  Administered 2017-08-08: 25 ug via INTRAVENOUS

## 2017-08-08 MED ORDER — HEPARIN (PORCINE) IN NACL 2-0.9 UNITS/ML
INTRAMUSCULAR | Status: AC | PRN
  Administered 2017-08-08 (×2): 500 mL via INTRA_ARTERIAL

## 2017-08-08 MED ORDER — HEPARIN (PORCINE) IN NACL 2-0.9 UNIT/ML-% IJ SOLN
INTRAMUSCULAR | Status: DC | PRN
  Administered 2017-08-08: 17:00:00 via INTRA_ARTERIAL

## 2017-08-08 MED ORDER — ONDANSETRON HCL 4 MG/2ML IJ SOLN: 4.0000 mg | Freq: Four times a day (QID) | INTRAMUSCULAR | Status: DC | PRN

## 2017-08-08 MED ORDER — ENOXAPARIN SODIUM 40 MG/0.4ML ~~LOC~~ SOLN
40.0000 mg | SUBCUTANEOUS | Status: DC
  Administered 2017-08-09: 40 mg via SUBCUTANEOUS
  Filled 2017-08-08: qty 0.4

## 2017-08-08 MED ORDER — MIDAZOLAM HCL 2 MG/2ML IJ SOLN
INTRAMUSCULAR | Status: DC | PRN
  Administered 2017-08-08 (×2): 1 mg via INTRAVENOUS

## 2017-08-08 MED ORDER — HEPARIN SODIUM (PORCINE) 1000 UNIT/ML IJ SOLN
INTRAMUSCULAR | Status: AC
  Filled 2017-08-08: qty 1

## 2017-08-08 MED ORDER — SODIUM CHLORIDE 0.9 % IV SOLN
INTRAVENOUS | Status: AC
  Administered 2017-08-08: 19:00:00 via INTRAVENOUS

## 2017-08-08 MED ORDER — SODIUM CHLORIDE 0.9 % IV SOLN: 250.0000 mL | INTRAVENOUS | Status: DC | PRN

## 2017-08-08 MED ORDER — MIDAZOLAM HCL 2 MG/2ML IJ SOLN
INTRAMUSCULAR | Status: AC
  Filled 2017-08-08: qty 2

## 2017-08-08 MED ORDER — HEPARIN SODIUM (PORCINE) 1000 UNIT/ML IJ SOLN
INTRAMUSCULAR | Status: DC | PRN
  Administered 2017-08-08: 5000 [IU] via INTRAVENOUS

## 2017-08-08 MED ORDER — IOPAMIDOL (ISOVUE-370) INJECTION 76%
INTRAVENOUS | Status: AC
  Filled 2017-08-08: qty 100

## 2017-08-08 MED ORDER — SODIUM CHLORIDE 0.9% FLUSH: 3.0000 mL | INTRAVENOUS | Status: DC | PRN

## 2017-08-08 SURGICAL SUPPLY — 18 items
CATH BALLN WEDGE 5F 110CM (CATHETERS) ×2 IMPLANT
CATH INFINITI 5 FR JL3.5 (CATHETERS) ×2 IMPLANT
CATH INFINITI JR4 5F (CATHETERS) ×2 IMPLANT
COVER PRB 48X5XTLSCP FOLD TPE (BAG) ×1 IMPLANT
COVER PROBE 5X48 (BAG) ×1
DEVICE RAD COMP TR BAND LRG (VASCULAR PRODUCTS) ×2 IMPLANT
GUIDEWIRE .025 260CM (WIRE) ×2 IMPLANT
GUIDEWIRE INQWIRE 1.5J.035X260 (WIRE) ×1 IMPLANT
HOVERMATT SINGLE USE (MISCELLANEOUS) ×2 IMPLANT
INQWIRE 1.5J .035X260CM (WIRE) ×2
KIT HEART LEFT (KITS) ×2 IMPLANT
KIT MICROPUNCTURE NIT STIFF (SHEATH) ×2 IMPLANT
NEEDLE PERC 21GX4CM (NEEDLE) ×2 IMPLANT
PACK CARDIAC CATHETERIZATION (CUSTOM PROCEDURE TRAY) ×2 IMPLANT
SHEATH RAIN 4/5FR (SHEATH) ×2 IMPLANT
SHEATH RAIN RADIAL 21G 6FR (SHEATH) ×2 IMPLANT
TRANSDUCER W/STOPCOCK (MISCELLANEOUS) ×2 IMPLANT
TUBING CIL FLEX 10 FLL-RA (TUBING) ×2 IMPLANT

## 2017-08-08 NOTE — H&P (View-Only) (Signed)
Advanced Heart Failure Rounding Note  PCP-Cardiologist: No primary care provider on file.   Subjective:     Yesterday diuresed with IV lasix. Negative 2.3 liters.   Denies SOB/CP . No orthopnea or PND. No dizziness   Objective:   Weight Range: (!) 358 lb 9.6 oz (162.7 kg) Body mass index is 77.6 kg/m.   Vital Signs:   Temp:  [98.2 F (36.8 C)-98.6 F (37 C)] 98.2 F (36.8 C) (04/17 0032) Pulse Rate:  [80-94] 80 (04/17 0732) Resp:  [17-20] 19 (04/17 0732) BP: (99-110)/(69-80) 110/80 (04/17 0732) SpO2:  [94 %-96 %] 95 % (04/17 0732) Last BM Date: 08/06/17  Weight change: Filed Weights   08/04/17 0020 08/04/17 2243  Weight: (!) 357 lb 2.3 oz (162 kg) (!) 358 lb 9.6 oz (162.7 kg)    Intake/Output:   Intake/Output Summary (Last 24 hours) at 08/08/2017 1155 Last data filed at 08/08/2017 0836 Gross per 24 hour  Intake 120 ml  Output 2200 ml  Net -2080 ml      Physical Exam    General:  In bed.  No resp difficulty HEENT: Normal anicteric  Neck: Supple. JVP ~10  . Carotids 2+ bilat; no bruits. No lymphadenopathy or thyromegaly appreciated. Cor: PMI nondisplaced. Regular rate & rhythm.2/6 TR RV lift Lungs: Clear on 2 liters oxygen.  Abdomen: obese,  nontender, nondistended. No hepatosplenomegaly. No bruits or masses. Good bowel sounds. Extremities: No cyanosis, clubbing, rash, edema Neuro: alert & oriented x 3, cranial nerves grossly intact. moves all 4 extremities w/o difficulty. Affect pleasant    Telemetry   NSR 70s Personally reviewed   EKG    N/A   Labs    CBC Recent Labs    08/07/17 0211 08/08/17 0239  WBC 7.3 7.0  HGB 8.2* 8.3*  HCT 32.0* 32.4*  MCV 86.3 87.8  PLT 591* 615*   Basic Metabolic Panel Recent Labs    91/69/45 0211 08/08/17 0239  NA 141 142  K 4.1 4.2  CL 99* 97*  CO2 34* 37*  GLUCOSE 102* 102*  BUN 5* 6  CREATININE 0.45 0.49  CALCIUM 8.4* 8.6*   Liver Function Tests No results for input(s): AST, ALT, ALKPHOS,  BILITOT, PROT, ALBUMIN in the last 72 hours. No results for input(s): LIPASE, AMYLASE in the last 72 hours. Cardiac Enzymes No results for input(s): CKTOTAL, CKMB, CKMBINDEX, TROPONINI in the last 72 hours.  BNP: BNP (last 3 results) Recent Labs    08/03/17 1949  BNP 264.8*    ProBNP (last 3 results) No results for input(s): PROBNP in the last 8760 hours.   D-Dimer Recent Labs    08/06/17 1629  DDIMER 4.99*   Hemoglobin A1C No results for input(s): HGBA1C in the last 72 hours. Fasting Lipid Panel No results for input(s): CHOL, HDL, LDLCALC, TRIG, CHOLHDL, LDLDIRECT in the last 72 hours. Thyroid Function Tests No results for input(s): TSH, T4TOTAL, T3FREE, THYROIDAB in the last 72 hours.  Invalid input(s): FREET3  Other results:   Imaging     No results found.   Medications:     Scheduled Medications: . cyanocobalamin  1,000 mcg Intramuscular Daily  . diclofenac sodium  2 g Topical TID  . ferrous sulfate  325 mg Oral TID WC  . furosemide  40 mg Intravenous Q12H  . guaiFENesin  600 mg Oral BID  . lidocaine  10 mL Other Once  . meloxicam  15 mg Oral Daily  . methylPREDNISolone acetate  80 mg  Intra-articular Once  . potassium chloride  20 mEq Oral Daily  . sodium chloride flush  3 mL Intravenous Q12H  . sodium chloride flush  3 mL Intravenous Q12H     Infusions: . sodium chloride    . sodium chloride 10 mL/hr at 08/07/17 2035  . cefTRIAXone (ROCEPHIN)  IV Stopped (08/07/17 2249)     PRN Medications:  sodium chloride, acetaminophen **OR** acetaminophen, albuterol, HYDROcodone-acetaminophen, ondansetron **OR** ondansetron (ZOFRAN) IV, sodium chloride flush    Patient Profile   Ms Hamblin is a 52 year old with history of anemia, DVT, gastric bypass surgery 2004 - lost 200 pounds but has gained 50 back, MI years ago >10 years ago (no intervention), morbid obesity, and chronic diastolic heart failure. Former smoker. Uses oxygen as needed at  night.  Admitted with chest pain and increased shortness of breath.     Assessment/Plan   . Chest pain Troponin < 0.03.  Had MI years ago.   2. Symptomatic Anemia - iron and b12 deficiency On iron and b12. Hgb on admit 7.1, today 8.3. FOBT negative.  Received Feraheme this admit.   3. A/C Diastolic Heart Failure--> Severe RV Failure ECHO with elevated filling pressures. EF 60%.  Volume status improving. Continue to diurese with IV lasix. Adjust diuretics post cath.   4. Acute/Chronic Hypoxic Respiratory Failure secondary to CAP.  On rocephin and zithromycin.  5. Pulmonary Hypertension PA pressures high on ECHO. 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 CTA- negative for PE Will need RHC to further evaluate.   6 Chronic Hypoxic Respiratory Failure Sats < 88% with ambulation.  Will need to use oxygen with ambulation.  Needs formal sleep study.   7. Mobid Obesity Body mass index is 77.6 kg/m.  Had gastric bypass surgery in 2004. Initially lost 200 pounds but has gained back 50 pounds.   8. Deconditioning PT recommending CIR.   Plan for RHC/LHC today.   Medication concerns reviewed with patient and pharmacy team. Barriers identified: none  Length of Stay: 4  Amy Clegg, NP  08/08/2017, 11:55 AM  Advanced Heart Failure Team Pager [phone] (M-F; 7a - 4p)  Please contact [facility] for night-coverage after hours (4p -7a ) and weekends on [email or web address]  Patient seen and examined with Tonye Becket, NP. We discussed all aspects of the encounter. I agree with the assessment and plan as stated above.   Volume status improved with lasix. Echo shows PAH and RV strain. Will proceed with R/L cath today.   Arvilla Meres, MD  4:25 PM

## 2017-08-08 NOTE — Interval H&P Note (Signed)
History and Physical Interval Note:  08/08/2017 4:26 PM  Dana Abbott  has presented today for surgery, with the diagnosis of hf  The various methods of treatment have been discussed with the patient and family. After consideration of risks, benefits and other options for treatment, the patient has consented to  Procedure(s): RIGHT/LEFT HEART CATH AND CORONARY ANGIOGRAPHY (N/A) and possible coronary angioplasty as a surgical intervention .  The patient's history has been reviewed, patient examined, no change in status, stable for surgery.  I have reviewed the patient's chart and labs.  Questions were answered to the patient's satisfaction.     [name]

## 2017-08-08 NOTE — Progress Notes (Signed)
Inpatient Rehabilitation  Note plans for a heart cath today.  Plan to follow up tomorrow for timing of medical readiness.    Charlane Ferretti., CCC/SLP Admission Coordinator  [facility]  Cell [phone]

## 2017-08-08 NOTE — Progress Notes (Addendum)
Advanced Heart Failure Rounding Note  PCP-Cardiologist: No primary care provider on file.   Subjective:     Yesterday diuresed with IV lasix. Negative 2.3 liters.   Denies SOB/CP . No orthopnea or PND. No dizziness   Objective:   Weight Range: (!) 358 lb 9.6 oz (162.7 kg) Body mass index is 77.6 kg/m.   Vital Signs:   Temp:  [98.2 F (36.8 C)-98.6 F (37 C)] 98.2 F (36.8 C) (04/17 0032) Pulse Rate:  [80-94] 80 (04/17 0732) Resp:  [17-20] 19 (04/17 0732) BP: (99-110)/(69-80) 110/80 (04/17 0732) SpO2:  [94 %-96 %] 95 % (04/17 0732) Last BM Date: 08/06/17  Weight change: Filed Weights   08/04/17 0020 08/04/17 2243  Weight: (!) 357 lb 2.3 oz (162 kg) (!) 358 lb 9.6 oz (162.7 kg)    Intake/Output:   Intake/Output Summary (Last 24 hours) at 08/08/2017 1155 Last data filed at 08/08/2017 0836 Gross per 24 hour  Intake 120 ml  Output 2200 ml  Net -2080 ml      Physical Exam    General:  In bed.  No resp difficulty HEENT: Normal anicteric  Neck: Supple. JVP ~10  . Carotids 2+ bilat; no bruits. No lymphadenopathy or thyromegaly appreciated. Cor: PMI nondisplaced. Regular rate & rhythm.2/6 TR RV lift Lungs: Clear on 2 liters oxygen.  Abdomen: obese,  nontender, nondistended. No hepatosplenomegaly. No bruits or masses. Good bowel sounds. Extremities: No cyanosis, clubbing, rash, edema Neuro: alert & oriented x 3, cranial nerves grossly intact. moves all 4 extremities w/o difficulty. Affect pleasant    Telemetry   NSR 70s Personally reviewed   EKG    N/A   Labs    CBC Recent Labs    08/07/17 0211 08/08/17 0239  WBC 7.3 7.0  HGB 8.2* 8.3*  HCT 32.0* 32.4*  MCV 86.3 87.8  PLT 591* 615*   Basic Metabolic Panel Recent Labs    91/69/45 0211 08/08/17 0239  NA 141 142  K 4.1 4.2  CL 99* 97*  CO2 34* 37*  GLUCOSE 102* 102*  BUN 5* 6  CREATININE 0.45 0.49  CALCIUM 8.4* 8.6*   Liver Function Tests No results for input(s): AST, ALT, ALKPHOS,  BILITOT, PROT, ALBUMIN in the last 72 hours. No results for input(s): LIPASE, AMYLASE in the last 72 hours. Cardiac Enzymes No results for input(s): CKTOTAL, CKMB, CKMBINDEX, TROPONINI in the last 72 hours.  BNP: BNP (last 3 results) Recent Labs    08/03/17 1949  BNP 264.8*    ProBNP (last 3 results) No results for input(s): PROBNP in the last 8760 hours.   D-Dimer Recent Labs    08/06/17 1629  DDIMER 4.99*   Hemoglobin A1C No results for input(s): HGBA1C in the last 72 hours. Fasting Lipid Panel No results for input(s): CHOL, HDL, LDLCALC, TRIG, CHOLHDL, LDLDIRECT in the last 72 hours. Thyroid Function Tests No results for input(s): TSH, T4TOTAL, T3FREE, THYROIDAB in the last 72 hours.  Invalid input(s): FREET3  Other results:   Imaging     No results found.   Medications:     Scheduled Medications: . cyanocobalamin  1,000 mcg Intramuscular Daily  . diclofenac sodium  2 g Topical TID  . ferrous sulfate  325 mg Oral TID WC  . furosemide  40 mg Intravenous Q12H  . guaiFENesin  600 mg Oral BID  . lidocaine  10 mL Other Once  . meloxicam  15 mg Oral Daily  . methylPREDNISolone acetate  80 mg  Intra-articular Once  . potassium chloride  20 mEq Oral Daily  . sodium chloride flush  3 mL Intravenous Q12H  . sodium chloride flush  3 mL Intravenous Q12H     Infusions: . sodium chloride    . sodium chloride 10 mL/hr at 08/07/17 2035  . cefTRIAXone (ROCEPHIN)  IV Stopped (08/07/17 2249)     PRN Medications:  sodium chloride, acetaminophen **OR** acetaminophen, albuterol, HYDROcodone-acetaminophen, ondansetron **OR** ondansetron (ZOFRAN) IV, sodium chloride flush    Patient Profile   Ms Hamblin is a 52 year old with history of anemia, DVT, gastric bypass surgery 2004 - lost 200 pounds but has gained 50 back, MI years ago >10 years ago (no intervention), morbid obesity, and chronic diastolic heart failure. Former smoker. Uses oxygen as needed at  night.  Admitted with chest pain and increased shortness of breath.     Assessment/Plan   . Chest pain Troponin < 0.03.  Had MI years ago.   2. Symptomatic Anemia - iron and b12 deficiency On iron and b12. Hgb on admit 7.1, today 8.3. FOBT negative.  Received Feraheme this admit.   3. A/C Diastolic Heart Failure--> Severe RV Failure ECHO with elevated filling pressures. EF 60%.  Volume status improving. Continue to diurese with IV lasix. Adjust diuretics post cath.   4. Acute/Chronic Hypoxic Respiratory Failure secondary to CAP.  On rocephin and zithromycin.  5. Pulmonary Hypertension PA pressures high on ECHO. 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 CTA- negative for PE Will need RHC to further evaluate.   6 Chronic Hypoxic Respiratory Failure Sats < 88% with ambulation.  Will need to use oxygen with ambulation.  Needs formal sleep study.   7. Mobid Obesity Body mass index is 77.6 kg/m.  Had gastric bypass surgery in 2004. Initially lost 200 pounds but has gained back 50 pounds.   8. Deconditioning PT recommending CIR.   Plan for RHC/LHC today.   Medication concerns reviewed with patient and pharmacy team. Barriers identified: none  Length of Stay: 4  Amy Clegg, NP  08/08/2017, 11:55 AM  Advanced Heart Failure Team Pager [phone] (M-F; 7a - 4p)  Please contact [facility] for night-coverage after hours (4p -7a ) and weekends on [email or web address]  Patient seen and examined with Tonye Becket, NP. We discussed all aspects of the encounter. I agree with the assessment and plan as stated above.   Volume status improved with lasix. Echo shows PAH and RV strain. Will proceed with R/L cath today.   Arvilla Meres, MD  4:25 PM

## 2017-08-08 NOTE — Progress Notes (Signed)
[facility]                                                                              Patient Demographics  Dana Abbott, is a 52 y.o. female, DOB - 03-Jun-1965, OZH:[id number]  Admit date - 08/03/2017   Admitting Physician Haydee Salter, MD  Outpatient Primary MD for the patient is [name], MD  Outpatient specialists:   LOS - 4  days   Medical records reviewed and are as summarized below:    Chief Complaint  Patient presents with  . Chest Pain  . Shortness of Breath       Brief summary   52 y.o.femalepast history significant for CHF, CAD presents with shortness of breath. Symptoms started 1 week ago he has bilateral aching squeezing the chest. Worse with exertion, no chest pain. Has been coughing. No melena on DREand Guiac neg perEDP.  She also reported that she has a history of chronic iron deficiency anemia/pernicious anemia and heavy menstrual bleed for many years, from the multiple abdominal surgeries she had including the gastric bypass  Assessment & Plan    Principal problem Acute on chronic symptomatic anemia, iron deficiency and B12 deficiency -Patient has a history of gastric bypass surgery and states she has not been taking iron supplementation and B12 supplementation consistently -Hemoglobin 7.1 at the time of admission, received 1 unit packed RBC transfusion. B12 130 low, iron profile consistent with iron deficiency -Continue IM B12 supplementation until discharged and then place on 1000 mcg daily, IV Feraheme x1 given on 4/14  Active problems    Acute on chronic hypoxic respiratory failure sec to Community acquired pneumonia of left lower lobe of lung (HCC), acute on chronic diastolic CHF, severe pulmonary hypertension, obesity hypoventilation -Currently stable, continue IV Rocephin, Zithromax.  - Transition to oral antibiotics upon discharge.  Acute on chronic diastolic CHF, underlying chronic respiratory  failure -Previous echo in 2017 showed normal EF 55%, complaining of shortness of breath, exertional -BNP elevated 264.8, patient was placed on IV Lasix, increase to 40 mg IV every 12 hours yesterday.  -Negative balance of 4.5 L today, feels overall improving.   - Echo showed EF 65-70% but severe RV failure, TR, PA pressure 81mm Hg -CT angiogram negative for pulmonary embolism, plan for cardiac cath today.  Patient had questions about cardiac cath, answered all her questions to the best of my ability.  Severe osteoarthritis and right knee pain -Per patient she fell on her right side prior to admission, right knee x-ray showed severe osteoarthritis in the right knee, particularly in the medial knee compartment, probable joint effusion -Negative FOBT, restarted meloxicam -Orthopedics consulted, status post ESI injection for pain on 4/15.  Unfortunately cannot do anything for severe osteoarthritis/knee replacement until patient has some weight loss. -PT evaluation recommending CIR, interested in CIR  Morbid obesity - BMI 77.5, counseled strongly on diet and weight control.  Discussed about diet choices with nutrition, exercise and weight loss.  Code Status: Full CODE STATUS DVT Prophylaxis:  Lovenox  Family Communication: Discussed in detail with the patient, all imaging results, lab results explained to the patient  Disposition Plan: cardiac cath today   Time Spent in minutes   25 minutes  Procedures:  ESI by orthopedics right knee Consultants:   Orthopedics  Antimicrobials:   IV Zithromax, IV Rocephin 4/12   Medications  Scheduled Meds: . cyanocobalamin  1,000 mcg Intramuscular Daily  . diclofenac sodium  2 g Topical TID  . ferrous sulfate  325 mg Oral TID WC  . furosemide  40 mg Intravenous Q12H  . guaiFENesin  600 mg Oral BID  . lidocaine  10 mL Other Once  . meloxicam  15 mg Oral Daily  . methylPREDNISolone acetate  80 mg Intra-articular Once  . potassium chloride  20  mEq Oral Daily  . sodium chloride flush  3 mL Intravenous Q12H  . sodium chloride flush  3 mL Intravenous Q12H   Continuous Infusions: . sodium chloride    . sodium chloride 10 mL/hr at 08/07/17 2035  . cefTRIAXone (ROCEPHIN)  IV Stopped (08/07/17 2249)   PRN Meds:.sodium chloride, acetaminophen **OR** acetaminophen, albuterol, HYDROcodone-acetaminophen, ondansetron **OR** ondansetron (ZOFRAN) IV, sodium chloride flush   Antibiotics   Anti-infectives (From admission, onward)   Start     Dose/Rate Route Frequency Ordered Stop   08/06/17 0000  azithromycin (ZITHROMAX) 500 MG tablet     500 mg Oral Daily 08/06/17 0756 08/11/17 2359   08/06/17 0000  cefUROXime (CEFTIN) 500 MG tablet     500 mg Oral 2 times daily with meals 08/06/17 0756 08/11/17 2359   08/04/17 2200  azithromycin (ZITHROMAX) 500 mg in sodium chloride 0.9 % 250 mL IVPB     500 mg 250 mL/hr over 60 Minutes Intravenous Every 24 hours 08/04/17 0244 08/05/17 2307   08/04/17 2200  cefTRIAXone (ROCEPHIN) 1 g in sodium chloride 0.9 % 100 mL IVPB     1 g 200 mL/hr over 30 Minutes Intravenous Every 24 hours 08/04/17 0244     08/03/17 2100  cefTRIAXone (ROCEPHIN) 1 g in sodium chloride 0.9 % 100 mL IVPB     1 g 200 mL/hr over 30 Minutes Intravenous  Once 08/03/17 2058 08/03/17 2248   08/03/17 2100  azithromycin (ZITHROMAX) 500 mg in sodium chloride 0.9 % 250 mL IVPB     500 mg 250 mL/hr over 60 Minutes Intravenous  Once 08/03/17 2058 08/04/17 0135        Subjective:   Dana Abbott was seen and examined today.  No acute complaints today, no chest pain, nausea vomiting.  Right knee pain stable.  No acute issues overnight.  Objective:   Vitals:   08/07/17 0805 08/07/17 1617 08/08/17 0032 08/08/17 0732  BP: (!) 106/50 99/73 107/69 110/80  Pulse: 81 94 93 80  Resp: (!) 22 20 17 19   Temp: 98.1 F (36.7 C) 98.6 F (37 C) 98.2 F (36.8 C)   TempSrc: Oral Oral Oral   SpO2: 93% 94% 96% 95%  Weight:      Height:         Intake/Output Summary (Last 24 hours) at 08/08/2017 1148 Last data filed at 08/08/2017 0836 Gross per 24 hour  Intake 120 ml  Output 2200 ml  Net -2080 ml     Wt Readings from Last 3 Encounters:  08/04/17 (!) 162.7 kg (358 lb 9.6 oz)  11/02/15 (!) 167.4 kg (369 lb)  10/06/15 (!) 168.8 kg (372 lb 1.6 oz)     Exam   General: Alert and oriented x 3, NAD  Eyes:   HEENT:   Cardiovascular: S1 S2 auscultated,  Regular rate and rhythm. pedal edema b/l  Respiratory: Clear to auscultation bilaterally, no wheezing, rales or rhonchi  Gastrointestinal: Obese, soft, nontender, nondistended, + bowel sounds  Ext: no pedal edema bilaterally  Neuro: no new deficits  Musculoskeletal: No digital cyanosis, clubbing  Skin: No rashes  Psych: Normal affect and demeanor, alert and oriented x3    Data Reviewed:  I have personally reviewed following labs and imaging studies  Micro Results Recent Results (from the past 240 hour(s))  Blood culture (routine x 2)     Status: None (Preliminary result)   Collection Time: 08/03/17  8:35 PM  Result Value Ref Range Status   Specimen Description BLOOD RIGHT HAND  Final   Special Requests   Final    BOTTLES DRAWN AEROBIC AND ANAEROBIC Blood Culture adequate volume   Culture   Final    NO GROWTH 4 DAYS Performed at [facility], [address], [place], [place] [address]    Report Status PENDING  Incomplete  Blood culture (routine x 2)     Status: None (Preliminary result)   Collection Time: 08/03/17  8:50 PM  Result Value Ref Range Status   Specimen Description BLOOD LEFT WRIST  Final   Special Requests   Final    BOTTLES DRAWN AEROBIC AND ANAEROBIC Blood Culture adequate volume   Culture   Final    NO GROWTH 4 DAYS Performed at [facility], [address] [place], [place] [address]    Report Status PENDING  Incomplete    Radiology Reports Dg Chest 2 View  Result Date: 08/03/2017 CLINICAL DATA:  Chest pain. EXAM:  CHEST - 2 VIEW COMPARISON:  Chest x-ray and CT chest dated June 02, 2016. FINDINGS: Stable cardiomegaly. Worsening perihilar interstitial opacities. New patchy opacities in the left lower lobe. No pleural effusion or pneumothorax. No acute osseous abnormality. IMPRESSION: 1. Stable cardiomegaly with worsening perihilar edema. 2. New patchy opacities in the left lower lobe, atelectasis versus infiltrate. Electronically Signed   By: Obie Dredge M.D.   On: 08/03/2017 19:28   Ct Angio Chest Pe W Or Wo Contrast  Result Date: 08/07/2017 CLINICAL DATA:  Right-sided chest pain EXAM: CT ANGIOGRAPHY CHEST WITH CONTRAST TECHNIQUE: Multidetector CT imaging of the chest was performed using the standard protocol during bolus administration of intravenous contrast. Multiplanar CT image reconstructions and MIPs were obtained to evaluate the vascular anatomy. CONTRAST:  85 mL Isovue 370. COMPARISON:  08/03/2017 FINDINGS: Cardiovascular: No significant atherosclerotic changes identified. No aneurysmal dilatation of the aorta is seen. The heart is enlarged in size similar to that seen on prior plain film examination. The pulmonary artery shows a normal branching pattern without focal intraluminal filling defect to suggest pulmonary embolism. Main pulmonary artery measures 4 cm in transverse dimension suggestive of pulmonary hypertension. Mediastinum/Nodes: The thoracic inlet is within normal limits. Subcarinal adenopathy is identified which measures 2.4 cm in short axis. Scattered small hilar nodes are noted bilaterally. No other significant lymphadenopathy is seen. Lungs/Pleura: Lungs are well aerated bilaterally. No sizable effusion is noted. Mild left lower lobe atelectatic changes are seen. A mild mosaic attenuation pattern is noted throughout both lungs with mild emphysematous changes identified. This may be related to the underlying enlargement of the pulmonary artery. Upper Abdomen: Visualized upper abdomen is within  normal limits. Musculoskeletal: Degenerative changes of the thoracic spine are noted. Review of the MIP images confirms the above findings. IMPRESSION: Changes most consistent with pulmonary hypertension. No evidence of pulmonary  emboli. Mosaic attenuation pattern within the lungs bilaterally consistent with the changes of pulmonary hypertension. Hilar and mediastinal lymph nodes of uncertain significance. Possibility of prior granulomatous disease deserves consideration (sarcoid) Electronically Signed   By: Alcide Clever M.D.   On: 08/07/2017 11:45   Dg Knee 3 View Right  Result Date: 08/05/2017 CLINICAL DATA:  Recent fall with right knee pain. Pain is along the anterior surface. EXAM: RIGHT KNEE - 3 VIEW COMPARISON:  02/09/2016 FINDINGS: Again noted are severe degenerative changes involving the right knee. Severe medial compartment narrowing with flattening and remodeling of the medial femoral condyle. Mild lateral subluxation of the right knee associated with the degenerative disease. Diffuse osteophytosis in the knee. Negative for an acute fracture or dislocation. Probable suprapatellar joint effusion. Bone detail is limited due to body habitus. IMPRESSION: Severe osteoarthritis in the right knee, particularly in the medial knee compartment. Probable joint effusion. No acute bone abnormality. Electronically Signed   By: Richarda Overlie M.D.   On: 08/05/2017 10:05    Lab Data:  CBC: Recent Labs  Lab 08/04/17 0238 08/04/17 1119 08/05/17 0145 08/06/17 0238 08/07/17 0211 08/08/17 0239  WBC 4.9  --  5.4 5.5 7.3 7.0  NEUTROABS  --   --  3.3  --   --   --   HGB 7.1* 7.8* 8.3* 8.1* 8.2* 8.3*  HCT 27.0* 29.2* 29.8* 30.3* 32.0* 32.4*  MCV 82.3  --  82.3 85.8 86.3 87.8  PLT 355  --  493* 512* 591* 615*   Basic Metabolic Panel: Recent Labs  Lab 08/03/17 1949 08/04/17 0238 08/06/17 0238 08/07/17 0211 08/08/17 0239  NA 140 140 139 141 142  K 4.2 3.8 4.3 4.1 4.2  CL 104 103 101 99* 97*  CO2 28 27  30  34* 37*  GLUCOSE 98 101* 96 102* 102*  BUN 8 5* 6 5* 6  CREATININE 0.50 0.41* 0.47 0.45 0.49  CALCIUM 8.1* 8.1* 8.4* 8.4* 8.6*   GFR: Estimated Creatinine Clearance: 118.4 mL/min (by C-G formula based on SCr of 0.49 mg/dL). Liver Function Tests: Recent Labs  Lab 08/03/17 1949  AST 19  ALT 12*  ALKPHOS 80  BILITOT 0.5  PROT 6.7  ALBUMIN 3.0*   No results for input(s): LIPASE, AMYLASE in the last 168 hours. No results for input(s): AMMONIA in the last 168 hours. Coagulation Profile: Recent Labs  Lab 08/08/17 0706  INR 1.11   Cardiac Enzymes: Recent Labs  Lab 08/03/17 1949  TROPONINI <0.03   BNP (last 3 results) No results for input(s): PROBNP in the last 8760 hours. HbA1C: No results for input(s): HGBA1C in the last 72 hours. CBG: No results for input(s): GLUCAP in the last 168 hours. Lipid Profile: No results for input(s): CHOL, HDL, LDLCALC, TRIG, CHOLHDL, LDLDIRECT in the last 72 hours. Thyroid Function Tests: No results for input(s): TSH, T4TOTAL, FREET4, T3FREE, THYROIDAB in the last 72 hours. Anemia Panel: No results for input(s): VITAMINB12, FOLATE, FERRITIN, TIBC, IRON, RETICCTPCT in the last 72 hours. Urine analysis:    Component Value Date/Time   COLORURINE YELLOW 07/31/2015 0154   APPEARANCEUR CLEAR 07/31/2015 0154   LABSPEC 1.007 07/31/2015 0154   PHURINE 6.0 07/31/2015 0154   GLUCOSEU NEGATIVE 07/31/2015 0154   HGBUR MODERATE (A) 07/31/2015 0154   BILIRUBINUR NEGATIVE 07/31/2015 0154   KETONESUR NEGATIVE 07/31/2015 0154   PROTEINUR NEGATIVE 07/31/2015 0154   UROBILINOGEN 4.0 (H) 03/09/2010 1245   NITRITE NEGATIVE 07/31/2015 0154   LEUKOCYTESUR TRACE (A) 07/31/2015 0154  Thad Ranger M.D. [facility] 08/08/2017, 11:48 AM  Pager: [phone] Between 7am to 7pm - call Pager - [phone]  After 7pm go to [email or web address] - password [email or web address]  Call night coverage person covering after 7pm

## 2017-08-09 ENCOUNTER — Encounter (HOSPITAL_COMMUNITY): Payer: Self-pay | Admitting: Internal Medicine

## 2017-08-09 ENCOUNTER — Other Ambulatory Visit: Payer: Self-pay

## 2017-08-09 ENCOUNTER — Inpatient Hospital Stay (HOSPITAL_COMMUNITY)
Admission: RE | Admit: 2017-08-09 | Discharge: 2017-08-22 | DRG: 945 | Disposition: A | Discharge status: Home Health | Payer: Medicaid Other | Source: Intra-hospital | Attending: Physical Medicine & Rehabilitation | Admitting: Physical Medicine & Rehabilitation

## 2017-08-09 DIAGNOSIS — D72819 Decreased white blood cell count, unspecified: Secondary | ICD-10-CM

## 2017-08-09 DIAGNOSIS — D696 Thrombocytopenia, unspecified: Secondary | ICD-10-CM | POA: Diagnosis present

## 2017-08-09 DIAGNOSIS — K5900 Constipation, unspecified: Secondary | ICD-10-CM | POA: Diagnosis present

## 2017-08-09 DIAGNOSIS — Z9884 Bariatric surgery status: Secondary | ICD-10-CM

## 2017-08-09 DIAGNOSIS — Z86718 Personal history of other venous thrombosis and embolism: Secondary | ICD-10-CM | POA: Diagnosis not present

## 2017-08-09 DIAGNOSIS — D638 Anemia in other chronic diseases classified elsewhere: Secondary | ICD-10-CM | POA: Diagnosis present

## 2017-08-09 DIAGNOSIS — Z825 Family history of asthma and other chronic lower respiratory diseases: Secondary | ICD-10-CM

## 2017-08-09 DIAGNOSIS — D509 Iron deficiency anemia, unspecified: Secondary | ICD-10-CM | POA: Diagnosis present

## 2017-08-09 DIAGNOSIS — Z8673 Personal history of transient ischemic attack (TIA), and cerebral infarction without residual deficits: Secondary | ICD-10-CM

## 2017-08-09 DIAGNOSIS — J9611 Chronic respiratory failure with hypoxia: Secondary | ICD-10-CM | POA: Diagnosis present

## 2017-08-09 DIAGNOSIS — Z6841 Body Mass Index (BMI) 40.0 and over, adult: Secondary | ICD-10-CM | POA: Diagnosis not present

## 2017-08-09 DIAGNOSIS — Z791 Long term (current) use of non-steroidal anti-inflammatories (NSAID): Secondary | ICD-10-CM

## 2017-08-09 DIAGNOSIS — I5032 Chronic diastolic (congestive) heart failure: Secondary | ICD-10-CM | POA: Diagnosis present

## 2017-08-09 DIAGNOSIS — I252 Old myocardial infarction: Secondary | ICD-10-CM

## 2017-08-09 DIAGNOSIS — Z9981 Dependence on supplemental oxygen: Secondary | ICD-10-CM

## 2017-08-09 DIAGNOSIS — J9610 Chronic respiratory failure, unspecified whether with hypoxia or hypercapnia: Secondary | ICD-10-CM

## 2017-08-09 DIAGNOSIS — Z91013 Allergy to seafood: Secondary | ICD-10-CM

## 2017-08-09 DIAGNOSIS — Z823 Family history of stroke: Secondary | ICD-10-CM

## 2017-08-09 DIAGNOSIS — R5381 Other malaise: Secondary | ICD-10-CM | POA: Diagnosis present

## 2017-08-09 DIAGNOSIS — I5033 Acute on chronic diastolic (congestive) heart failure: Secondary | ICD-10-CM

## 2017-08-09 DIAGNOSIS — Z87891 Personal history of nicotine dependence: Secondary | ICD-10-CM

## 2017-08-09 DIAGNOSIS — G4733 Obstructive sleep apnea (adult) (pediatric): Secondary | ICD-10-CM | POA: Diagnosis present

## 2017-08-09 DIAGNOSIS — I2510 Atherosclerotic heart disease of native coronary artery without angina pectoris: Secondary | ICD-10-CM | POA: Diagnosis present

## 2017-08-09 DIAGNOSIS — Z833 Family history of diabetes mellitus: Secondary | ICD-10-CM

## 2017-08-09 DIAGNOSIS — E876 Hypokalemia: Secondary | ICD-10-CM | POA: Diagnosis not present

## 2017-08-09 DIAGNOSIS — Z8249 Family history of ischemic heart disease and other diseases of the circulatory system: Secondary | ICD-10-CM

## 2017-08-09 DIAGNOSIS — J181 Lobar pneumonia, unspecified organism: Secondary | ICD-10-CM | POA: Diagnosis present

## 2017-08-09 DIAGNOSIS — I272 Pulmonary hypertension, unspecified: Secondary | ICD-10-CM | POA: Diagnosis present

## 2017-08-09 DIAGNOSIS — M778 Other enthesopathies, not elsewhere classified: Secondary | ICD-10-CM

## 2017-08-09 DIAGNOSIS — M1711 Unilateral primary osteoarthritis, right knee: Secondary | ICD-10-CM | POA: Diagnosis present

## 2017-08-09 LAB — BASIC METABOLIC PANEL
ANION GAP: 11 (ref 5–15)
BUN: 5 mg/dL — ABNORMAL LOW (ref 6–20)
CALCIUM: 8.6 mg/dL — AB (ref 8.9–10.3)
CHLORIDE: 97 mmol/L — AB (ref 101–111)
CO2: 30 mmol/L (ref 22–32)
CREATININE: 0.49 mg/dL (ref 0.44–1.00)
GFR calc non Af Amer: >60 mL/min (ref >60)
Glucose, Bld: 127 mg/dL — ABNORMAL HIGH (ref 65–99)
Potassium: 4.2 mmol/L (ref 3.5–5.1)
SODIUM: 138 mmol/L (ref 135–145)

## 2017-08-09 LAB — CBC
HCT: 33.8 % — ABNORMAL LOW (ref 36.0–46.0)
HEMOGLOBIN: 8.9 g/dL — AB (ref 12.0–15.0)
MCH: 23.5 pg — ABNORMAL LOW (ref 26.0–34.0)
MCHC: 26.3 g/dL — ABNORMAL LOW (ref 30.0–36.0)
MCV: 89.2 fL (ref 78.0–100.0)
Platelets: 598 10*3/uL — ABNORMAL HIGH (ref 150–400)
RBC: 3.79 MIL/uL — ABNORMAL LOW (ref 3.87–5.11)
RDW: 26 % — AB (ref 11.5–15.5)
WBC: 6.6 10*3/uL (ref 4.0–10.5)

## 2017-08-09 MED ORDER — CEFDINIR 300 MG PO CAPS
300.0000 mg | ORAL_CAPSULE | Freq: Two times a day (BID) | ORAL | Status: DC
  Administered 2017-08-09 – 2017-08-14 (×10): 300 mg via ORAL
  Filled 2017-08-09 (×10): qty 1

## 2017-08-09 MED ORDER — ENOXAPARIN SODIUM 40 MG/0.4ML ~~LOC~~ SOLN: 40.0000 mg | SUBCUTANEOUS | Status: DC

## 2017-08-09 MED ORDER — TORSEMIDE 20 MG PO TABS: ORAL_TABLET | ORAL | 2 refills | Status: DC

## 2017-08-09 MED ORDER — TORSEMIDE 20 MG PO TABS
40.0000 mg | ORAL_TABLET | Freq: Every day | ORAL | Status: DC
  Filled 2017-08-09 (×2): qty 2

## 2017-08-09 MED ORDER — GUAIFENESIN ER 600 MG PO TB12
600.0000 mg | ORAL_TABLET | Freq: Two times a day (BID) | ORAL | Status: DC
  Administered 2017-08-09 – 2017-08-22 (×26): 600 mg via ORAL
  Filled 2017-08-09 (×26): qty 1

## 2017-08-09 MED ORDER — FERROUS SULFATE 325 (65 FE) MG PO TABS
325.0000 mg | ORAL_TABLET | Freq: Three times a day (TID) | ORAL | Status: DC
  Administered 2017-08-09 – 2017-08-22 (×39): 325 mg via ORAL
  Filled 2017-08-09 (×40): qty 1

## 2017-08-09 MED ORDER — TORSEMIDE 20 MG PO TABS
40.0000 mg | ORAL_TABLET | Freq: Every day | ORAL | Status: DC
  Administered 2017-08-09: 40 mg via ORAL
  Filled 2017-08-09: qty 2

## 2017-08-09 MED ORDER — TORSEMIDE 20 MG PO TABS: 20.0000 mg | ORAL_TABLET | Freq: Two times a day (BID) | ORAL | Status: DC

## 2017-08-09 MED ORDER — SORBITOL 70 % SOLN: 30.0000 mL | Freq: Every day | Status: DC | PRN

## 2017-08-09 MED ORDER — ACETAMINOPHEN 325 MG PO TABS
650.0000 mg | ORAL_TABLET | ORAL | Status: DC | PRN
  Administered 2017-08-10 – 2017-08-21 (×4): 650 mg via ORAL
  Filled 2017-08-09 (×5): qty 2

## 2017-08-09 MED ORDER — TORSEMIDE 20 MG PO TABS: 20.0000 mg | ORAL_TABLET | Freq: Every day | ORAL | Status: DC

## 2017-08-09 MED ORDER — DICLOFENAC SODIUM 1 % TD GEL
2.0000 g | Freq: Three times a day (TID) | TRANSDERMAL | Status: DC
  Administered 2017-08-10 – 2017-08-20 (×22): 2 g via TOPICAL
  Filled 2017-08-09: qty 100

## 2017-08-09 MED ORDER — ALBUTEROL SULFATE (2.5 MG/3ML) 0.083% IN NEBU: 2.5000 mg | INHALATION_SOLUTION | RESPIRATORY_TRACT | Status: DC | PRN

## 2017-08-09 MED ORDER — CYANOCOBALAMIN 1000 MCG/ML IJ SOLN
1000.0000 ug | Freq: Every day | INTRAMUSCULAR | Status: DC
  Administered 2017-08-10: 1000 ug via INTRAMUSCULAR
  Filled 2017-08-09 (×2): qty 1

## 2017-08-09 MED ORDER — POTASSIUM CHLORIDE CRYS ER 20 MEQ PO TBCR
20.0000 meq | EXTENDED_RELEASE_TABLET | Freq: Every day | ORAL | Status: DC
  Administered 2017-08-10 – 2017-08-17 (×8): 20 meq via ORAL
  Filled 2017-08-09 (×8): qty 1

## 2017-08-09 MED ORDER — MELOXICAM 7.5 MG PO TABS
15.0000 mg | ORAL_TABLET | Freq: Every day | ORAL | Status: DC
  Administered 2017-08-10 – 2017-08-22 (×13): 15 mg via ORAL
  Filled 2017-08-09 (×14): qty 2

## 2017-08-09 MED ORDER — AZITHROMYCIN 500 MG PO TABS: 500.0000 mg | ORAL_TABLET | Freq: Every day | ORAL | 0 refills | Status: DC

## 2017-08-09 MED ORDER — CEFDINIR 300 MG PO CAPS: 300.0000 mg | ORAL_CAPSULE | Freq: Two times a day (BID) | ORAL | 0 refills | Status: DC

## 2017-08-09 MED ORDER — HYDROCODONE-ACETAMINOPHEN 5-325 MG PO TABS
1.0000 | ORAL_TABLET | Freq: Four times a day (QID) | ORAL | Status: DC | PRN
  Administered 2017-08-09 – 2017-08-21 (×12): 2 via ORAL
  Filled 2017-08-09 (×15): qty 2

## 2017-08-09 MED ORDER — TORSEMIDE 20 MG PO TABS
20.0000 mg | ORAL_TABLET | Freq: Every day | ORAL | Status: DC
Start: 2017-08-09 — End: 2017-08-13
  Administered 2017-08-10 – 2017-08-12 (×3): 20 mg via ORAL
  Filled 2017-08-09 (×5): qty 1

## 2017-08-09 MED ORDER — ENOXAPARIN SODIUM 40 MG/0.4ML ~~LOC~~ SOLN
40.0000 mg | SUBCUTANEOUS | Status: DC
  Administered 2017-08-10 – 2017-08-20 (×11): 40 mg via SUBCUTANEOUS
  Filled 2017-08-09 (×11): qty 0.4

## 2017-08-09 MED ORDER — ONDANSETRON HCL 4 MG PO TABS: 4.0000 mg | ORAL_TABLET | Freq: Four times a day (QID) | ORAL | Status: DC | PRN

## 2017-08-09 MED ORDER — ONDANSETRON HCL 4 MG/2ML IJ SOLN: 4.0000 mg | Freq: Four times a day (QID) | INTRAMUSCULAR | Status: DC | PRN

## 2017-08-09 MED ORDER — CEFDINIR 300 MG PO CAPS
300.0000 mg | ORAL_CAPSULE | Freq: Two times a day (BID) | ORAL | Status: DC
  Filled 2017-08-09: qty 1

## 2017-08-09 MED FILL — Heparin Sod (Porcine)-NaCl IV Soln 1000 Unit/500ML-0.9%: INTRAVENOUS | Qty: 1000 | Status: AC

## 2017-08-09 NOTE — Progress Notes (Signed)
PT Cancellation Note  Patient Details Name: Dana Abbott MRN: [id number] DOB: 1966/04/21   Cancelled Treatment:    Reason Eval/Treat Not Completed: Patient declined, no reason specified. Patient politely declined PT since she was about to receive a bath. Planned transfer to CIR this afternoon.  Laurina Bustle, PT, DPT Acute Rehabilitation Services  Pager: [phone]    Vanetta Mulders 08/09/2017, 1:34 PM

## 2017-08-09 NOTE — IPOC Note (Addendum)
Overall Plan of Care [facility]) Patient Details Name: Dana Abbott MRN: [id number] DOB: Jul 13, 1965  Admitting Diagnosis: Debility  Hospital Problems: Active Problems:   Acute on chronic diastolic heart failure (HCC)   Debility   Chronic respiratory failure (HCC)   Supplemental oxygen dependent   Anemia of chronic disease     Functional Problem List: Nursing Edema, Safety, Endurance, Bladder  PT Balance, Edema, Endurance, Pain, Safety  OT Balance, Edema, Endurance, Pain  SLP    TR         Basic ADL's: OT Grooming, Dressing, Bathing, Toileting     Advanced  ADL's: OT Simple Meal Preparation     Transfers: PT Bed Mobility, Bed to Chair, Car, Occupational psychologist, Research scientist (life sciences): PT Psychologist, prison and probation services, Stairs, Ambulation     Additional Impairments: OT    SLP        TR      Anticipated Outcomes Item Anticipated Outcome  Self Feeding no goal  Swallowing      Basic self-care  MOD I  Toileting  MOD I   Bathroom Transfers MOD I toilet; S shower  Bowel/Bladder  Patient will manage Bowel/Bladder with minimal asssist.   Transfers  Mod I with LRAD   Locomotion  Ambulatory for household distance Mod I   Communication     Cognition     Pain  Patient will manage pain at 3 or less out of 10.   Safety/Judgment  Pattient will remain free of falls during rehabilitation with minimal assist/cues.    Therapy Plan: PT Intensity: Minimum of 1-2 x/day ,45 to 90 minutes PT Frequency: 5 out of 7 days PT Duration Estimated Length of Stay: 10-12 OT Intensity: Minimum of 1-2 x/day, 45 to 90 minutes OT Frequency: 5 out of 7 days OT Duration/Estimated Length of Stay: (12-14)      Team Interventions: Nursing Interventions Patient/Family Education, Pain Management, Psychosocial Support, Bladder Management, Medication Management  PT interventions Ambulation/gait training, Warden/ranger, Community reintegration, Discharge planning, Disease  management/prevention, Functional mobility training, DME/adaptive equipment instruction, Neuromuscular re-education, Pain management, Patient/family education, Splinting/orthotics, Skin care/wound management, Psychosocial support, Stair training, Therapeutic Activities, Therapeutic Exercise, Visual/perceptual remediation/compensation, UE/LE Coordination activities, UE/LE Strength taining/ROM, Wheelchair propulsion/positioning  OT Interventions Warden/ranger, Discharge planning, Pain management, Self Care/advanced ADL retraining, Therapeutic Activities, UE/LE Coordination activities, Disease mangement/prevention, Functional mobility training, Patient/family education, Skin care/wound managment, Therapeutic Exercise, Community reintegration, Fish farm manager, Psychosocial support, UE/LE Strength taining/ROM  SLP Interventions    TR Interventions    SW/CM Interventions Discharge Planning, Psychosocial Support, Patient/Family Education   Barriers to Discharge MD  Medical stability and Weight  Nursing      PT Inaccessible home environment, Decreased caregiver support, Medical stability, Weight    OT Incontinence    SLP      SW       Team Discharge Planning: Destination: PT-Home ,OT- Home , SLP-  Projected Follow-up: PT-Home health PT, OT-  Home health OT, SLP-  Projected Equipment Needs: PT-Rolling walker with 5" wheels, Wheelchair cushion (measurements), Wheelchair (measurements), OT- 3 in 1 bedside comode, To be determined, SLP-  Equipment Details: PT- , OT-has shower bench Patient/family involved in discharge planning: PT- Patient,  OT-Patient, SLP-   MD ELOS: 10-14 days. Medical Rehab Prognosis:  Good Assessment: 52 year old female with history of OSAwith home oxygen 1.5 L as needed, CAD, CHF (prior CIR stay for debility), gastric bypass with chronic irons deficiency anemia, morbid obesity, who was admitted on  08/03/17 with reports of fall onto right chest  and knee a week PTA with progressive weakness, increase in BLE edema, right knee pain and SOB. Work up revealed acute on chronic diastolic CHF with CAP LLL and anemia with Hgb 8.3. She was treated with IV diuresis and started on Rocephin and Zithromax. She received one unit PRBC for anemia and was started on B12 supplementation. She has had severe pain right knee and X rays revealed severe OA. Dr. Charlann Boxer consulted for input and knee injected on 4/12 with DepoMedrol. Ortho recommended weight loss (needs BMI <40 for surgical intervention), local measures as well as resumption of Mobic. She continues to have hypoxia with activity and 2D echo done revealing EF 65-70% with no wall abnormality, left ventricular septum with diastolic/systolic flattening and increased right ventricular pressures consistent with severe pulmonary HTN. Cardiology consulted for input with cardiaccatheterization4/17/2019with normal coronary arteries.Patient remains on Demadex. CTA lungs done to rule out PE and showed changes consistent with pulmonary HTN and hilar and mediastinal lymph nodes of uncertain significance (question prior granulomatous disease).Patient with resulting functional deficits with mobility, self-care, endurance.  Will set goals for Mod I with PT/OT.   See Team Conference Notes for weekly updates to the plan of care

## 2017-08-09 NOTE — Progress Notes (Addendum)
Advanced Heart Failure Rounding Note  PCP-Cardiologist: No primary care provider on file.   Subjective:    Negative another 2.3 L. Weights have not been kept.  Feeling OK today.  Slightly fatigued. Denies SOB or further CP. Going to CIR Cath sites ok    [facility] 08/08/17 - Normal Coronary Arteries Hemodynamics: Ao =118/78 (96) LV =119/24 RA = 16 RV = 73/19 PA = 80/31 (51) PCW = 19 Fick cardiac output/index = 7.8/3.37 PVR = 4.1 WU Ao sat = 93% PA sat = 55%, 60%  Assessment: 1. Normal coronary arteries 2. LVEF 60-65% 3. 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 Suspect Fick CO overestimated due to body habitus  Objective:   Weight Range: (!) 350 lb 8.5 oz (159 kg) Body mass index is 75.85 kg/m.   Vital Signs:   Temp:  [98.2 F (36.8 C)-98.6 F (37 C)] 98.2 F (36.8 C) (04/18 0431) Pulse Rate:  [65-116] 78 (04/18 0431) Resp:  [12-29] 18 (04/18 0431) BP: (91-150)/(54-111) 91/54 (04/18 0431) SpO2:  [83 %-99 %] 96 % (04/18 0431) Weight:  [350 lb 8.5 oz (159 kg)] 350 lb 8.5 oz (159 kg) (04/18 0431) Last BM Date: 08/07/17  Weight change: Filed Weights   08/04/17 0020 08/04/17 2243 08/09/17 0431  Weight: (!) 357 lb 2.3 oz (162 kg) (!) 358 lb 9.6 oz (162.7 kg) (!) 350 lb 8.5 oz (159 kg)    Intake/Output:   Intake/Output Summary (Last 24 hours) at 08/09/2017 0834 Last data filed at 08/08/2017 2324 Gross per 24 hour  Intake 889.17 ml  Output 3200 ml  Net -2310.83 ml      Physical Exam    General: In bed  NAD.  HEENT: Normal. Anicteric On [place]. Neck: Supple. JVP difficult due to body habitus. Carotids 2+ bilat; no bruits. No thyromegaly or nodule noted. Cor: PMI nondisplaced. RRR, 2/6 TR. +RV lift Lungs: CTAB, normal effort. Abdomen: Markedly obese Soft, non-tender, non-distended, no HSM. No bruits or masses. +BS  Extremities: No cyanosis, clubbing, or rash. R and LLE no edema. R radial and brachial sites ok.   Neuro: Alert & orientedx3, cranial nerves  grossly intact. moves all 4 extremities w/o difficulty. Affect pleasant   Telemetry   NSR 70s, personally reviewed.  EKG    No new tracings.     Labs    CBC Recent Labs    08/07/17 0211 08/08/17 0239  WBC 7.3 7.0  HGB 8.2* 8.3*  HCT 32.0* 32.4*  MCV 86.3 87.8  PLT 591* 615*   Basic Metabolic Panel Recent Labs    21/30/86 0211 08/08/17 0239  NA 141 142  K 4.1 4.2  CL 99* 97*  CO2 34* 37*  GLUCOSE 102* 102*  BUN 5* 6  CREATININE 0.45 0.49  CALCIUM 8.4* 8.6*   Liver Function Tests No results for input(s): AST, ALT, ALKPHOS, BILITOT, PROT, ALBUMIN in the last 72 hours. No results for input(s): LIPASE, AMYLASE in the last 72 hours. Cardiac Enzymes No results for input(s): CKTOTAL, CKMB, CKMBINDEX, TROPONINI in the last 72 hours.  BNP: BNP (last 3 results) Recent Labs    08/03/17 1949  BNP 264.8*    ProBNP (last 3 results) No results for input(s): PROBNP in the last 8760 hours.   D-Dimer Recent Labs    08/06/17 1629  DDIMER 4.99*   Hemoglobin A1C No results for input(s): HGBA1C in the last 72 hours. Fasting Lipid Panel No results for input(s): CHOL, HDL, LDLCALC, TRIG, CHOLHDL,  LDLDIRECT in the last 72 hours. Thyroid Function Tests No results for input(s): TSH, T4TOTAL, T3FREE, THYROIDAB in the last 72 hours.  Invalid input(s): FREET3  Other results:   Imaging    No results found.   Medications:     Scheduled Medications: . cyanocobalamin  1,000 mcg Intramuscular Daily  . diclofenac sodium  2 g Topical TID  . enoxaparin (LOVENOX) injection  40 mg Subcutaneous Q24H  . ferrous sulfate  325 mg Oral TID WC  . furosemide  40 mg Intravenous Q12H  . guaiFENesin  600 mg Oral BID  . lidocaine  10 mL Other Once  . meloxicam  15 mg Oral Daily  . methylPREDNISolone acetate  80 mg Intra-articular Once  . potassium chloride  20 mEq Oral Daily  . sodium chloride flush  3 mL Intravenous Q12H  . sodium chloride flush  3 mL Intravenous Q12H     Infusions: . sodium chloride    . 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 Former smoker. Uses oxygen as needed at night.  Admitted with chest pain and increased shortness of breath.     Assessment/Plan   1. Chest pain - Troponin < 0.03.  - Normal coronaries by cath, despite report of MI years ago.   2. Symptomatic Anemia - iron and b12 deficiency - On iron and b12. - Hgb on admit 7.1. 8.3 08/08/17. FOBT negative.  - Received Feraheme this admit.   3. A/C Diastolic Heart Failure--> Severe RV Failure ECHO with elevated filling pressures. EF 60%.  - Volume status improved.  - Stop IV lasix.  - Switch to torsemide 40 mg q am and 20 mg q pm with better absorption than lasix. Can adjust as needed.  - RHC 08/08/17 with mod/sev PAH likely due to OHS.   4. Acute/Chronic Hypoxic Respiratory Failure secondary to CAP.  On rocephin and zithromycin.  5. Pulmonary Hypertension PA pressures high on ECHO.  - Suspect primarily WHO Group III - RHC 08/08/17 PA pressures 80/31 (Mean). Suspect likely due to OHS.  - H/O DVT . CTA- negative for PE - Diuretics as above.  - Not a candidate for selective pulmonary vasodilators.   6 Chronic Hypoxic Respiratory Failure - Sats < 88% with ambulation. She will need O2 with ambulation for home - Needs formal sleep study as outpatient.   7. Mobid Obesity - Body mass index is 75.85 kg/m.  - Had gastric bypass surgery in 2004. Initially lost 200 pounds but has gained back 50 pounds.  - Encouraged weight loss.  8. Deconditioning - PT recommending  CIR. We strongly agree.   Volume status improved. RHC with likely WHO group III PAH. Focus on diuresis, weight loss, and conditioning.   Medication concerns reviewed with patient and pharmacy team. Barriers identified: none  Length of Stay: 5  Graciella Freer, PA-C  08/09/2017, 8:34 AM  Advanced Heart Failure Team Pager [phone] (M-F; 7a - 4p)  Please contact [facility] for night-coverage after hours (4p -7a ) and weekends on [email or web address]  Patient seen and examined with the above-signed Advanced Practice Provider and/or Housestaff. I personally reviewed laboratory data,  imaging studies and relevant notes. I independently examined the patient and formulated the important aspects of the plan. I have edited the note to reflect any of my changes or salient points. I have personally discussed the plan with the patient and/or family.  Cath sites ok. Cath results reviewed with her. She has moderate PAH due to OHS. Not candidate for selective vasodilators. Reinforced need for using O2 and marked weight loss. Will start torsemide 40/20. Ok for [employer]. Watch renal function and electrolytes; adjust diuretics accordingly.   Arvilla Meres, MD  9:29 PM

## 2017-08-09 NOTE — Progress Notes (Signed)
52 y.o. Female admitted to 4M10 with Daughter at bedside. Patient alert and oriented x 4. Vitals WNL's. Skin assessment rendered x 2 RN's. Patient educated on Safety plan with understanding verbalized. Personal belongings at bedside.

## 2017-08-09 NOTE — Progress Notes (Addendum)
Inpatient Rehabilitation  I await acute medical clearance to clarify if patient is ready to admit to IP Rehab today.  I have a bed available to offer patient today.  Plan to update team when I know.  Call if questions.   Update: I have medical clearance and plan to proceed admission today.    Charlane Ferretti., CCC/SLP Admission Coordinator  [facility] Inpatient Rehabilitation  Cell [phone]

## 2017-08-09 NOTE — H&P (Signed)
Physical Medicine and Rehabilitation Admission H&P        Chief Complaint  Patient presents with  . Debility  . Shortness of Breath      HPI: Dana Abbott is a 52 year old female with history of OSA with home oxygen 1.5 L as needed, CAD, CHF (prior CIR stay for debility), gastric bypass with chronic irons deficiency anemia, morbid obesity, who was admitted on 08/03/17 with reports of fall onto right chest and knee a week PTA with progressive weakness, increase in BLE edema, right knee pain and SOB.  Work up revealed acute on chronic diastolic CHF with CAP LLL and anemia with Hgb 8.3. She was treated with IV diuresis and started on Rocephin and Zithromax. She received one unit PRBC for anemia and was started on B12 supplementation.  She has had severe pain right knee and X rays revealed severe OA.   Dr. Alvan Dame consulted for input and knee injected on 4/12 with DepoMedrol. Ortho recommended weight loss  (needs BMI < 40 for surgical intervention), local measures as well as resumption of Mobic.  She continues to have hypoxia with activity and 2D echo done revealing EF 65-70% with no wall abnormality, left ventricular septum with diastolic/systolic flattening and increased right ventricular pressures consistent with severe pulmonary HTN. Cardiology consulted for input with cardiac catheterization 08/08/2017 with normal coronary arteries.  Patient remains on Demadex. CTA lungs done to rule out PE and showed changes consistent with pulmonary HTN and hilar and mediastinal lymph nodes of uncertain significance (question prior granulomatous disease).  Subcutaneous Lovenox for DVT prophylaxis.  PT and OT evaluations completed with recommendations of physical medicine rehab consult.  Patient was admitted for a comprehensive rehab program.   Patient is using peri-wick Patient has been off O2 for about 1-1/2 hours but has no shortness of breath  Right knee pain persist despite knee injection   Review of Systems   Constitutional: Positive for malaise/fatigue. Negative for chills and fever.  HENT: Negative for hearing loss.   Eyes: Negative for blurred vision and double vision.  Respiratory: Positive for shortness of breath.   Cardiovascular: Positive for leg swelling. Negative for chest pain and palpitations.  Gastrointestinal: Positive for constipation. Negative for nausea and vomiting.  Genitourinary: Negative for dysuria, flank pain and hematuria.  Musculoskeletal: Positive for joint pain and myalgias.  Skin: Negative for rash.  All other systems reviewed and are negative.           Past Medical History:  Diagnosis Date  . Anemia      Reportedly from heavy menses and pernicious anemia  . Arthritis      "knees" (07/31/2015)  . CHF (congestive heart failure) ([facility]) ?2003  . DVT (deep venous thrombosis) ([place]) "after my heart attack"    "one of my legs"  . Heavy menses    . History of blood transfusion "I've had alot"    "I'm anemic"  . Myocardial infarction [facility]) 2003?  . OSA (obstructive sleep apnea) 2003  . Stroke [facility]) ?2003           Past Surgical History:  Procedure Laterality Date  . CARPAL TUNNEL RELEASE Right    . [place]; 1991; 1993  . CHOLECYSTECTOMY   2004    w/gastric OR  . GASTRIC BYPASS   2004  . TONSILLECTOMY               Family History  Problem Relation Age of Onset  . Stroke  Mother    . Hypertension Mother    . COPD Father    . Diabetes type II Daughter    . Cancer Neg Hx        Social History:  Lives with family.  reports that she quit smoking about 26 years ago. Her smoking use included cigarettes. She started smoking about 29 years ago. She has a 2.00 pack-year smoking history. She has never used smokeless tobacco. She reports that she does not drink alcohol or use drugs.           Allergies  Allergen Reactions  . Shellfish Allergy        Rash             Medications Prior to Admission  Medication Sig Dispense Refill  .  acetaminophen (TYLENOL) 325 MG tablet Take 2 tablets (650 mg total) by mouth every 6 (six) hours as needed for mild pain. (Patient taking differently: Take 975 mg by mouth every 6 (six) hours as needed for mild pain (Taking 3 tabs every 6 hours.). )      . ferrous sulfate 325 (65 FE) MG tablet Take 1 tablet (325 mg total) by mouth 3 (three) times daily with meals. (Patient taking differently: Take 325 mg by mouth 2 (two) times daily with a meal. ) 90 tablet 0  . meloxicam (MOBIC) 15 MG tablet Take 1 tablet (15 mg total) by mouth daily. 30 tablet 4  . [DISCONTINUED] furosemide (LASIX) 40 MG tablet Take 1 tablet (40 mg total) by mouth 2 (two) times daily. 60 tablet 0  . [DISCONTINUED] Cholecalciferol 10000 units TABS Take 10,000 Units by mouth daily. (Patient not taking: Reported on 08/03/2017) 60 tablet 0  . [DISCONTINUED] potassium chloride SA (K-DUR,KLOR-CON) 20 MEQ tablet Take 1 tablet (20 mEq total) by mouth daily. 30 tablet 0  . [DISCONTINUED] vitamin B-12 (CYANOCOBALAMIN) 100 MCG tablet Take 1 tablet (100 mcg total) by mouth 2 (two) times daily. (Patient not taking: Reported on 11/02/2015) 60 tablet 0      Drug Regimen Review Regimen was reviewed and remains appropriate with no significant issues identified   Home: Home Living Family/patient expects to be discharged to:: Private residence Living Arrangements: Alone Available Help at Discharge: Family, Available PRN/intermittently(Daughter lives near; pt helps give care for 36 yo grandson) Type of Home: Apartment Home Access: Level entry Home Layout: One level   Functional History: Prior Function Level of Independence: Independent, Independent with assistive device(s) Comments: Rolling walker for amb; drives, cares fo rgrandson   Functional Status:  Mobility: Bed Mobility Overal bed mobility: Needs Assistance Bed Mobility: Supine to Sit, Sit to Supine Supine to sit: Min guard Sit to supine: Mod assist General bed mobility comments:  [place]ard for safety with pt coming to sit; reliant on bedrails and momentum for scoot to EOB; VERY inefficient reciprocal scoot backwards, shifts weight laterally, but decr anterior shift of upper trunk to take advantage of head-hips relationship; Mod assist to help LEs into bed Transfers Overall transfer level: Needs assistance Equipment used: Rolling walker (2 wheeled)(Bari RW on shrotest setting) Transfers: Sit to/from Stand Sit to Stand: +2 safety/equipment, Mod assist General transfer comment: Dependent on momentum, with significant trunk flexion to get weight over feet; initial stand unable to get knees fully extended and had to sit back to bed; bil support; Mod assist to steady and power up; More painful R knee today, with decr tolerance of standing; 3 attepmpts to stand before successfully standing to RW Ambulation/Gait Ambulation/Gait  assistance: Mod assist, +2 safety/equipment(Very close chair follow) Ambulation Distance (Feet): 15 Feet Assistive device: Rolling walker (2 wheeled) Gait Pattern/deviations: Decreased step length - right, Decreased step length - left, Shuffle General Gait Details: Unable today; Limited by R knee pain and to go for imaging    ADL:   Cognition: Cognition Overall Cognitive Status: Within Functional Limits for tasks assessed Orientation Level: Oriented X4 Cognition Arousal/Alertness: Awake/alert Behavior During Therapy: WFL for tasks assessed/performed Overall Cognitive Status: Within Functional Limits for tasks assessed     Blood pressure (!) 106/50, pulse 81, temperature 98.1 F (36.7 C), temperature source Oral, resp. rate (!) 22, height '4\' 9"'  (1.448 m), weight (!) 162.7 kg (358 lb 9.6 oz), SpO2 93 %. Physical Exam  Vitals reviewed. Constitutional: She is oriented to person, place, and time.  52 year old right-handed morbidly obese female  HENT:  Head: Normocephalic.  Eyes: EOM are normal. Right eye exhibits no discharge. Left eye exhibits no  discharge.  Neck: Normal range of motion. Neck supple. No thyromegaly present.  Cardiovascular: Normal rate, regular rhythm and normal heart sounds.  Respiratory: Effort normal and breath sounds normal. No respiratory distress. She has no wheezes.  GI: Soft. Bowel sounds are normal. She exhibits no distension. There is no tenderness.  Musculoskeletal:  Right knee with mild swelling  Neurological: She is alert and oriented to person, place, and time.  Skin: Skin is warm and dry.   Sacrum has no evidence of skin breakdown.  Motor strength is 5/5 bilateral deltoid bicep tricep grip 2- hip flexion 4 at the ankle dorsiflexors and plantar flexors Sensation intact light touch in bilateral upper and lower limbs    Lab Results Last 48 Hours        Results for orders placed or performed during the hospital encounter of 08/03/17 (from the past 48 hour(s))  CBC     Status: Abnormal    Collection Time: 08/06/17  2:38 AM  Result Value Ref Range    WBC 5.5 4.0 - 10.5 K/uL    RBC 3.53 (L) 3.87 - 5.11 MIL/uL    Hemoglobin 8.1 (L) 12.0 - 15.0 g/dL    HCT 30.3 (L) 36.0 - 46.0 %    MCV 85.8 78.0 - 100.0 fL    MCH 22.9 (L) 26.0 - 34.0 pg    MCHC 26.7 (L) 30.0 - 36.0 g/dL    RDW 23.9 (H) 11.5 - 15.5 %    Platelets 512 (H) 150 - 400 K/uL      Comment: Performed at [facility], [address] [place], [place] [address]  Basic metabolic panel     Status: Abnormal    Collection Time: 08/06/17  2:38 AM  Result Value Ref Range    Sodium 139 135 - 145 mmol/L    Potassium 4.3 3.5 - 5.1 mmol/L    Chloride 101 101 - 111 mmol/L    CO2 30 22 - 32 mmol/L    Glucose, Bld 96 65 - 99 mg/dL    BUN 6 6 - 20 mg/dL    Creatinine, Ser 0.47 0.44 - 1.00 mg/dL    Calcium 8.4 (L) 8.9 - 10.3 mg/dL    GFR calc non Af Amer >60 >60 mL/min    GFR calc Af Amer >60 >60 mL/min      Comment: (NOTE) The eGFR has been calculated using the CKD EPI equation. This calculation has not been validated in all clinical  situations. eGFR's persistently <60 mL/min signify possible Chronic  Kidney Disease.      Anion gap 8 5 - 15      Comment: Performed at [facility] [address], [place], [place] [address]  D-dimer, quantitative (not at [facility])     Status: Abnormal    Collection Time: 08/06/17  4:29 PM  Result Value Ref Range    D-Dimer, Quant 4.99 (H) 0.00 - 0.50 ug/mL-FEU      Comment: (NOTE) At the manufacturer cut-off of 0.50 ug/mL FEU, this assay has been documented to exclude PE with a sensitivity and negative predictive value of 97 to 99%.  At this time, this assay has not been approved by the FDA to exclude DVT/VTE. Results should be correlated with clinical presentation. Performed at [facility], [place] [address], [place], [place] [address]    CBC     Status: Abnormal    Collection Time: 08/07/17  2:11 AM  Result Value Ref Range    WBC 7.3 4.0 - 10.5 K/uL    RBC 3.71 (L) 3.87 - 5.11 MIL/uL    Hemoglobin 8.2 (L) 12.0 - 15.0 g/dL    HCT 32.0 (L) 36.0 - 46.0 %    MCV 86.3 78.0 - 100.0 fL    MCH 22.1 (L) 26.0 - 34.0 pg    MCHC 25.6 (L) 30.0 - 36.0 g/dL    RDW 24.6 (H) 11.5 - 15.5 %    Platelets 591 (H) 150 - 400 K/uL      Comment: Performed at [facility], [place] [address] [place], [place] [address]  Basic metabolic panel     Status: Abnormal    Collection Time: 08/07/17  2:11 AM  Result Value Ref Range    Sodium 141 135 - 145 mmol/L    Potassium 4.1 3.5 - 5.1 mmol/L    Chloride 99 (L) 101 - 111 mmol/L    CO2 34 (H) 22 - 32 mmol/L    Glucose, Bld 102 (H) 65 - 99 mg/dL    BUN 5 (L) 6 - 20 mg/dL    Creatinine, Ser 0.45 0.44 - 1.00 mg/dL    Calcium 8.4 (L) 8.9 - 10.3 mg/dL    GFR calc non Af Amer >60 >60 mL/min    GFR calc Af Amer >60 >60 mL/min      Comment: (NOTE) The eGFR has been calculated using the CKD EPI equation. This calculation has not been validated in all clinical situations. eGFR's persistently <60 mL/min signify possible Chronic Kidney Disease.       Anion gap 8 5 - 15      Comment: Performed at [facility] [address] [place], [place] [address]       Imaging Results (Last 48 hours)  Ct Angio Chest Pe W Or Wo Contrast   Result Date: 08/07/2017 CLINICAL DATA:  Right-sided chest pain EXAM: CT ANGIOGRAPHY CHEST WITH CONTRAST TECHNIQUE: Multidetector CT imaging of the chest was performed using the standard protocol during bolus administration of intravenous contrast. Multiplanar CT image reconstructions and MIPs were obtained to evaluate the vascular anatomy. CONTRAST:  85 mL Isovue 370. COMPARISON:  08/03/2017 FINDINGS: Cardiovascular: No significant atherosclerotic changes identified. No aneurysmal dilatation of the aorta is seen. The heart is enlarged in size similar to that seen on prior plain film examination. The pulmonary artery shows a normal branching pattern without focal intraluminal filling defect to suggest pulmonary embolism. Main pulmonary artery measures 4 cm in transverse dimension suggestive of pulmonary hypertension. Mediastinum/Nodes:  The thoracic inlet is within normal limits. Subcarinal adenopathy is identified which measures 2.4 cm in short axis. Scattered small hilar nodes are noted bilaterally. No other significant lymphadenopathy is seen. Lungs/Pleura: Lungs are well aerated bilaterally. No sizable effusion is noted. Mild left lower lobe atelectatic changes are seen. A mild mosaic attenuation pattern is noted throughout both lungs with mild emphysematous changes identified. This may be related to the underlying enlargement of the pulmonary artery. Upper Abdomen: Visualized upper abdomen is within normal limits. Musculoskeletal: Degenerative changes of the thoracic spine are noted. Review of the MIP images confirms the above findings. IMPRESSION: Changes most consistent with pulmonary hypertension. No evidence of pulmonary emboli. Mosaic attenuation pattern within the lungs bilaterally consistent with the changes of  pulmonary hypertension. Hilar and mediastinal lymph nodes of uncertain significance. Possibility of prior granulomatous disease deserves consideration (sarcoid) Electronically Signed   By: Inez Catalina M.D.   On: 08/07/2017 11:45             Medical Problem List and Plan: 1.  Debility secondary to acute on chronic disystolic heart failure with underlying chronic respiratory failure 2.  DVT Prophylaxis/Anticoagulation: Lovenox.  Monitor for any bleeding episodes 3. Pain Management: Voltaren gel 3 times daily, Mobic 15 mg daily, hydrocodone as needed 4. Mood: Provide emotional support 5. Neuropsych: This patient is capable of making decisions on her own behalf. 6. Skin/Wound Care: Routine skin checks 7. Fluids/Electrolytes/Nutrition: Routine INO's with follow-up chemistries 8. Acute on chronic diastolic CHF: Weight daily. Heart healthy diet.  Demadex 20 as directed.  Monitor for any signs of fluid overload 9. Severe pulmonary HTN: with hypoxia: Monitor with increased mobility 10 LLL CAP: Complete course of Omnicef.   11. Anemia of chronic disease with B12 and iron deficiency:  Has been non-complianat with iron/B 12 injections. Monitor with serial checks. Educate on importance of medical compliance. Continue iron supplement  TID with B12 injections. 12. Severe right knee OA: Continue Mobic ? as well as local measures with ice.  13. Severe obesity: BMI 77.6.  Follow-up dietary 14.  Constipation.  Laxative assistance       Post Admission Physician Evaluation: 1. Functional deficits secondary  to acute on chronic disystolic heart failure. 2. Patient is admitted to receive collaborative, interdisciplinary care between the physiatrist, rehab nursing staff, and therapy team. 3. Patient's level of medical complexity and substantial therapy needs in context of that medical necessity cannot be provided at a lesser intensity of care such as a SNF. 4. Patient has experienced substantial functional loss  from his/her baseline which was documented above under the "Functional History" and "Functional Status" headings.  Judging by the patient's diagnosis, physical exam, and functional history, the patient has potential for functional progress which will result in measurable gains while on inpatient rehab.  These gains will be of substantial and practical use upon discharge  in facilitating mobility and self-care at the household level. 5. Physiatrist will provide 24 hour management of medical needs as well as oversight of the therapy plan/treatment and provide guidance as appropriate regarding the interaction of the two. 6. The Preadmission Screening has been reviewed and patient status is unchanged unless otherwise stated above. 7. 24 hour rehab nursing will assist with bladder management, bowel management, safety, skin/wound care, disease management, medication administration, pain management and patient education  and help integrate therapy concepts, techniques,education, etc. 8. PT will assess and treat for/with: pre gait, gait training, endurance , safety, equipment, neuromuscular re education.   Goals  are: Mod I. 9. OT will assess and treat for/with: ADLs, Cognitive perceptual skills, Neuromuscular re education, safety, endurance, equipment.   Goals are: Mod I. Therapy may proceed with showering this patient. 10. SLP will assess and treat for/with: NA.  Goals are: NA. 11. Case Management and Social Worker will assess and treat for psychological issues and discharge planning. 12. Team conference will be held weekly to assess progress toward goals and to determine barriers to discharge. 13. Patient will receive at least 3 hours of therapy per day at least 5 days per week. 14. ELOS: 10-15d       15. Prognosis:  good         "I have personally performed a face to face diagnostic evaluation of this patient.  Additionally, I have reviewed and concur with the physician assistant's documentation  above." Charlett Blake M.D. [facility] FAAPM&R (Sports Med, Neuromuscular Med) Diplomate [employer] Board of [place], PA-C 08/07/2017

## 2017-08-09 NOTE — PMR Pre-admission (Signed)
PMR Admission Coordinator Pre-Admission Assessment  Patient: Dana Abbott is an 52 y.o., female MRN: [id number] DOB: [birth year] Height: 4\' 9"  (144.8 cm) Weight: (!) 159 kg (350 lb 8.5 oz)              Insurance Information HMO:     PPO:      PCP:      IPA:      80/20:      OTHER:  PRIMARY: Medicaid [place] Access      Policy#: [id number]      Subscriber: Self CM Name:       Phone#:      Fax#:  Pre-Cert#: Coverage code: [id number]      Employer:  Benefits:  Phone #: [phone]     Name: Verified via an automated system  Eff. Date: Eligible as of 08/03/17     Deduct:       Out of Pocket Max:       Life Max:  CIR: Covered per Medicaid guidelines         SECONDARY: None        Emergency Contact Information Contact Information    Name Relation Home Work Mobile   [place] Mother   [phone]   Lyna Poser Sister   [phone]     Current Medical History  Patient Admitting Diagnosis: Debility  History of Present Illness: Dana Abbott is a 52 year old female with history of OSA with home oxygen 1.5 L as needed, CAD, CHF (prior CIR stay for debility), gastric bypass with chronic irons deficiency anemia, morbid obesity, who was admitted on 08/03/17 with reports of fall onto right chest and knee a week PTA with progressive weakness, increase in BLE edema, right knee pain, and SOB.  Work up revealed acute on chronic diastolic CHF with CAP LLL and anemia with Hgb 8.3. She was treated with IV diuresis and started on Rocephin and Zithromax. She received one unit PRBC for anemia and was started on B12 supplementation. She has had severe pain right knee and X rays revealed severe OA.  Dr. Charlann Boxer consulted for input and knee injected on 4/12 with DepoMedrol. Ortho recommended weight loss  (needs BMI < 40 for surgical intervention), local measures as well as resumption of Mobic.  She continues to have hypoxia with activity and 2D echo done revealing EF 65-70% with no wall abnormality, left  ventricular septum with diastolic/systolic flattening and increased right ventricular pressures consistent with severe pulmonary HTN. Cardiology consulted for input with cardiac catheterization 08/08/2017 with normal coronary arteries.  Patient remains on Demadex. CTA lungs done to rule out PE and showed changes consistent with pulmonary HTN and hilar and mediastinal lymph nodes of uncertain significance (question prior granulomatous disease).  Subcutaneous Lovenox for DVT prophylaxis.  PT and OT evaluations completed with recommendations of physical medicine rehab consult.  Patient was admitted for a comprehensive rehab program 08/09/17.       Past Medical History  Past Medical History:  Diagnosis Date  . Anemia    Reportedly from heavy menses and pernicious anemia  . Arthritis    "knees" (07/31/2015)  . CHF (congestive heart failure) (HCC) ?2003  . DVT (deep venous thrombosis) (HCC) "after my heart attack"   "one of my legs"  . Heavy menses   . History of blood transfusion "I've had alot"   "I'm anemic"  . Myocardial infarction [facility]) 2003?  . OSA (obstructive sleep apnea) 2003  . Stroke [facility]) ?2003    Family History  family history includes COPD in her father; Diabetes type II in her daughter; Hypertension in her mother; Stroke in her mother.  Prior Rehab/Hospitalizations:  Has the patient had major surgery during 100 days prior to admission? No  Current Medications   Current Facility-Administered Medications:  .  0.9 %  sodium chloride infusion, 250 mL, Intravenous, PRN, Bensimhon, Bevelyn Buckles, MD .  acetaminophen (TYLENOL) tablet 650 mg, 650 mg, Oral, Q4H PRN, Bensimhon, Bevelyn Buckles, MD .  albuterol (PROVENTIL) (2.5 MG/3ML) 0.083% nebulizer solution 2.5 mg, 2.5 mg, Nebulization, Q2H PRN, Bensimhon, Bevelyn Buckles, MD .  cefdinir (OMNICEF) capsule 300 mg, 300 mg, Oral, Q12H, Rai, Ripudeep K, MD .  cyanocobalamin ((VITAMIN B-12)) injection 1,000 mcg, 1,000 mcg, Intramuscular, Daily, Bensimhon,  Bevelyn Buckles, MD, 1,000 mcg at 08/09/17 1119 .  diclofenac sodium (VOLTAREN) 1 % transdermal gel 2 g, 2 g, Topical, TID, Bensimhon, Bevelyn Buckles, MD, 2 g at 08/09/17 1101 .  enoxaparin (LOVENOX) injection 40 mg, 40 mg, Subcutaneous, Q24H, Bensimhon, Bevelyn Buckles, MD, 40 mg at 08/09/17 0900 .  ferrous sulfate tablet 325 mg, 325 mg, Oral, TID WC, Bensimhon, Bevelyn Buckles, MD, 325 mg at 08/09/17 0846 .  guaiFENesin (MUCINEX) 12 hr tablet 600 mg, 600 mg, Oral, BID, Bensimhon, Bevelyn Buckles, MD, 600 mg at 08/09/17 1059 .  HYDROcodone-acetaminophen (NORCO/VICODIN) 5-325 MG per tablet 1-2 tablet, 1-2 tablet, Oral, Q6H PRN, Bensimhon, Bevelyn Buckles, MD, 2 tablet at 08/08/17 1859 .  lidocaine (XYLOCAINE) 1 % (with pres) injection 10 mL, 10 mL, Other, Once, Bensimhon, Bevelyn Buckles, MD .  meloxicam (MOBIC) tablet 15 mg, 15 mg, Oral, Daily, Bensimhon, Bevelyn Buckles, MD, 15 mg at 08/09/17 1058 .  methylPREDNISolone acetate (DEPO-MEDROL) injection 80 mg, 80 mg, Intra-articular, Once, Bensimhon, Bevelyn Buckles, MD .  ondansetron [facility]) injection 4 mg, 4 mg, Intravenous, Q6H PRN, Bensimhon, Bevelyn Buckles, MD .  potassium chloride SA (K-DUR,KLOR-CON) CR tablet 20 mEq, 20 mEq, Oral, Daily, Bensimhon, Bevelyn Buckles, MD, 20 mEq at 08/09/17 1059 .  sodium chloride flush (NS) 0.9 % injection 3 mL, 3 mL, Intravenous, Q12H, Bensimhon, Bevelyn Buckles, MD, 3 mL at 08/08/17 2336 .  sodium chloride flush (NS) 0.9 % injection 3 mL, 3 mL, Intravenous, Q12H, Bensimhon, Daniel R, MD .  sodium chloride flush (NS) 0.9 % injection 3 mL, 3 mL, Intravenous, PRN, Bensimhon, Bevelyn Buckles, MD .  torsemide (DEMADEX) tablet 20 mg, 20 mg, Oral, Daily, Graciella Freer, PA-C .  torsemide [facility]) tablet 40 mg, 40 mg, Oral, Daily, Graciella Freer, PA-C, 40 mg at 08/09/17 1118  Patients Current Diet: Diet renal/carb modified with fluid restriction Diet-HS Snack? Nothing; Fluid restriction: 1200 mL Fluid; Room service appropriate? Yes; Fluid consistency: Thin  Precautions /  Restrictions Precautions Precautions: Fall Precaution Comments: monitor O2 sats, desatted on Room Air Restrictions Weight Bearing Restrictions: Yes   Has the patient had 2 or more falls or a fall with injury in the past year?No, 1 fall without injury just a couple days PTA.  Prior Activity Level Community (5-7x/wk): Prior to admission patient was fully independent and active.  She drove, was out in the community daily, and helped care for her grandson.   Home Assistive Devices / Equipment Home Assistive Devices/Equipment: Oxygen, Environmental consultant (specify type), Eyeglasses Home Equipment: Grab bars - tub/shower, Cane - single point, Walker - 4 wheels  Prior Device Use: Indicate devices/aids used by the patient prior to current illness, exacerbation or injury? Walker, Rollator   Prior Functional Level Prior Function Level of  Independence: Independent, Independent with assistive device(s) Comments: Rolling walker for amb; drives, cares fo rgrandson  Self Care: Did the patient need help bathing, dressing, using the toilet or eating? Independent  Indoor Mobility: Did the patient need assistance with walking from room to room (with or without device)? Independent  Stairs: Did the patient need assistance with internal or external stairs (with or without device)? Independent  Functional Cognition: Did the patient need help planning regular tasks such as shopping or remembering to take medications? Independent  Current Functional Level Cognition  Overall Cognitive Status: Within Functional Limits for tasks assessed Orientation Level: Oriented X4    Extremity Assessment (includes Sensation/Coordination)  Upper Extremity Assessment: Overall WFL for tasks assessed  Lower Extremity Assessment: Defer to PT evaluation(R knee pain)    ADLs  Overall ADL's : Needs assistance/impaired Grooming: Set up, Sitting Upper Body Bathing: Set up, Sitting Lower Body Bathing: Moderate assistance, Bed  level Upper Body Dressing : Set up, Sitting Lower Body Dressing: Moderate assistance, Bed level Lower Body Dressing Details (indicate cue type and reason): rolling side/side Toilet Transfer: +2 for physical assistance, Moderate assistance Functional mobility during ADLs: Moderate assistance, +2 for physical assistance General ADL Comments: Pt typically leans over to donn/doff socks; may benefi tform use of AE    Mobility  Overal bed mobility: Needs Assistance Bed Mobility: Supine to Sit, Sit to Supine Supine to sit: Min guard Sit to supine: Mod assist General bed mobility comments: Minguard for safety with pt coming to sit; reliant on bedrails and momentum for scoot to EOB; VERY inefficient reciprocal scoot backwards, shifts weight laterally, but decr anterior shift of upper trunk to take advantage of head-hips relationship; Mod assist to help LEs into bed    Transfers  Overall transfer level: Needs assistance Equipment used: Rolling walker (2 wheeled)(Bari RW on shrotest setting) Transfers: Sit to/from Stand Sit to Stand: +2 safety/equipment, Mod assist General transfer comment: Dependent on momentum, Painful R knee    Ambulation / Gait / Stairs / Wheelchair Mobility  Ambulation/Gait Ambulation/Gait assistance: Mod assist, +2 safety/equipment(Very close chair follow) Ambulation Distance (Feet): 15 Feet Assistive device: Rolling walker (2 wheeled) Gait Pattern/deviations: Decreased step length - right, Decreased step length - left, Shuffle General Gait Details: Unable today; Limited by R knee pain and to go for imaging     Posture / Balance Balance Overall balance assessment: History of Falls, Needs assistance Sitting balance-Leahy Scale: Good Standing balance-Leahy Scale: Poor Standing balance comment: dependent on support    Special needs/care consideration BiPAP/CPAP: Not PTA CPM: No Continuous Drip IV: No Dialysis: No         Life Vest: No Oxygen: Yes 1.5L PRN if below 90  SpO2 Special Bed: No Trach Size: No Wound Vac (area): No       Skin: WDL                               Bowel mgmt: None documented since admission  Bladder mgmt: External catheter  Diabetic mgmt: No     Previous Home Environment Living Arrangements: Alone Available Help at Discharge: Family, Available PRN/intermittently(Daughter lives near; pt helps give care for 107 yo grandson) Type of Home: Apartment Home Layout: One level Home Access: Level entry Bathroom Shower/Tub: Engineer, manufacturing systems: Standard Bathroom Accessibility: Yes How Accessible: Accessible via walker(not wc accessible) Home Care Services: No  Discharge Living Setting Plans for Discharge Living Setting: Patient's home, Apartment, Alone  Type of Home at Discharge: Apartment Discharge Home Layout: One level Discharge Home Access: Level entry, Other (comment)(but 15 steps to enter daughter's apt. with both hand rails) Discharge Bathroom Shower/Tub: Tub/shower unit, Other (comment)(has a bench ) Discharge Bathroom Toilet: Handicapped height Discharge Bathroom Accessibility: Yes How Accessible: Accessible via walker(but did not need to use in bathroom PTA) Does the patient have any problems obtaining your medications?: No  Social/Family/Support Systems Patient Roles: Parent, Other (Comment)(Daughter, Gandmother ) Contact Information: Patient to arrage discharge support from either sister or daughter  Anticipated Caregiver: sister or daughter  Anticipated Caregiver's Contact Information: see above  Ability/Limitations of Caregiver: daughter unable to drive per pt. report  Caregiver Availability: 24/7 Discharge Plan Discussed with Primary Caregiver: (discussed with patient ) Is Caregiver In Agreement with Plan?: (patient in agreement with plan ) Does Caregiver/Family have Issues with Lodging/Transportation while Pt is in Rehab?: (Daughter cannot drive per pt report and sister lives 3  hours)  Goals/Additional Needs Patient/Family Goal for Rehab: PT/OT: Supervision  Expected length of stay: 10-15 days  Cultural Considerations: None Dietary Needs: Heart Healthy diet restrictions Equipment Needs: TBD Special Service Needs: Needs to be set up with out patient sleep study after discharge  Additional Information: Has home O2, used 1.5L PRN if she dropped below 90 SpO2 Pt/Family Agrees to Admission and willing to participate: Yes Program Orientation Provided & Reviewed with Pt/Caregiver Including Roles  & Responsibilities: Yes  Decrease burden of Care through IP rehab admission: No  Possible need for SNF placement upon discharge: No  Patient Condition: This patient's medical and functional status has changed since the consult dated: 08/06/17 in which the Rehabilitation Physician determined and documented that the patient's condition is appropriate for intensive rehabilitative care in an inpatient rehabilitation facility. See "History of Present Illness" (above) for medical update. Functional changes are: Mod A +2 transfers. Patient's medical and functional status update has been discussed with the Rehabilitation physician and patient remains appropriate for inpatient rehabilitation. Will admit to inpatient rehab today.  Preadmission Screen Completed By:  Fae Pippin, 08/09/2017 1:14 PM ______________________________________________________________________   Discussed status with Dr. Wynn Banker on 08/09/17 at 1330  and received telephone approval for admission today.  Admission Coordinator:  Fae Pippin, time 1330/Date 08/09/17

## 2017-08-09 NOTE — Progress Notes (Signed)
Physical Medicine and Rehabilitation Consult Reason for Consult: Decreased functional mobility Referring Physician: [facility]   HPI: Dana Abbott is a 52 y.o. right-handed female with history of obesity and gastric bypass surgery, diastolic congestive heart failure, CAD/MI, chronic anemia.  Per chart review and patient, patient lives alone.  Independent with assistive device.  Driving short distances.  She does take care of her grandson.  One level apartment.  History taken from chart review and patient. Presented 08/03/2017 with shortness of breath times 1 week as well as nonspecific squeezing of the chest complicated by recent fall landing on the right knee.  Chest x-ray showed new patchy opacities left lower lobe, atelectasis versus infiltrate.  Stable cardiomegaly with worsening perihilar edema.  X-rays of the right knee showed severe osteoarthritis particularly in the medial knee compartment with probable joint effusion no acute bone abnormality.  Troponin negative.  Noted hemoglobin 7.3 felt to be chronic and she did receive 1 unit packed red blood cells..  Placed on intravenous Rocephin as well as Zithromax.  Echocardiogram is pending.  Orthopedic service follow-up for severe osteoarthritis of the right knee receive cortisone injection 08/06/2017.   Review of Systems  Constitutional: Positive for malaise/fatigue. Negative for chills and fever.  HENT: Negative for hearing loss.   Eyes: Negative for blurred vision and double vision.  Respiratory: Positive for shortness of breath.   Cardiovascular: Negative for palpitations.  Gastrointestinal: Positive for constipation. Negative for nausea and vomiting.  Genitourinary: Negative for dysuria, flank pain and hematuria.  Musculoskeletal: Positive for joint pain and myalgias.  Skin: Negative for rash.  Neurological: Negative for seizures.  All other systems reviewed and are negative.      Past Medical History:  Diagnosis Date  . Anemia     Reportedly from heavy menses and pernicious anemia  . Arthritis    "knees" (07/31/2015)  . CHF (congestive heart failure) (HCC) ?2003  . DVT (deep venous thrombosis) (HCC) "after my heart attack"   "one of my legs"  . Heavy menses   . History of blood transfusion "I've had alot"   "I'm anemic"  . Myocardial infarction [facility]) 2003?  . OSA (obstructive sleep apnea) 2003  . Stroke [facility] ?2003        Past Surgical History:  Procedure Laterality Date  . CARPAL TUNNEL RELEASE Right   . CESAREAN SECTION  1985; 1991; 1993  . CHOLECYSTECTOMY  2004   w/gastric OR  . GASTRIC BYPASS  2004  . TONSILLECTOMY          Family History  Problem Relation Age of Onset  . Stroke Mother   . Hypertension Mother   . COPD Father   . Diabetes type II Daughter   . Cancer Neg Hx    Social History:  reports that she quit smoking about 26 years ago. Her smoking use included cigarettes. She started smoking about 29 years ago. She has a 2.00 pack-year smoking history. She has never used smokeless tobacco. She reports that she does not drink alcohol or use drugs. Allergies:       Allergies  Allergen Reactions  . Shellfish Allergy     Rash          Medications Prior to Admission  Medication Sig Dispense Refill  . acetaminophen (TYLENOL) 325 MG tablet Take 2 tablets (650 mg total) by mouth every 6 (six) hours as needed for mild pain. (Patient taking differently: Take 975 mg by mouth every 6 (six) hours as needed for mild pain (  Taking 3 tabs every 6 hours.). )    . ferrous sulfate 325 (65 FE) MG tablet Take 1 tablet (325 mg total) by mouth 3 (three) times daily with meals. (Patient taking differently: Take 325 mg by mouth 2 (two) times daily with a meal. ) 90 tablet 0  . meloxicam (MOBIC) 15 MG tablet Take 1 tablet (15 mg total) by mouth daily. 30 tablet 4  . [DISCONTINUED] furosemide (LASIX) 40 MG tablet Take 1 tablet (40 mg total) by mouth 2 (two) times daily. 60 tablet 0  .  [DISCONTINUED] Cholecalciferol 10000 units TABS Take 10,000 Units by mouth daily. (Patient not taking: Reported on 08/03/2017) 60 tablet 0  . [DISCONTINUED] potassium chloride SA (K-DUR,KLOR-CON) 20 MEQ tablet Take 1 tablet (20 mEq total) by mouth daily. 30 tablet 0  . [DISCONTINUED] vitamin B-12 (CYANOCOBALAMIN) 100 MCG tablet Take 1 tablet (100 mcg total) by mouth 2 (two) times daily. (Patient not taking: Reported on 11/02/2015) 60 tablet 0    Home: Home Living Family/patient expects to be discharged to:: Private residence Living Arrangements: Alone Available Help at Discharge: Family, Available PRN/intermittently(Daughter lives near; pt helps give care for 86 yo grandson) Type of Home: Apartment Home Access: Level entry Home Layout: One level  Functional History: Prior Function Level of Independence: Independent, Independent with assistive device(s) Comments: Rolling walker for amb; drives, cares fo rgrandson Functional Status:  Mobility: Transfers Overall transfer level: Needs assistance Equipment used: Rolling walker (2 wheeled) Transfers: Sit to/from Stand Sit to Stand: +2 safety/equipment, Mod assist General transfer comment: Dependent on momentum, with significant trunk flexion to get weight over feet; initial stand unable to get knees fully extended and had to sit back to bed; bil support; Mod assist to steady and power up Ambulation/Gait Ambulation/Gait assistance: Mod assist, +2 safety/equipment(Very close chair follow) Ambulation Distance (Feet): 15 Feet Assistive device: Rolling walker (2 wheeled) Gait Pattern/deviations: Decreased step length - right, Decreased step length - left, Shuffle General Gait Details: very close chair follow for safety; very motivated to get to bathroom; at one point, difficulty stepping LLE while turning to sit on commode, requiring mod assist  ADL:  Cognition: Cognition Overall Cognitive Status: Within Functional Limits for tasks  assessed Orientation Level: Oriented X4 Cognition Arousal/Alertness: Awake/alert Behavior During Therapy: WFL for tasks assessed/performed Overall Cognitive Status: Within Functional Limits for tasks assessed  Blood pressure (!) 92/51, pulse 80, temperature 98 F (36.7 C), temperature source Oral, resp. rate 19, height 4\' 9"  (1.448 m), weight (!) 162.7 kg (358 lb 9.6 oz), SpO2 91 %. Physical Exam  Vitals reviewed. Constitutional: She is oriented to person, place, and time. She appears well-developed.  52 year old morbidly obese female  HENT:  Head: Normocephalic and atraumatic.  Eyes: EOM are normal. Right eye exhibits no discharge. Left eye exhibits no discharge.  Neck: Normal range of motion. Neck supple. No thyromegaly present.  Cardiovascular: Normal rate, regular rhythm and normal heart sounds.  Respiratory:  Fair inspiratory effort.  Clear to auscultation +Hallock  GI: Soft. Bowel sounds are normal. She exhibits no distension.  Musculoskeletal:  Lymphedema LE  Neurological: She is alert and oriented to person, place, and time.  Motor: B/l UE 4/5 proximal to distal B/l LE: HF, KE 2+/5, ADF 4-/5 (right weaker than left - pain inhibition) Sensation intact to light touch  Skin: Skin is warm and dry.  Psychiatric: She has a normal mood and affect. Her behavior is normal.   Assessment/Plan: Diagnosis: Debility Labs independently reviewed.  Records reviewed  and summated above.  1. Does the need for close, 24 hr/day medical supervision in concert with the patient's rehab needs make it unreasonable for this patient to be served in a less intensive setting? Potentially  2. Co-Morbidities requiring supervision/potential complications: acute on chronic blood loss anemia (transfuse if necessary to ensure appropriate perfusion for increased activity tolerance), osteoarthritis right knee (s/p steroid injection), super super obesity (Body mass index is 77.6 kg/m.), diet and exercise  education, encourage weight loss to increase endurance and promote overall health, gastric bypass surgery, diastolic congestive heart failure (monitor for signs/symptoms of fluid overload), CAD/MI (cont meds) 3. Due to safety, disease management and patient education, does the patient require 24 hr/day rehab nursing? Potentially 4. Does the patient require coordinated care of a physician, rehab nurse, PT (1-2 hrs/day, 5 days/week) and OT (1-2 hrs/day, 5 days/week) to address physical and functional deficits in the context of the above medical diagnosis(es)? Potentially Addressing deficits in the following areas: balance, endurance, locomotion, strength, transferring, bathing, dressing, toileting and psychosocial support 5. Can the patient actively participate in an intensive therapy program of at least 3 hrs of therapy per day at least 5 days per week? Yes 6. The potential for patient to make measurable gains while on inpatient rehab is excellent 7. Anticipated functional outcomes upon discharge from inpatient rehab are supervision  with PT, supervision with OT, n/a with SLP. 8. Estimated rehab length of stay to reach the above functional goals is: 10-15 days. 9. Anticipated D/C setting: Home 10. Anticipated post D/C treatments: HH therapy and Home excercise program 11. Overall Rehab/Functional Prognosis: good  RECOMMENDATIONS: This patient's condition is appropriate for continued rehabilitative care in the following setting: Recommend CIR, however, patient is unsure if she would like to stay in the hospital for CIR.  She states she would like to "evaluate my options" after reeval by therapies.  She believes she did not do as well as she could have because she was in bed for so many days. Patient has agreed to participate in recommended program. Potentially Note that insurance prior authorization may be required for reimbursement for recommended care.  Comment: Rehab Admissions Coordinator to  follow up.  Maryla Morrow, MD, ABPMR Mcarthur Rossetti Angiulli, PA-C 08/06/2017      Revision History                             Routing History

## 2017-08-09 NOTE — Discharge Summary (Signed)
Physician Discharge Summary   Patient ID: Dana Abbott MRN: [id number] DOB/AGE: [birth year] 50 y.o.  Admit date: 08/03/2017 Discharge date: 08/09/2017  Primary Care Physician:  [name], MD   Recommendations for Outpatient Follow-up:  1. Follow up with PCP in 1-2 weeks 2. Please obtain BMP/CBC in one week 3. Please follow up on the following pending results:  Home Health: Patient being discharged to inpatient rehab   Discharge Condition: stable  CODE STATUS: FULL  Diet recommendation: Heart healthy diet   Discharge Diagnoses:   Community-acquired pneumonia, left lung . Acute on chronic symptomatic anemia Obesity hypoventilation Severe pulmonary hypertension Acute on chronic respiratory failure with hypoxia Iron deficiency anemia with history of gastric bypass B12 deficiency with history of gastric bypass Acute on chronic diastolic CHF Severe osteoarthritis with right knee pain Morbid obesity  Consults: Cardiology    Allergies:   Allergies  Allergen Reactions  . Shellfish Allergy     Rash      DISCHARGE MEDICATIONS: Allergies as of 08/09/2017      Reactions   Shellfish Allergy    Rash       Medication List    TAKE these medications   acetaminophen 325 MG tablet Commonly known as:  TYLENOL Take 2 tablets (650 mg total) by mouth every 6 (six) hours as needed for mild pain. What changed:    how much to take  reasons to take this   azithromycin 500 MG tablet Commonly known as:  ZITHROMAX Take 1 tablet (500 mg total) by mouth daily for 2 days.   cefdinir 300 MG capsule Commonly known as:  OMNICEF Take 1 capsule (300 mg total) by mouth every 12 (twelve) hours for 2 days.   Cholecalciferol 10000 units Tabs Take 10,000 Units by mouth daily.   diclofenac sodium 1 % Gel Commonly known as:  VOLTAREN Apply 2 g topically 3 (three) times daily. Apply to Right knee   ferrous sulfate 325 (65 FE) MG tablet Take 1 tablet (325 mg total) by  mouth 3 (three) times daily with meals. What changed:  when to take this   furosemide 40 MG tablet Commonly known as:  LASIX Take 1 tablet (40 mg total) by mouth 2 (two) times daily.   meloxicam 15 MG tablet Commonly known as:  MOBIC Take 1 tablet (15 mg total) by mouth daily.   potassium chloride SA 20 MEQ tablet Commonly known as:  K-DUR,KLOR-CON Take 1 tablet (20 mEq total) by mouth daily.   torsemide 20 MG tablet Commonly known as:  DEMADEX Take 2 tabs (40 mg) in AM and 1 tab (20mg ) in PM   traMADol 50 MG tablet Commonly known as:  ULTRAM Take 1 tablet (50 mg total) by mouth every 6 (six) hours as needed for moderate pain or severe pain.   vitamin B-12 1000 MCG tablet Commonly known as:  CYANOCOBALAMIN Take 1 tablet (1,000 mcg total) by mouth daily. What changed:    medication strength  how much to take  when to take this            Durable Medical Equipment  (From admission, onward)        Start     Ordered   08/06/17 1344  For home use only DME oxygen  Once    Comments:  Please evaluate for light weight portable oxygen (POC) simplygo  Question Answer Comment  Mode or (Route) Nasal cannula   Liters per Minute 2   Frequency Continuous (stationary and portable oxygen  unit needed)   Oxygen delivery system Gas      08/06/17 1344       Brief H and P: For complete details please refer to admission H and P, but in brief 52 y.o.femalepast history significant for CHF, CAD presents with shortness of breath. Symptoms started 1 week ago he has bilateral aching squeezing the chest. Worse with exertion, no chest pain. Has been coughing. No melena on DREand Guiac neg perEDP.  She also reported that she has a history of chronic iron deficiency anemia/pernicious anemia and heavy menstrual bleed for many years, from the multiple abdominal surgeries she had including the gastric bypass   Hospital Course:  Acute on chronic symptomatic anemia, iron deficiency  and B12 deficiency -Patient has a history of gastric bypass surgery and states she has not been taking iron supplementation and B12 supplementation consistently -Hemoglobin 7.1 at the time of admission, received 1 unit packed RBC transfusion. B12 130 low, iron profile consistent with iron deficiency - Continue IM B12 supplementation x 2 more doses to complete 7 days and then place on 1000 mcg oral daily daily - IV Feraheme x1 given on 4/14, continue oral supplementation    Acute on chronic hypoxic respiratory failure sec to Community acquired pneumonia of left lower lobe of lung (HCC), acute on chronic diastolic CHF, severe pulmonary hypertension, obesity hypoventilation -Patient transitioned to oral Zithromax and cefdinir for 2 more days to complete full course of antibiotics for 7 days for CAP PNA.    Acute on chronic diastolic CHF, underlying chronic respiratory failure -Previous echo in 2017 showed normal EF 55%, complaining of shortness of breath, exertional -BNP elevated 264.8, patient was placed on IV Lasix, increase to 40 mg IV every 12 hours  - Echo showed EF 65-70% but severe RV failure, TR, PA pressure 26mm Hg -CT angiogram negative for pulmonary embolism -Patient underwent cardiac cath on 4/17, normal coronaries.  Cardiology felt severe pulmonary hypertension likely due to OSA or obesity hypoventilation.  She had some hypercarbia on ABG and hypoxia on ambulation -Recommended outpatient sleep study, PFTs, lose weight, diet and exercise.  Repeat 2D echo in 3 months. -Patient placed on torsemide 40 mg a.m., 20 mg p.m. IV diuretics stopped.   Severe osteoarthritis and right knee pain -Per patient she fell on her right side prior to admission, right knee x-ray showed severe osteoarthritis in the right knee, particularly in the medial knee compartment, probable joint effusion -Negative FOBT, restarted meloxicam -Orthopedics consulted, status post ESI injection for pain on 4/15.   Unfortunately cannot do anything for severe osteoarthritis/knee replacement until patient has some weight loss. -PT evaluation recommended CIR, interested in CIR  Morbid obesity - BMI 77.5, counseled strongly on diet and weight control.  Discussed about diet choices with nutrition, exercise and weight loss.     Day of Discharge S: No complaints, eager to start rehab  BP (!) 97/48 (BP Location: Left Arm)   Pulse 81   Temp 97.7 F (36.5 C)   Resp 20   Ht 4\' 9"  (1.448 m)   Wt (!) 159 kg (350 lb 8.5 oz)   SpO2 98%   BMI 75.85 kg/m   Physical Exam: General: Alert and awake oriented x3 not in any acute distress. HEENT: anicteric sclera, pupils reactive to light and accommodation CVS: S1-S2 clear no murmur rubs or gallops Chest: clear to auscultation bilaterally, no wheezing rales or rhonchi Abdomen: Morbidly obese, soft nontender, nondistended, normal bowel sounds Extremities: no cyanosis, clubbing or  edema noted bilaterally Neuro: Cranial nerves II-XII intact, no focal neurological deficits   The results of significant diagnostics from this hospitalization (including imaging, microbiology, ancillary and laboratory) are listed below for reference.      Procedures/Studies:  Cardiac cath Findings:  Ao =118/78 (96) LV =119/24 RA = 16 RV = 73/19 PA = 80/31 (51) PCW = 19 Fick cardiac output/index = 7.8/3.37 PVR = 4.1 WU Ao sat = 93% PA sat = 55%, 60%  Assessment: 1. Normal coronary arteries 2. LVEF 60-65% 3. 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 Suspect Fick CO overestimated due to body habitus  Plan/Discussion:  She has moderate to severe PAH likely due to OHS. Volume status remains mildly elevated Will diurese overnight. Switch to po diuretics tomorrow. Needs weight loss and to keep sats >= 90%. Not candidate for selective pulmonary vasodilators.   Dg Chest 2 View  Result Date: 08/03/2017 CLINICAL DATA:  Chest pain. EXAM: CHEST - 2 VIEW COMPARISON:   Chest x-ray and CT chest dated June 02, 2016. FINDINGS: Stable cardiomegaly. Worsening perihilar interstitial opacities. New patchy opacities in the left lower lobe. No pleural effusion or pneumothorax. No acute osseous abnormality. IMPRESSION: 1. Stable cardiomegaly with worsening perihilar edema. 2. New patchy opacities in the left lower lobe, atelectasis versus infiltrate. Electronically Signed   By: Obie Dredge M.D.   On: 08/03/2017 19:28   Ct Angio Chest Pe W Or Wo Contrast  Result Date: 08/07/2017 CLINICAL DATA:  Right-sided chest pain EXAM: CT ANGIOGRAPHY CHEST WITH CONTRAST TECHNIQUE: Multidetector CT imaging of the chest was performed using the standard protocol during bolus administration of intravenous contrast. Multiplanar CT image reconstructions and MIPs were obtained to evaluate the vascular anatomy. CONTRAST:  85 mL Isovue 370. COMPARISON:  08/03/2017 FINDINGS: Cardiovascular: No significant atherosclerotic changes identified. No aneurysmal dilatation of the aorta is seen. The heart is enlarged in size similar to that seen on prior plain film examination. The pulmonary artery shows a normal branching pattern without focal intraluminal filling defect to suggest pulmonary embolism. Main pulmonary artery measures 4 cm in transverse dimension suggestive of pulmonary hypertension. Mediastinum/Nodes: The thoracic inlet is within normal limits. Subcarinal adenopathy is identified which measures 2.4 cm in short axis. Scattered small hilar nodes are noted bilaterally. No other significant lymphadenopathy is seen. Lungs/Pleura: Lungs are well aerated bilaterally. No sizable effusion is noted. Mild left lower lobe atelectatic changes are seen. A mild mosaic attenuation pattern is noted throughout both lungs with mild emphysematous changes identified. This may be related to the underlying enlargement of the pulmonary artery. Upper Abdomen: Visualized upper abdomen is within normal limits.  Musculoskeletal: Degenerative changes of the thoracic spine are noted. Review of the MIP images confirms the above findings. IMPRESSION: Changes most consistent with pulmonary hypertension. No evidence of pulmonary emboli. Mosaic attenuation pattern within the lungs bilaterally consistent with the changes of pulmonary hypertension. Hilar and mediastinal lymph nodes of uncertain significance. Possibility of prior granulomatous disease deserves consideration (sarcoid) Electronically Signed   By: Alcide Clever M.D.   On: 08/07/2017 11:45   Dg Knee 3 View Right  Result Date: 08/05/2017 CLINICAL DATA:  Recent fall with right knee pain. Pain is along the anterior surface. EXAM: RIGHT KNEE - 3 VIEW COMPARISON:  02/09/2016 FINDINGS: Again noted are severe degenerative changes involving the right knee. Severe medial compartment narrowing with flattening and remodeling of the medial femoral condyle. Mild lateral subluxation of the right knee associated with the degenerative disease. Diffuse osteophytosis  in the knee. Negative for an acute fracture or dislocation. Probable suprapatellar joint effusion. Bone detail is limited due to body habitus. IMPRESSION: Severe osteoarthritis in the right knee, particularly in the medial knee compartment. Probable joint effusion. No acute bone abnormality. Electronically Signed   By: Richarda Overlie M.D.   On: 08/05/2017 10:05      LAB RESULTS: Basic Metabolic Panel: Recent Labs  Lab 08/08/17 0239 08/09/17 0854  NA 142 138  K 4.2 4.2  CL 97* 97*  CO2 37* 30  GLUCOSE 102* 127*  BUN 6 5*  CREATININE 0.49 0.49  CALCIUM 8.6* 8.6*   Liver Function Tests: Recent Labs  Lab 08/03/17 1949  AST 19  ALT 12*  ALKPHOS 80  BILITOT 0.5  PROT 6.7  ALBUMIN 3.0*   No results for input(s): LIPASE, AMYLASE in the last 168 hours. No results for input(s): AMMONIA in the last 168 hours. CBC: Recent Labs  Lab 08/05/17 0145  08/08/17 0239 08/09/17 0854  WBC 5.4   < > 7.0 6.6   NEUTROABS 3.3  --   --   --   HGB 8.3*   < > 8.3* 8.9*  HCT 29.8*   < > 32.4* 33.8*  MCV 82.3   < > 87.8 89.2  PLT 493*   < > 615* 598*   < > = values in this interval not displayed.   Cardiac Enzymes: Recent Labs  Lab 08/03/17 1949  TROPONINI <0.03   BNP: Invalid input(s): POCBNP CBG: No results for input(s): GLUCAP in the last 168 hours.    Disposition and Follow-up:    DISPOSITION: Inpatient rehab   DISCHARGE FOLLOW-UP Follow-up Information    [name], MD. Schedule an appointment as soon as possible for a visit in 2 week(s).   Specialty:  Internal Medicine Why:  Appointmnet: Sep 04, 2017 @ 8:30am Contact information: [address] [place] [address] [phone]        Bensimhon, Bevelyn Buckles, MD. Schedule an appointment as soon as possible for a visit in 2 week(s).   Specialty:  Cardiology Contact information: [address] [place] [address] [phone]            Time coordinating discharge:  35 minutes Signed:   Thad Ranger M.D. [facility] 08/09/2017, 11:39 AM Pager: [phone]

## 2017-08-09 NOTE — Progress Notes (Signed)
PMR Admission Coordinator Pre-Admission Assessment  Patient: Dana Abbott is an 51 y.o., female MRN: [id number] DOB: [birth year] Height: 4\' 9"  (144.8 cm) Weight: (!) 159 kg (350 lb 8.5 oz)                                                                                                                                                  Insurance Information HMO:     PPO:      PCP:      IPA:      80/20:      OTHER:  PRIMARY: Medicaid  Access      Policy#: [id number]      Subscriber: Self CM Name:       Phone#:      Fax#:  Pre-Cert#: Coverage code: [id number]      Employer:  Benefits:  Phone #: [phone]     Name: Verified via an automated system  Eff. Date: Eligible as of 08/03/17     Deduct:       Out of Pocket Max:       Life Max:  CIR: Covered per Medicaid guidelines         SECONDARY: None        Emergency Contact Information         Contact Information    Name Relation Home Work Mobile   [place] Mother   [phone]   Lyna Poser Sister   [phone]     Current Medical History  Patient Admitting Diagnosis: Debility  History of Present Illness: Dana Abbott is a 52 year old female with history of OSAwith home oxygen 1.5 L as needed, CAD, CHF (prior CIR stay for debility), gastric bypass with chronic irons deficiency anemia, morbid obesity, who was admitted on 08/03/17 with reports of fall onto right chest and knee a week PTA with progressive weakness, increase in BLE edema, right knee pain, and SOB. Work up revealed acute on chronic diastolic CHF with CAP LLL and anemia with Hgb 8.3. She was treated with IV diuresis and started on Rocephin and Zithromax. She received one unit PRBC for anemia and was started on B12 supplementation. She has had severe pain right knee and X rays revealed severe OA. Dr. Charlann Boxer consulted for input and knee injected on 4/12 with DepoMedrol. Ortho recommended weight loss (needs BMI <40 for surgical intervention), local  measures as well as resumption of Mobic. She continues to have hypoxia with activity and 2D echo done revealing EF 65-70% with no wall abnormality, left ventricular septum with diastolic/systolic flattening and increased right ventricular pressures consistent with severe pulmonary HTN. Cardiology consulted for input with cardiaccatheterization4/17/2019with normal coronary arteries.Patient remains on Demadex. CTA lungs done to rule out PE and showed changes consistent with pulmonary HTN and hilar and mediastinal lymph nodes of uncertain significance (question prior granulomatous disease).Subcutaneous Lovenox for DVT prophylaxis.PT  and OT evaluations completed with recommendations of physical medicine rehab consult. Patient was admitted for a comprehensive rehab program 08/09/17.   Past Medical History      Past Medical History:  Diagnosis Date  . Anemia    Reportedly from heavy menses and pernicious anemia  . Arthritis    "knees" (07/31/2015)  . CHF (congestive heart failure) (HCC) ?2003  . DVT (deep venous thrombosis) (HCC) "after my heart attack"   "one of my legs"  . Heavy menses   . History of blood transfusion "I've had alot"   "I'm anemic"  . Myocardial infarction [facility]) 2003?  . OSA (obstructive sleep apnea) 2003  . Stroke [facility]) ?2003    Family History  family history includes COPD in her father; Diabetes type II in her daughter; Hypertension in her mother; Stroke in her mother.  Prior Rehab/Hospitalizations:  Has the patient had major surgery during 100 days prior to admission? No  Current Medications   Current Facility-Administered Medications:  .  0.9 %  sodium chloride infusion, 250 mL, Intravenous, PRN, Bensimhon, Bevelyn Buckles, MD .  acetaminophen (TYLENOL) tablet 650 mg, 650 mg, Oral, Q4H PRN, Bensimhon, Bevelyn Buckles, MD .  albuterol (PROVENTIL) (2.5 MG/3ML) 0.083% nebulizer solution 2.5 mg, 2.5 mg, Nebulization, Q2H PRN, Bensimhon, Bevelyn Buckles, MD .  cefdinir  (OMNICEF) capsule 300 mg, 300 mg, Oral, Q12H, Rai, Ripudeep K, MD .  cyanocobalamin ((VITAMIN B-12)) injection 1,000 mcg, 1,000 mcg, Intramuscular, Daily, Bensimhon, Bevelyn Buckles, MD, 1,000 mcg at 08/09/17 1119 .  diclofenac sodium (VOLTAREN) 1 % transdermal gel 2 g, 2 g, Topical, TID, Bensimhon, Bevelyn Buckles, MD, 2 g at 08/09/17 1101 .  enoxaparin (LOVENOX) injection 40 mg, 40 mg, Subcutaneous, Q24H, Bensimhon, Bevelyn Buckles, MD, 40 mg at 08/09/17 0900 .  ferrous sulfate tablet 325 mg, 325 mg, Oral, TID WC, Bensimhon, Bevelyn Buckles, MD, 325 mg at 08/09/17 0846 .  guaiFENesin (MUCINEX) 12 hr tablet 600 mg, 600 mg, Oral, BID, Bensimhon, Bevelyn Buckles, MD, 600 mg at 08/09/17 1059 .  HYDROcodone-acetaminophen (NORCO/VICODIN) 5-325 MG per tablet 1-2 tablet, 1-2 tablet, Oral, Q6H PRN, Bensimhon, Bevelyn Buckles, MD, 2 tablet at 08/08/17 1859 .  lidocaine (XYLOCAINE) 1 % (with pres) injection 10 mL, 10 mL, Other, Once, Bensimhon, Bevelyn Buckles, MD .  meloxicam (MOBIC) tablet 15 mg, 15 mg, Oral, Daily, Bensimhon, Bevelyn Buckles, MD, 15 mg at 08/09/17 1058 .  methylPREDNISolone acetate (DEPO-MEDROL) injection 80 mg, 80 mg, Intra-articular, Once, Bensimhon, Bevelyn Buckles, MD .  ondansetron [facility]) injection 4 mg, 4 mg, Intravenous, Q6H PRN, Bensimhon, Bevelyn Buckles, MD .  potassium chloride SA (K-DUR,KLOR-CON) CR tablet 20 mEq, 20 mEq, Oral, Daily, Bensimhon, Bevelyn Buckles, MD, 20 mEq at 08/09/17 1059 .  sodium chloride flush (NS) 0.9 % injection 3 mL, 3 mL, Intravenous, Q12H, Bensimhon, Bevelyn Buckles, MD, 3 mL at 08/08/17 2336 .  sodium chloride flush (NS) 0.9 % injection 3 mL, 3 mL, Intravenous, Q12H, Bensimhon, Daniel R, MD .  sodium chloride flush (NS) 0.9 % injection 3 mL, 3 mL, Intravenous, PRN, Bensimhon, Bevelyn Buckles, MD .  torsemide (DEMADEX) tablet 20 mg, 20 mg, Oral, Daily, Graciella Freer, PA-C .  torsemide [facility]) tablet 40 mg, 40 mg, Oral, Daily, Graciella Freer, PA-C, 40 mg at 08/09/17 1118  Patients Current Diet: Diet renal/carb  modified with fluid restriction Diet-HS Snack? Nothing; Fluid restriction: 1200 mL Fluid; Room service appropriate? Yes; Fluid consistency: Thin  Precautions / Restrictions Precautions Precautions: Fall Precaution Comments: monitor O2 sats,  desatted on Room Air Restrictions Weight Bearing Restrictions: Yes   Has the patient had 2 or more falls or a fall with injury in the past year?No, 1 fall without injury just a couple days PTA.  Prior Activity Level Community (5-7x/wk): Prior to admission patient was fully independent and active.  She drove, was out in the community daily, and helped care for her grandson.   Home Assistive Devices / Equipment Home Assistive Devices/Equipment: Oxygen, Environmental consultant (specify type), Eyeglasses Home Equipment: Grab bars - tub/shower, Cane - single point, Walker - 4 wheels  Prior Device Use: Indicate devices/aids used by the patient prior to current illness, exacerbation or injury? Walker, Rollator   Prior Functional Level Prior Function Level of Independence: Independent, Independent with assistive device(s) Comments: Rolling walker for amb; drives, cares fo rgrandson  Self Care: Did the patient need help bathing, dressing, using the toilet or eating? Independent  Indoor Mobility: Did the patient need assistance with walking from room to room (with or without device)? Independent  Stairs: Did the patient need assistance with internal or external stairs (with or without device)? Independent  Functional Cognition: Did the patient need help planning regular tasks such as shopping or remembering to take medications? Independent  Current Functional Level Cognition  Overall Cognitive Status: Within Functional Limits for tasks assessed Orientation Level: Oriented X4    Extremity Assessment (includes Sensation/Coordination)  Upper Extremity Assessment: Overall WFL for tasks assessed  Lower Extremity Assessment: Defer to PT evaluation(R knee  pain)    ADLs  Overall ADL's : Needs assistance/impaired Grooming: Set up, Sitting Upper Body Bathing: Set up, Sitting Lower Body Bathing: Moderate assistance, Bed level Upper Body Dressing : Set up, Sitting Lower Body Dressing: Moderate assistance, Bed level Lower Body Dressing Details (indicate cue type and reason): rolling side/side Toilet Transfer: +2 for physical assistance, Moderate assistance Functional mobility during ADLs: Moderate assistance, +2 for physical assistance General ADL Comments: Pt typically leans over to donn/doff socks; may benefi tform use of AE    Mobility  Overal bed mobility: Needs Assistance Bed Mobility: Supine to Sit, Sit to Supine Supine to sit: Min guard Sit to supine: Mod assist General bed mobility comments: Minguard for safety with pt coming to sit; reliant on bedrails and momentum for scoot to EOB; VERY inefficient reciprocal scoot backwards, shifts weight laterally, but decr anterior shift of upper trunk to take advantage of head-hips relationship; Mod assist to help LEs into bed    Transfers  Overall transfer level: Needs assistance Equipment used: Rolling walker (2 wheeled)(Bari RW on shrotest setting) Transfers: Sit to/from Stand Sit to Stand: +2 safety/equipment, Mod assist General transfer comment: Dependent on momentum, Painful R knee    Ambulation / Gait / Stairs / Wheelchair Mobility  Ambulation/Gait Ambulation/Gait assistance: Mod assist, +2 safety/equipment(Very close chair follow) Ambulation Distance (Feet): 15 Feet Assistive device: Rolling walker (2 wheeled) Gait Pattern/deviations: Decreased step length - right, Decreased step length - left, Shuffle General Gait Details: Unable today; Limited by R knee pain and to go for imaging     Posture / Balance Balance Overall balance assessment: History of Falls, Needs assistance Sitting balance-Leahy Scale: Good Standing balance-Leahy Scale: Poor Standing balance comment:  dependent on support    Special needs/care consideration BiPAP/CPAP: Not PTA CPM: No Continuous Drip IV: No Dialysis: No         Life Vest: No Oxygen: Yes 1.5L PRN if below 90 SpO2 Special Bed: No Trach Size: No Wound Vac (area): No  Skin: WDL                               Bowel mgmt: None documented since admission  Bladder mgmt: External catheter  Diabetic mgmt: No     Previous Home Environment Living Arrangements: Alone Available Help at Discharge: Family, Available PRN/intermittently(Daughter lives near; pt helps give care for 28 yo grandson) Type of Home: Apartment Home Layout: One level Home Access: Level entry Bathroom Shower/Tub: Engineer, manufacturing systems: Standard Bathroom Accessibility: Yes How Accessible: Accessible via walker(not wc accessible) Home Care Services: No  Discharge Living Setting Plans for Discharge Living Setting: Patient's home, Apartment, Alone Type of Home at Discharge: Apartment Discharge Home Layout: One level Discharge Home Access: Level entry, Other (comment)(but 15 steps to enter daughter's apt. with both hand rails) Discharge Bathroom Shower/Tub: Tub/shower unit, Other (comment)(has a bench ) Discharge Bathroom Toilet: Handicapped height Discharge Bathroom Accessibility: Yes How Accessible: Accessible via walker(but did not need to use in bathroom PTA) Does the patient have any problems obtaining your medications?: No  Social/Family/Support Systems Patient Roles: Parent, Other (Comment)(Daughter, Gandmother ) Contact Information: Patient to arrage discharge support from either sister or daughter  Anticipated Caregiver: sister or daughter  Anticipated Caregiver's Contact Information: see above  Ability/Limitations of Caregiver: daughter unable to drive per pt. report  Caregiver Availability: 24/7 Discharge Plan Discussed with Primary Caregiver: (discussed with patient ) Is Caregiver In Agreement with Plan?: (patient in  agreement with plan ) Does Caregiver/Family have Issues with Lodging/Transportation while Pt is in Rehab?: (Daughter cannot drive per pt report and sister lives 3 hours)  Goals/Additional Needs Patient/Family Goal for Rehab: PT/OT: Supervision  Expected length of stay: 10-15 days  Cultural Considerations: None Dietary Needs: Heart Healthy diet restrictions Equipment Needs: TBD Special Service Needs: Needs to be set up with out patient sleep study after discharge  Additional Information: Has home O2, used 1.5L PRN if she dropped below 90 SpO2 Pt/Family Agrees to Admission and willing to participate: Yes Program Orientation Provided & Reviewed with Pt/Caregiver Including Roles  & Responsibilities: Yes  Decrease burden of Care through IP rehab admission: No  Possible need for SNF placement upon discharge: No  Patient Condition: This patient's medical and functional status has changed since the consult dated: 08/06/17 in which the Rehabilitation Physician determined and documented that the patient's condition is appropriate for intensive rehabilitative care in an inpatient rehabilitation facility. See "History of Present Illness" (above) for medical update. Functional changes are: Mod A +2 transfers. Patient's medical and functional status update has been discussed with the Rehabilitation physician and patient remains appropriate for inpatient rehabilitation. Will admit to inpatient rehab today.  Preadmission Screen Completed By:  Fae Pippin, 08/09/2017 1:14 PM ______________________________________________________________________   Discussed status with Dr. Wynn Banker on 08/09/17 at 1330  and received telephone approval for admission today.  Admission Coordinator:  Fae Pippin, time 1330/Date 08/09/17             Cosigned by: Erick Colace, MD at 08/09/2017 1:46 PM  Revision History

## 2017-08-10 ENCOUNTER — Inpatient Hospital Stay (HOSPITAL_COMMUNITY): Payer: Medicaid Other | Admitting: Physical Therapy

## 2017-08-10 ENCOUNTER — Inpatient Hospital Stay (HOSPITAL_COMMUNITY): Payer: Medicaid Other

## 2017-08-10 ENCOUNTER — Inpatient Hospital Stay (HOSPITAL_COMMUNITY): Payer: Medicaid Other | Admitting: Occupational Therapy

## 2017-08-10 DIAGNOSIS — M1711 Unilateral primary osteoarthritis, right knee: Secondary | ICD-10-CM

## 2017-08-10 DIAGNOSIS — Z9981 Dependence on supplemental oxygen: Secondary | ICD-10-CM

## 2017-08-10 DIAGNOSIS — D638 Anemia in other chronic diseases classified elsewhere: Secondary | ICD-10-CM

## 2017-08-10 LAB — COMPREHENSIVE METABOLIC PANEL
ALK PHOS: 77 U/L (ref 38–126)
ALT: 11 U/L — AB (ref 14–54)
AST: 17 U/L (ref 15–41)
Albumin: 2.8 g/dL — ABNORMAL LOW (ref 3.5–5.0)
Anion gap: 11 (ref 5–15)
BILIRUBIN TOTAL: 0.6 mg/dL (ref 0.3–1.2)
BUN: 12 mg/dL (ref 6–20)
CALCIUM: 8.4 mg/dL — AB (ref 8.9–10.3)
CHLORIDE: 95 mmol/L — AB (ref 101–111)
CO2: 33 mmol/L — ABNORMAL HIGH (ref 22–32)
CREATININE: 0.6 mg/dL (ref 0.44–1.00)
GFR calc Af Amer: >60 mL/min (ref >60)
Glucose, Bld: 96 mg/dL (ref 65–99)
Potassium: 4.2 mmol/L (ref 3.5–5.1)
Sodium: 139 mmol/L (ref 135–145)
TOTAL PROTEIN: 6.8 g/dL (ref 6.5–8.1)

## 2017-08-10 LAB — CBC WITH DIFFERENTIAL/PLATELET
Basophils Absolute: 0 K/uL (ref 0.0–0.1)
Basophils Relative: 0 %
Eosinophils Absolute: 0.3 K/uL (ref 0.0–0.7)
Eosinophils Relative: 5 %
HCT: 35.4 % — ABNORMAL LOW (ref 36.0–46.0)
Hemoglobin: 9.2 g/dL — ABNORMAL LOW (ref 12.0–15.0)
Lymphocytes Relative: 19 %
Lymphs Abs: 1.3 K/uL (ref 0.7–4.0)
MCH: 23 pg — ABNORMAL LOW (ref 26.0–34.0)
MCHC: 26 g/dL — ABNORMAL LOW (ref 30.0–36.0)
MCV: 88.5 fL (ref 78.0–100.0)
Monocytes Absolute: 0.4 K/uL (ref 0.1–1.0)
Monocytes Relative: 6 %
Neutro Abs: 4.8 K/uL (ref 1.7–7.7)
Neutrophils Relative %: 70 %
Platelets: 588 K/uL — ABNORMAL HIGH (ref 150–400)
RBC: 4 MIL/uL (ref 3.87–5.11)
RDW: 26.6 % — ABNORMAL HIGH (ref 11.5–15.5)
WBC: 6.8 K/uL (ref 4.0–10.5)

## 2017-08-10 MED ORDER — TORSEMIDE 20 MG PO TABS
40.0000 mg | ORAL_TABLET | Freq: Every day | ORAL | Status: DC
  Administered 2017-08-13 – 2017-08-16 (×4): 40 mg via ORAL
  Filled 2017-08-10 (×7): qty 2

## 2017-08-10 NOTE — Progress Notes (Signed)
Occupational Therapy Session Note  Patient Details  Name: Dana Abbott MRN: [id number] Date of Birth: 1966/04/16  Today's Date: 08/10/2017 OT Individual Time: 1130-1200 OT Individual Time Calculation (min): 30 min    Short Term Goals: Week 1:  OT Short Term Goal 1 (Week 1): Pt will bathe UB maintaining O2 sats greater than 90% OT Short Term Goal 2 (Week 1): Pt will sit to stand wiht MIN A OT Short Term Goal 3 (Week 1): Pt will transfer to [facility] wiht touching A and LRAD OT Short Term Goal 4 (Week 1): Pt will bathe UB wiht touching A and AE PRN  Skilled Therapeutic Interventions/Progress Updates:  Balance/vestibular training;Discharge planning;Pain management;Self Care/advanced ADL retraining;Therapeutic Activities;UE/LE Coordination activities;Disease mangement/prevention;Functional mobility training;Patient/family education;Skin care/wound managment;Therapeutic Exercise;Community reintegration;DME/adaptive equipment instruction;Psychosocial support;UE/LE Strength taining/ROM   1:1 Focus on transfer training with RW into wider w/c 24x18 for better postioning and comfort with min A with more than reasonable amt of time. O2 sats decr to 85 % but able to recover with pursed lip breathing and time. Ordered a wider BSC and w/c through portable.   Therapy Documentation Precautions:  Precautions Precautions: Fall Precaution Comments: monitor O2 sats, desatted on Room Air Restrictions Weight Bearing Restrictions: No General: General Chart Reviewed: Yes Vital Signs:  Pain: Pain in right knee due to position she was sitting in  See Function Navigator for Current Functional Status.   Therapy/Group: Individual Therapy  Roney Mans [facility] 08/10/2017, 2:09 PM

## 2017-08-10 NOTE — Evaluation (Signed)
Occupational Therapy Assessment and Plan  Patient Details  Name: Dana Abbott MRN: [id number] Date of Birth: 10-19-1965  OT Diagnosis: acute pain, muscle weakness (generalized), pain in joint and swelling of limb Rehab Potential: Rehab Potential (ACUTE ONLY): Good ELOS: (12-14)   Today's Date: 08/10/2017 OT Individual Time: 7672-0947 OT Individual Time Calculation (min): 75 min     Problem List:  Patient Active Problem List   Diagnosis Date Noted  . Supplemental oxygen dependent   . Anemia of chronic disease   . Chronic respiratory failure ([place])   . Precordial chest pain   . PAH (pulmonary artery hypertension) ([place])   . Acute on chronic anemia   . Pain   . Super-super obese ([place])   . Community acquired pneumonia of left lower lobe of lung ([place])   . Symptomatic anemia 08/03/2017  . Myocardial infarction ([place])   . Stroke ([place])   . Anemia   . Arthritis   . CHF (congestive heart failure) ([place])   . DVT (deep venous thrombosis) ([place])   . Heavy menses   . Vitamin B 12 deficiency 09/29/2015  . Debility 09/27/2015  . Obesity hypoventilation syndrome ([place])   . Exertional dyspnea   . Requires supplemental oxygen   . Primary osteoarthritis of right knee   . Abnormality of gait   . Fluid retention   . Hypoalbuminemia due to protein-calorie malnutrition ([place])   . Iron deficiency anemia   . S/P gastric bypass   . Vitamin D deficiency   . OSA treated with BiPAP   . Pernicious anemia 09/22/2015  . CAD (coronary artery disease), native coronary artery 09/22/2015  . History of DVT (deep vein thrombosis) 09/22/2015  . Dyspnea 07/31/2015  . Acute on chronic diastolic heart failure ([place]) 07/30/2015  . Hypoxia 07/30/2015  . Blood loss anemia 07/30/2015  . OSA (obstructive sleep apnea) 04/24/2001    Past Medical History:  Past Medical History:  Diagnosis Date  . Anemia    Reportedly from heavy menses and pernicious anemia  . Arthritis    "knees" (07/31/2015)  . CHF (congestive  heart failure) ([place]) ?2003  . DVT (deep venous thrombosis) ([place]) "after my heart attack"   "one of my legs"  . Heavy menses   . History of blood transfusion "I've had alot"   "I'm anemic"  . Myocardial infarction [facility]) 2003?  . OSA (obstructive sleep apnea) 2003  . Stroke [facility]) ?2003   Past Surgical History:  Past Surgical History:  Procedure Laterality Date  . CARPAL TUNNEL RELEASE Right   . [place]; 1991; 1993  . CHOLECYSTECTOMY  2004   w/gastric OR  . GASTRIC BYPASS  2004  . RIGHT/LEFT HEART CATH AND CORONARY ANGIOGRAPHY N/A 08/08/2017   Procedure: RIGHT/LEFT HEART CATH AND CORONARY ANGIOGRAPHY;  Surgeon: Jolaine Artist, MD;  Location: [facility] [place] [facility] CV [facility];  Service: Cardiovascular;  Laterality: N/A;  . TONSILLECTOMY      Assessment & Plan Clinical Impression: Dana Abbott is a 52 year old female with history of OSAwith home oxygen 1.5 L as needed, CAD, CHF (prior CIR stay for debility), gastric bypass with chronic irons deficiency anemia, morbid obesity, who was admitted on 08/03/17 with reports of fall onto right chest and knee a week PTA with progressive weakness, increase in BLE edema, right knee pain and SOB. Work up revealed acute on chronic diastolic CHF with CAP LLL and anemia with Hgb 8.3. She was treated with IV diuresis and started on Rocephin and Zithromax.  She received one unit PRBC for anemia and was started on B12 supplementation.  She has had severe pain right knee and X rays revealed severe OA. Dr. Alvan Dame consulted for input and knee injected on 4/12 with DepoMedrol. Ortho recommended weight loss (needs BMI <40 for surgical intervention), local measures as well as resumption of Mobic. She continues to have hypoxia with activity and 2D echo done revealing EF 65-70% with no wall abnormality, left ventricular septum with diastolic/systolic flattening and increased right ventricular pressures consistent with severe pulmonary HTN. Cardiology consulted  for input with cardiaccatheterization4/17/2019with normal coronary arteries.Patient remains on Demadex. CTA lungs done to rule out PE and showed changes consistent with pulmonary HTN and hilar and mediastinal lymph nodes of uncertain significance (question prior granulomatous disease).Subcutaneous Lovenox for DVT prophylaxis.PT and OT evaluations completed with recommendations of physical medicine rehab consult. Patient was admitted for a comprehensive rehab program  Patient currently requires min with basic self-care skills secondary to muscle weakness, decreased cardiorespiratoy endurance and decreased oxygen support and decreased standing balance, decreased postural control and decreased balance strategies.  Prior to hospitalization, patient could complete BADL/IADL with modified independent .  Patient will benefit from skilled intervention to increase independence with basic self-care skills prior to discharge home with care partner.  Anticipate patient will require intermittent supervision and follow up home health.  OT - End of Session Endurance Deficit: Yes Endurance Deficit Description: Pt desat wiht all activity requring frequent rest breaks and VC for breathing tecnique OT Assessment Rehab Potential (ACUTE ONLY): Good OT Barriers to Discharge: Incontinence OT Patient demonstrates impairments in the following area(s): Balance;Edema;Endurance;Pain OT Basic ADL's Functional Problem(s): Grooming;Dressing;Bathing;Toileting OT Advanced ADL's Functional Problem(s): Simple Meal Preparation OT Transfers Functional Problem(s): Toilet;Tub/Shower OT Plan OT Intensity: Minimum of 1-2 x/day, 45 to 90 minutes OT Frequency: 5 out of 7 days OT Duration/Estimated Length of Stay: (12-14) OT Treatment/Interventions: Balance/vestibular training;Discharge planning;Pain management;Self Care/advanced ADL retraining;Therapeutic Activities;UE/LE Coordination activities;Disease  mangement/prevention;Functional mobility training;Patient/family education;Skin care/wound managment;Therapeutic Exercise;Community reintegration;DME/adaptive equipment instruction;Psychosocial support;UE/LE Strength taining/ROM OT Self Feeding Anticipated Outcome(s): no goal OT Basic Self-Care Anticipated Outcome(s): MOD I OT Toileting Anticipated Outcome(s): MOD I OT Bathroom Transfers Anticipated Outcome(s): MOD I toilet; S shower OT Recommendation Patient destination: Home Follow Up Recommendations: Home health OT Equipment Recommended: 3 in 1 bedside comode;To be determined Equipment Details: has shower bench   Skilled Therapeutic Intervention 1:1. Pt educated on OT role/purpose, CIR, ELOS, and pt centered goal setting. Pt dons socks in long sitting in bed and transitions to EOB with supervision/use of bed rails. Pt bathes EOB with A to wash buttocks, back and B feet. Pt dons bra with A to thread RUE d/t getting twisted on body habitus and dons pull over shirt with supervision. Pt dons skirt with supervision seated and stands with lifting A to assume standing position and min A for balance to pull skirt down in back. Pt transitions to w/c with RW with touching A and Vc for reach back. OT unable to locate bariatric [facility] and notified next clinician to continue to search for appropriate DME. Exited session with pt seated in w/c, call light in reach and all need smet  OT Evaluation Precautions/Restrictions  Precautions Precautions: Fall Precaution Comments: monitor O2 sats, desatted on Room Air Restrictions Weight Bearing Restrictions: No General Chart Reviewed: Yes Vital Signs  Pain Pain Assessment Pain Scale: 0-10 Pain Score: 0-No pain Home Living/Prior Functioning Home Living Family/patient expects to be discharged to:: Private residence Living Arrangements: Alone Available Help at  Discharge: Family, Available PRN/intermittently Type of Home: Apartment Home Access: Level  entry Home Layout: One level Bathroom Shower/Tub: Optometrist: Yes  Lives With: Daughter IADL History Type of Occupation: biblical studies Prior Function Level of Independence: Independent with basic ADLs(MOD I wiht ROllator) Comments: Rolling walker for amb; drives, cares fo rgrandson ADL   Vision Baseline Vision/History: No visual deficits Vision Assessment?: No apparent visual deficits Perception  Perception: Within Functional Limits Praxis Praxis: Intact Cognition Overall Cognitive Status: Within Functional Limits for tasks assessed Orientation Level: Person;Place;Situation Person: Oriented Place: Oriented Situation: Oriented Year: 2019 Month: April Day of Week: Correct Memory: Appears intact Immediate Memory Recall: Sock;Blue;Bed Memory Recall: Sock;Blue;Bed Memory Recall Sock: Without Cue Memory Recall Blue: Without Cue Memory Recall Bed: Without Cue Attention: Selective Selective Attention: Appears intact Awareness: Appears intact Problem Solving: Appears intact Safety/Judgment: Appears intact Sensation Sensation Proprioception: Appears Intact Coordination Gross Motor Movements are Fluid and Coordinated: Yes Fine Motor Movements are Fluid and Coordinated: No Motor  Motor Motor: Within Functional Limits Mobility  Transfers Transfers: Sit to Stand;Stand to Sit Sit to Stand: 3: Mod assist Sit to Stand Details: Verbal cues for safe use of DME/AE;Verbal cues for precautions/safety Stand to Sit: 3: Mod assist Stand to Sit Details (indicate cue type and reason): Verbal cues for safe use of DME/AE;Verbal cues for precautions/safety  Trunk/Postural Assessment  Cervical Assessment Cervical Assessment: Exceptions to WFL(head forward) Thoracic Assessment Thoracic Assessment: Exceptions to WFL(rounded shoulders) Lumbar Assessment Lumbar Assessment: Within Functional Limits Postural Control Postural Control:  Within Functional Limits  Balance Balance Balance Assessed: Yes Static Standing Balance Static Standing - Level of Assistance: 4: Min assist Static Standing - Comment/# of Minutes: dressing Extremity/Trunk Assessment RUE Assessment RUE Assessment: Exceptions to WFL(generalized weakness) LUE Assessment LUE Assessment: Exceptions to WFL(generalized weakness)   See Function Navigator for Current Functional Status.   Refer to Care Plan for Long Term Goals  Recommendations for other services: Therapeutic Recreation  Kitchen group and Stress management   Discharge Criteria: Patient will be discharged from OT if patient refuses treatment 3 consecutive times without medical reason, if treatment goals not met, if there is a change in medical status, if patient makes no progress towards goals or if patient is discharged from hospital.  The above assessment, treatment plan, treatment alternatives and goals were discussed and mutually agreed upon: by patient  Tonny Branch 08/10/2017, 12:28 PM

## 2017-08-10 NOTE — Progress Notes (Signed)
[facility] PHYSICAL MEDICINE & REHABILITATION     PROGRESS NOTE  Subjective/Complaints:  Patient seen lying in bed this morning.  She states she is done well overnight.  She states she tried to begin therapies today.  She states she wants her IV access discontinued.    ROS: Denies CP, SOB, N/V/D  Objective: Vital Signs: Blood pressure (!) 100/54, pulse 72, temperature 98 F (36.7 C), temperature source Oral, resp. rate 18, height 4\' 9"  (1.448 m), weight (!) 155.5 kg (342 lb 12.8 oz), SpO2 97 %. No results found. Recent Labs    08/09/17 0854 08/10/17 0600  WBC 6.6 6.8  HGB 8.9* 9.2*  HCT 33.8* 35.4*  PLT 598* 588*   Recent Labs    08/09/17 0854 08/10/17 0600  NA 138 139  K 4.2 4.2  CL 97* 95*  GLUCOSE 127* 96  BUN 5* 12  CREATININE 0.49 0.60  CALCIUM 8.6* 8.4*   CBG (last 3)  No results for input(s): GLUCAP in the last 72 hours.  Wt Readings from Last 3 Encounters:  08/10/17 (!) 155.5 kg (342 lb 12.8 oz)  08/09/17 (!) 159 kg (350 lb 8.5 oz)  11/02/15 (!) 167.4 kg (369 lb)    Physical Exam:  BP (!) 100/54 (BP Location: Right Arm)   Pulse 72   Temp 98 F (36.7 C) (Oral)   Resp 18   Ht 4\' 9"  (1.448 m)   Wt (!) 155.5 kg (342 lb 12.8 oz)   SpO2 97%   BMI 74.18 kg/m  Constitutional: NAD. Obese.  HENT: Normocephalic. Atraumatic. Eyes:EOMare normal. No discharge.  Cardiovascular:Normal rate,regular rhythmand no JVD. Respiratory:Effort normaland breath sounds normal. +[place]. GI: Bowel sounds are normal. She exhibitsno distension.  Musculoskeletal:Right knee with mild swelling Lymphedema b/l LE Neurological: She isalertand oriented.  Motor: 5/5 bilateral deltoid bicep tricep grip  3/5 hip flexion, knee extension, 4+/5 at the ankle dorsiflexors  Skin: Skin iswarmand dry. Intact. Psych: Normal mood and behavior.   Assessment/Plan: 1. Functional deficits secondary to debility which require 3+ hours per day of interdisciplinary therapy in a  comprehensive inpatient rehab setting. Physiatrist is providing close team supervision and 24 hour management of active medical problems listed below. Physiatrist and rehab team continue to assess barriers to discharge/monitor patient progress toward functional and medical goals.  Function:  Bathing Bathing position      Bathing parts      Bathing assist        Upper Body Dressing/Undressing Upper body dressing                    Upper body assist        Lower Body Dressing/Undressing Lower body dressing                                  Lower body assist        Toileting Toileting          Toileting assist     Transfers Chair/bed transfer             Locomotion Ambulation           Wheelchair          Cognition Comprehension    Expression    Social Interaction    Problem Solving    Memory      Medical Problem List and Plan: 1.Debilitysecondary to acute on chronic disystolic heart failure with  underlying chronic respiratory failure  Begin CIR 2. DVT Prophylaxis/Anticoagulation: Lovenox. Monitor for any bleeding episodes 3. Pain Management:Voltaren gel 3 times daily, Mobic 15 mg daily, hydrocodone as needed 4. Mood:Provide emotional support 5. Neuropsych: This patientiscapable of making decisions on herown behalf. 6. Skin/Wound Care:Routine skin checks 7. Fluids/Electrolytes/Nutrition:Routine I/O's   BMP within acceptable range on 4/19 8. Acute on chronic diastolic CHF: Weight daily. Heart healthy diet.Demadex 20 as directed. Monitor for any signs of fluid overload Filed Weights   08/09/17 1645 08/10/17 0543  Weight: (!) 156.9 kg (346 lb) (!) 155.5 kg (342 lb 12.8 oz)  9. Severe pulmonary HTN: with hypoxia:Monitor with increased mobility 10 LLL ZOX:[id number] course of Omnicef, will clarify date. 11. Anemia of chronic disease with B12 and iron deficiency:    Has been non-complianat with iron/B 12  injections. Monitor with serial checks. Educate on importance of medical compliance.    Continue iron supplement TID with B12 injections.   Hb 9.2 on 4/19   Cont to monitor 12. Severe right knee OA: Continue Mobic ? as well as local measures with ice. 13. 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 Laxative assistance 15. Supplemental oxygen dependent   Was using 1.5L PRN, which started 1 week PTA  Will wean as tolerated  LOS (Days) 1 A FACE TO FACE EVALUATION WAS PERFORMED  Dequita Schleicher Karis Juba 08/10/2017 8:54 AM

## 2017-08-10 NOTE — Plan of Care (Signed)
  Problem: Consults Goal: RH GENERAL PATIENT EDUCATION Description See Patient Education module for education specifics. Outcome: Not Progressing   Problem: RH BLADDER ELIMINATION Goal: RH STG MANAGE BLADDER WITH ASSISTANCE Description STG Manage Bladder With Min Assistance  Outcome: Not Progressing   Problem: RH SKIN INTEGRITY Goal: RH STG SKIN FREE OF INFECTION/BREAKDOWN Description No new breakdown with Min assist.  Outcome: Not Progressing   Problem: RH SAFETY Goal: RH STG DECREASED RISK OF FALL WITH ASSISTANCE Description STG Decreased Risk of Fall With [employer].  Outcome: Not Progressing   Problem: RH PAIN MANAGEMENT Goal: RH STG PAIN MANAGED AT OR BELOW PT'S PAIN GOAL Description Less than 3 out of 10.  Outcome: Not Progressing   Pt needing max assist with all goals at this point.

## 2017-08-10 NOTE — Progress Notes (Signed)
Occupational Therapy Session Note  Patient Details  Name: Dana Abbott MRN: [id number] Date of Birth: [birth year]  Today's Date: 08/10/2017 OT Individual Time: 7340-3709 OT Individual Time Calculation (min): 28 min    Short Term Goals: Week 1:  OT Short Term Goal 1 (Week 1): Pt will bathe UB maintaining O2 sats greater than 90% OT Short Term Goal 2 (Week 1): Pt will sit to stand wiht MIN A OT Short Term Goal 3 (Week 1): Pt will transfer to [facility] wiht touching A and LRAD OT Short Term Goal 4 (Week 1): Pt will bathe UB wiht touching A and AE PRN  Skilled Therapeutic Interventions/Progress Updates:  Pt received sitting up in w/c in room agreeable to therapy with c/o pain as described below. Discussion with pt re goals for rehab and OT POC. Pt requested to stay in room for session d/t progressive fatigue from previous sessions. Pt completed sit to stand transfer with CGA and remained standing with RW for ~30 sec d/t pain in R knee and fatigue with min A. Vc provided during transfer for self-correction of body mechanics and upright posture. Pt then completed table top task at a raised surface from seated level to address B UE strength/endurance. Pt requested theraband for HEP use. Pt was provided with yellow band and demo-ed several UE exercises. Pt left in w/c with all needs met.   Therapy Documentation Precautions:  Precautions Precautions: Fall Precaution Comments: monitor O2 sats, desatted on Room Air Restrictions Weight Bearing Restrictions: No General: General Chart Reviewed: Yes Pain: Pain Assessment Pain Scale: 0-10 Pain Score: 7  Pain Type: Acute pain Pain Location: Knee Pain Orientation: Right Pain Descriptors / Indicators: Throbbing Pain Intervention(s): Repositioned;Emotional support   Vision Baseline Vision/History: No visual deficits Vision Assessment?: No apparent visual deficits Perception  Perception: Within Functional Limits Praxis Praxis: Intact  See  Function Navigator for Current Functional Status.   Therapy/Group: Individual Therapy  Curtis Sites 08/10/2017, 2:56 PM

## 2017-08-10 NOTE — Progress Notes (Signed)
Social Work Assessment and Plan  Patient Details  Name: Dana Abbott MRN: [id number] Date of Birth: [birth year]  Today's Date: 08/10/2017  Problem List:  Patient Active Problem List   Diagnosis Date Noted  . Supplemental oxygen dependent   . Anemia of chronic disease   . Chronic respiratory failure ([place])   . Precordial chest pain   . PAH (pulmonary artery hypertension) ([place])   . Acute on chronic anemia   . Pain   . Super-super obese ([place])   . Community acquired pneumonia of left lower lobe of lung ([place])   . Symptomatic anemia 08/03/2017  . Myocardial infarction ([place])   . Stroke ([place])   . Anemia   . Arthritis   . CHF (congestive heart failure) ([place])   . DVT (deep venous thrombosis) ([place])   . Heavy menses   . Vitamin B 12 deficiency 09/29/2015  . Debility 09/27/2015  . Obesity hypoventilation syndrome ([place])   . Exertional dyspnea   . Requires supplemental oxygen   . Primary osteoarthritis of right knee   . Abnormality of gait   . Fluid retention   . Hypoalbuminemia due to protein-calorie malnutrition ([place])   . Iron deficiency anemia   . S/P gastric bypass   . Vitamin D deficiency   . OSA treated with BiPAP   . Pernicious anemia 09/22/2015  . CAD (coronary artery disease), native coronary artery 09/22/2015  . History of DVT (deep vein thrombosis) 09/22/2015  . Dyspnea 07/31/2015  . Acute on chronic diastolic heart failure ([employer]) 07/30/2015  . Hypoxia 07/30/2015  . Blood loss anemia 07/30/2015  . OSA (obstructive sleep apnea) 04/24/2001   Past Medical History:  Past Medical History:  Diagnosis Date  . Anemia    Reportedly from heavy menses and pernicious anemia  . Arthritis    "knees" (07/31/2015)  . CHF (congestive heart failure) ([place]) ?2003  . DVT (deep venous thrombosis) ([place]) "after my heart attack"   "one of my legs"  . Heavy menses   . History of blood transfusion "I've had alot"   "I'm anemic"  . Myocardial infarction [facility]) 2003?  . OSA (obstructive  sleep apnea) 2003  . Stroke [facility]) ?2003   Past Surgical History:  Past Surgical History:  Procedure Laterality Date  . CARPAL TUNNEL RELEASE Right   . [place]; 1991; 1993  . CHOLECYSTECTOMY  2004   w/gastric OR  . GASTRIC BYPASS  2004  . RIGHT/LEFT HEART CATH AND CORONARY ANGIOGRAPHY N/A 08/08/2017   Procedure: RIGHT/LEFT HEART CATH AND CORONARY ANGIOGRAPHY;  Surgeon: Jolaine Artist, MD;  Location: [place] CV LAB;  Service: Cardiovascular;  Laterality: N/A;  . TONSILLECTOMY     Social History:  reports that she quit smoking about 26 years ago. Her smoking use included cigarettes. She started smoking about 29 years ago. She has a 2.00 pack-year smoking history. She has never used smokeless tobacco. She reports that she does not drink alcohol or use drugs.  Family / Support Systems Marital Status: Single Patient Roles: Parent, Other (Comment)(dtr, grandmother, sister) Other Supports: Dana Abbott - mother - [phone]; [name] - sister - [phone] Anticipated Caregiver: sister or daughter  Ability/Limitations of Caregiver: daughter unable to drive per pt report  Caregiver Availability: 24/7 Family Dynamics: Pt reports great family support and that they will take care of her no matter what she needs.  Social History Preferred language: English Religion: Christian Read: Yes Write: Yes Employment Status: Disabled Date Retired/Disabled/Unemployed: 2017  Legal History/Current Legal Issues: none reported Guardian/Conservator: N/A - Dr. Posey Pronto has determined that pt is capable of making her own decisions.    Abuse/Neglect Abuse/Neglect Assessment Can Be Completed: Yes Physical Abuse: Denies Verbal Abuse: Denies Sexual Abuse: Denies Exploitation of patient/patient's resources: Denies Self-Neglect: Denies  Emotional Status Pt's affect, behavior and adjustment status: Pt reports being frustrated to be back in this situation on CIR and that her  knee is giving her such a hard time.  Pt is motivated to rehabilitate. Recent Psychosocial Issues: none reported Psychiatric History: none reported Substance Abuse History: none reported  Patient / Family Perceptions, Expectations & Goals Pt/Family understanding of illness & functional limitations: Pt reports a good understanding of her condition. Premorbid pt/family roles/activities: Pt assists dtr with her 8 y/o grandson. Anticipated changes in roles/activities/participation: Pt would like to resume this when she is able. Pt/family expectations/goals: Pt would like to be able to take care of herself when she goes home and wants her knee to feel better.    Community [employer] Agencies: None Premorbid Home Care/DME Agencies: Other (Comment)([facility] for RN in the past and for oxygen) Transportation available at discharge: family Resource referrals recommended: Neuropsychology, Support group (specify)  Discharge Planning Living Arrangements: Alone Support Systems: Children, Parent, Other relatives, Friends/neighbors Type of Residence: Private residence Insurance Resources: Medicaid (specify county)([place]) Financial Resources: [employer] Financial Screen Referred: No Living Expenses: Rent Money Management: Patient Does the patient have any problems obtaining your medications?: No Home Management: Pt has been taking care of this. Patient/Family Preliminary Plans: Pt and her family are working out where pt will go at d/c in order for her to have assistance post d/c. Social Work Anticipated Follow Up Needs: HH/OP Expected length of stay: 10-15 days   Clinical Impression CSW met with pt who was on CIR June 2017 to introduce self and role of CSW, as well as to complete assessment.  Pt remembers how rehab works and is frustrated she fell and is in her again, but knows she can work hard and get back home.  Pt was pleasant with CSW and reports excellent family support and that  they are going to do what pt needs them to do.  Pt is currently unsure if she will go stay with her dtr, or stay in her home, or go to her sister's 3 hours away.  Sister works from home and could be with pt 24/7.  Pt plans to talk with family and figure out what is best.  CSW will continue to follow and assist as needed.  Burnard Enis, Silvestre Mesi 08/10/2017, 2:47 PM

## 2017-08-10 NOTE — Evaluation (Signed)
Physical Therapy Assessment and Plan  Patient Details  Name: Dana Abbott MRN: [id number] Date of Birth: 09/27/1965  PT Diagnosis: Difficulty walking, Muscle weakness and Pain in joint Rehab Potential: Good ELOS: 10-12 days    Today's Date: 08/10/2017 PT Individual Time: 1510-1605 66mn   Problem List:  Patient Active Problem List   Diagnosis Date Noted  . Supplemental oxygen dependent   . Anemia of chronic disease   . Chronic respiratory failure ([place]   . Precordial chest pain   . PAH (pulmonary artery hypertension) ([place]   . Acute on chronic anemia   . Pain   . Super-super obese ([place]   . Community acquired pneumonia of left lower lobe of lung ([place]   . Symptomatic anemia 08/03/2017  . Myocardial infarction ([place]   . Stroke ([place]   . Anemia   . Arthritis   . CHF (congestive heart failure) ([place]   . DVT (deep venous thrombosis) ([place]   . Heavy menses   . Vitamin B 12 deficiency 09/29/2015  . Debility 09/27/2015  . Obesity hypoventilation syndrome ([place]   . Exertional dyspnea   . Requires supplemental oxygen   . Primary osteoarthritis of right knee   . Abnormality of gait   . Fluid retention   . Hypoalbuminemia due to protein-calorie malnutrition ([place]   . Iron deficiency anemia   . S/P gastric bypass   . Vitamin D deficiency   . OSA treated with BiPAP   . Pernicious anemia 09/22/2015  . CAD (coronary artery disease), native coronary artery 09/22/2015  . History of DVT (deep vein thrombosis) 09/22/2015  . Dyspnea 07/31/2015  . Acute on chronic diastolic heart failure ([place] 07/30/2015  . Hypoxia 07/30/2015  . Blood loss anemia 07/30/2015  . OSA (obstructive sleep apnea) 04/24/2001    Past Medical History:  Past Medical History:  Diagnosis Date  . Anemia    Reportedly from heavy menses and pernicious anemia  . Arthritis    "knees" (07/31/2015)  . CHF (congestive heart failure) ([place] ?2003  . DVT (deep venous thrombosis) ([place] "after my heart attack"    "one of my legs"  . Heavy menses   . History of blood transfusion "I've had alot"   "I'm anemic"  . Myocardial infarction ([facility] 2003?  . OSA (obstructive sleep apnea) 2003  . Stroke ([facility] ?2003   Past Surgical History:  Past Surgical History:  Procedure Laterality Date  . CARPAL TUNNEL RELEASE Right   . [place] 1991; 1993  . CHOLECYSTECTOMY  2004   w/gastric OR  . GASTRIC BYPASS  2004  . RIGHT/LEFT HEART CATH AND CORONARY ANGIOGRAPHY N/A 08/08/2017   Procedure: RIGHT/LEFT HEART CATH AND CORONARY ANGIOGRAPHY;  Surgeon: BJolaine Artist MD;  [facility];  Service: Cardiovascular;  Laterality: N/A;  . TONSILLECTOMY      Assessment & Plan Clinical Impression: Patient is a 52year old female with history of OSAwith home oxygen 1.5 L as needed, CAD, CHF (prior CIR stay for debility), gastric bypass with chronic irons deficiency anemia, morbid obesity, who was admitted on 08/03/17 with reports of fall onto right chest and knee a week PTA with progressive weakness, increase in BLE edema, right knee pain and SOB. Work up revealed acute on chronic diastolic CHF with CAP LLL and anemia with Hgb 8.3. She was treated with IV diuresis and started on Rocephin and Zithromax. She received one unit PRBC for anemia and was started on B12 supplementation.  She  has had severe pain right knee and X rays revealed severe OA. Dr. Alvan Dame consulted for input and knee injected on 4/12 with DepoMedrol. Ortho recommended weight loss (needs BMI <40 for surgical intervention), local measures as well as resumption of Mobic. She continues to have hypoxia with activity and 2D echo done revealing EF 65-70% with no wall abnormality, left ventricular septum with diastolic/systolic flattening and increased right ventricular pressures consistent with severe pulmonary HTN. Cardiology consulted for input with cardiaccatheterization4/17/2019with normal coronary arteries.Patient remains on  Demadex. CTA lungs done to rule out PE and showed changes consistent with pulmonary HTN and hilar and mediastinal lymph nodes of uncertain significance (question prior granulomatous disease).    Patient transferred to CIR on 08/09/2017 .   Patient currently requires min with mobility secondary to muscle weakness and muscle joint tightness, decreased cardiorespiratoy endurance and decreased oxygen support and decreased standing balance and decreased balance strategies.  Prior to hospitalization, patient was modified independent  with mobility and lived with Daughter in a [place] home.  Home access is  Level entry.  Patient will benefit from skilled PT intervention to maximize safe functional mobility, minimize fall risk and decrease caregiver burden for planned discharge home with intermittent assist.  Anticipate patient will benefit from follow up [facility] at discharge.  PT - End of Session Activity Tolerance: Tolerates < 10 min activity with changes in vital signs Endurance Deficit: Yes Endurance Deficit Description: Pt desat to ~85% with transfers on 2L/min O2.  PT Assessment Rehab Potential (ACUTE/IP ONLY): Good PT Barriers to Discharge: Inaccessible home environment;Decreased caregiver support;Medical stability;Weight PT Patient demonstrates impairments in the following area(s): Balance;Edema;Endurance;Pain;Safety PT Transfers Functional Problem(s): Bed Mobility;Bed to Chair;Car;Furniture PT Locomotion Functional Problem(s): Wheelchair Mobility;Stairs;Ambulation PT Plan PT Intensity: Minimum of 1-2 x/day ,45 to 90 minutes PT Frequency: 5 out of 7 days PT Duration Estimated Length of Stay: 10-12 PT Treatment/Interventions: Ambulation/gait training;Balance/vestibular training;Community reintegration;Discharge planning;Disease management/prevention;Functional mobility training;DME/adaptive equipment instruction;Neuromuscular re-education;Pain management;Patient/family  education;Splinting/orthotics;Skin care/wound management;Psychosocial support;Stair training;Therapeutic Activities;Therapeutic Exercise;Visual/perceptual remediation/compensation;UE/LE Coordination activities;UE/LE Strength taining/ROM;Wheelchair propulsion/positioning PT Transfers Anticipated Outcome(s): Mod I with LRAD  PT Locomotion Anticipated Outcome(s): Ambulatory for household distance Mod I  PT Recommendation Follow Up Recommendations: Home health PT Patient destination: Home Equipment Recommended: Rolling walker with 5" wheels;Wheelchair cushion (measurements);Wheelchair (measurements)  Skilled Therapeutic Intervention Pt received sitting in [facility] and agreeable to PT. PT instructed patient in PT Evaluation and initiated treatment intervention; see below for results. PT educated patient in [place], rehab potential, rehab goals, and discharge recommendations. Pt returned to room and performed stand pivot transfer to bed with min assist. Sit>supine completed with min assist and moderate cues from PT for use of bed features, pt left supine in bed with call bell in reach and all needs met.      PT Evaluation Precautions/Restrictions Precautions Precautions: Fall Precaution Comments: monitor O2 sats, desatted on Room Air Restrictions Weight Bearing Restrictions: No General   Vital SignsTherapy Vitals Temp: 97.9 F (36.6 C) Temp Source: Oral Pulse Rate: 76 Resp: 20 BP: 91/61 Patient Position (if appropriate): Sitting Oxygen Therapy SpO2: 96 % O2 Device: Nasal Cannula O2 Flow Rate (L/min): 2 L/min Pain Pain Assessment Pain Scale: 0-10 Pain Score: 7  Pain Type: Acute pain Pain Location: Knee Pain Orientation: Right Pain Descriptors / Indicators: Throbbing Pain Intervention(s): Repositioned;Emotional support Home Living/Prior Functioning Home Living Living Arrangements: Alone Available Help at Discharge: Family;Available PRN/intermittently Type of Home: Apartment Home Access:  Level entry Home Layout: One level Bathroom Shower/Tub: Chiropodist:  Standard Bathroom Accessibility: Yes  Lives With: Daughter Prior Function Level of Independence: Independent with basic ADLs(MOD I wiht ROllator) Comments: Rolling walker for amb; drives, cares fo rgrandson Vision/Perception  Perception Perception: Within Functional Limits Praxis Praxis: Intact  Cognition Overall Cognitive Status: Within Functional Limits for tasks assessed Attention: Selective Selective Attention: Appears intact Memory: Appears intact Awareness: Appears intact Problem Solving: Appears intact Safety/Judgment: Appears intact Sensation Sensation Proprioception: Appears Intact Coordination Gross Motor Movements are Fluid and Coordinated: Yes Fine Motor Movements are Fluid and Coordinated: No Motor  Motor Motor: Within Functional Limits  Mobility Transfers Sit to Stand: 3: Mod assist Sit to Stand Details: Verbal cues for safe use of DME/AE;Verbal cues for precautions/safety Stand to Sit: 3: Mod assist Stand to Sit Details (indicate cue type and reason): Verbal cues for safe use of DME/AE;Verbal cues for precautions/safety Locomotion  Ambulation Ambulation: Yes Ambulation/Gait Assistance: 4: Min assist Ambulation Distance (Feet): 20 Feet Assistive device: Rolling walker Ambulation/Gait Assistance Details: Verbal cues for gait pattern;Verbal cues for safe use of DME/AE Gait Gait: Yes Gait Pattern: Antalgic Stairs / Additional Locomotion Stairs: No Wheelchair Mobility Wheelchair Mobility: Yes Wheelchair Assistance: 4: Min Tour manager: Both upper extremities Distance: 15  Trunk/Postural Assessment  Cervical Assessment Cervical Assessment: Exceptions to WFL(head forward) Thoracic Assessment Thoracic Assessment: Exceptions to WFL(rounded shoulders) Lumbar Assessment Lumbar Assessment: Within Functional Limits Postural Control Postural Control:  Within Functional Limits  Balance Balance Balance Assessed: Yes Dynamic Sitting Balance Dynamic Sitting - Level of Assistance: 5: Stand by assistance Static Standing Balance Static Standing - Balance Support: Left upper extremity supported Static Standing - Level of Assistance: 4: Min assist Dynamic Standing Balance Dynamic Standing - Balance Support: Bilateral upper extremity supported Dynamic Standing - Level of Assistance: 4: Min assist Extremity Assessment  RUE Assessment RUE Assessment: Exceptions to WFL(generalized weakness) LUE Assessment LUE Assessment: Exceptions to WFL(generalized weakness) RLE Assessment RLE Assessment: Exceptions to [facility] RLE Strength RLE Overall Strength Comments: grossly 4/5 proximal to distal with pain in knee flexion/extension testing.  LLE Assessment LLE Assessment: Within Functional Limits   See Function Navigator for Current Functional Status.   Refer to Care Plan for Long Term Goals  Recommendations for other services: Therapeutic Recreation  Kitchen group and Stress management  Discharge Criteria: Patient will be discharged from PT if patient refuses treatment 3 consecutive times without medical reason, if treatment goals not met, if there is a change in medical status, if patient makes no progress towards goals or if patient is discharged from hospital.  The above assessment, treatment plan, treatment alternatives and goals were discussed and mutually agreed upon: by patient  Lorie Phenix 08/10/2017, 3:16 PM

## 2017-08-11 ENCOUNTER — Inpatient Hospital Stay (HOSPITAL_COMMUNITY): Payer: Medicaid Other | Admitting: Physical Therapy

## 2017-08-11 ENCOUNTER — Inpatient Hospital Stay (HOSPITAL_COMMUNITY): Payer: Medicaid Other

## 2017-08-11 DIAGNOSIS — J9611 Chronic respiratory failure with hypoxia: Secondary | ICD-10-CM

## 2017-08-11 DIAGNOSIS — Z9981 Dependence on supplemental oxygen: Secondary | ICD-10-CM

## 2017-08-11 DIAGNOSIS — D638 Anemia in other chronic diseases classified elsewhere: Secondary | ICD-10-CM

## 2017-08-11 MED ORDER — VITAMIN B-12 1000 MCG PO TABS
1000.0000 ug | ORAL_TABLET | Freq: Every day | ORAL | Status: DC
  Administered 2017-08-11 – 2017-08-22 (×12): 1000 ug via ORAL
  Filled 2017-08-11 (×12): qty 1

## 2017-08-11 NOTE — Progress Notes (Signed)
[place] PHYSICAL MEDICINE & REHABILITATION     PROGRESS NOTE  Subjective/Complaints:  Inside of nose burns asking to reduce O2 rate, currently 2l [place]  ROS: Denies CP, SOB, N/V/D  Objective: Vital Signs: Blood pressure 90/66, pulse 77, temperature 97.8 F (36.6 C), temperature source Oral, resp. rate 18, height 4\' 9"  (1.448 m), weight (!) 155.2 kg (342 lb 2.5 oz), SpO2 91 %. No results found. Recent Labs    08/09/17 0854 08/10/17 0600  WBC 6.6 6.8  HGB 8.9* 9.2*  HCT 33.8* 35.4*  PLT 598* 588*   Recent Labs    08/09/17 0854 08/10/17 0600  NA 138 139  K 4.2 4.2  CL 97* 95*  GLUCOSE 127* 96  BUN 5* 12  CREATININE 0.49 0.60  CALCIUM 8.6* 8.4*   CBG (last 3)  No results for input(s): GLUCAP in the last 72 hours.  Wt Readings from Last 3 Encounters:  08/11/17 (!) 155.2 kg (342 lb 2.5 oz)  08/09/17 (!) 159 kg (350 lb 8.5 oz)  11/02/15 (!) 167.4 kg (369 lb)    Physical Exam:  BP 90/66   Pulse 77   Temp 97.8 F (36.6 C) (Oral)   Resp 18   Ht 4\' 9"  (1.448 m)   Wt (!) 155.2 kg (342 lb 2.5 oz)   SpO2 91%   BMI 74.04 kg/m  Constitutional: NAD. Obese.  HENT: Normocephalic. Atraumatic. Eyes:EOMare normal. No discharge.  Cardiovascular:Normal rate,regular rhythmand no JVD. Respiratory:Effort normaland breath sounds normal. +[place]. GI: Bowel sounds are normal. She exhibitsno distension.  Musculoskeletal:Right knee with mild swelling Lymphedema b/l LE Neurological: She isalertand oriented.  Motor: 5/5 bilateral deltoid bicep tricep grip  3/5 hip flexion, knee extension, 4+/5 at the ankle dorsiflexors  Skin: Skin iswarmand dry. Intact. Psych: Normal mood and behavior.   Assessment/Plan: 1. Functional deficits secondary to debility which require 3+ hours per day of interdisciplinary therapy in a comprehensive inpatient rehab setting. Physiatrist is providing close team supervision and 24 hour management of active medical problems listed  below. Physiatrist and rehab team continue to assess barriers to discharge/monitor patient progress toward functional and medical goals.  Function:  Bathing Bathing position      Bathing parts Body parts bathed by patient: Right arm, Left arm, Chest, Abdomen, Front perineal area, Right upper leg, Left upper leg Body parts bathed by helper: Buttocks, Right lower leg, Left lower leg, Back  Bathing assist Assist Level: Touching or steadying assistance(Pt > 75%)      Upper Body Dressing/Undressing Upper body dressing   What is the patient wearing?: Bra, Pull over shirt/dress Bra - Perfomed by patient: Thread/unthread right bra strap, Hook/unhook bra (pull down sports bra) Bra - Perfomed by helper: Thread/unthread left bra strap Pull over shirt/dress - Perfomed by patient: Thread/unthread right sleeve, Thread/unthread left sleeve, Put head through opening, Pull shirt over trunk          Upper body assist Assist Level: Touching or steadying assistance(Pt > 75%)      Lower Body Dressing/Undressing Lower body dressing   What is the patient wearing?: Socks(skirt)             Socks - Performed by patient: Don/doff right sock, Don/doff left sock                Lower body assist Assist for lower body dressing: Supervision or verbal cues      Toileting Toileting          Toileting assist  Transfers Chair/bed transfer   Chair/bed transfer method: Stand pivot Chair/bed transfer assist level: Touching or steadying assistance (Pt > 75%) Chair/bed transfer assistive device: Armrests     Locomotion Ambulation     Max distance: 55ft  Assist level: Touching or steadying assistance (Pt > 75%)   Wheelchair   Type: Manual Max wheelchair distance: 7ft Assist Level: Supervision or verbal cues  Cognition Comprehension    Expression    Social Interaction    Problem Solving    Memory      Medical Problem List and Plan: 1.Debilitysecondary to acute on chronic  disystolic heart failure with underlying chronic respiratory failure  cont CIR PT, OT 2. DVT Prophylaxis/Anticoagulation: Lovenox. Monitor for any bleeding episodes 3. Pain Management:Voltaren gel 3 times daily, Mobic 15 mg daily, hydrocodone as needed 4. Mood:Provide emotional support 5. Neuropsych: This patientiscapable of making decisions on herown behalf. 6. Skin/Wound Care:Routine skin checks 7. Fluids/Electrolytes/Nutrition:Routine I/O's   BMP within acceptable range on 4/19 8. Acute on chronic diastolic CHF: Weight daily. Heart healthy diet.Demadex 20 as directed. Monitor for any signs of fluid overload Filed Weights   08/09/17 1645 08/10/17 0543 08/11/17 0500  Weight: (!) 156.9 kg (346 lb) (!) 155.5 kg (342 lb 12.8 oz) (!) 155.2 kg (342 lb 2.5 oz)  9. Severe pulmonary HTN: with hypoxia:Monitor with increased mobility 10 LLL HWE:[id number] course of Omnicef, will clarify date. 11. Anemia of chronic disease with B12 and iron deficiency:    Has been non-complianat with iron/B 12 injections. Monitor with serial checks. Educate on importance of medical compliance.    Continue iron supplement TID ,Change B12 injections to po   Hb 9.2 on 4/19   Cont to monitor 12. Severe right knee OA: Continue Mobic ? as well as local measures with ice. 13. 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 Laxative assistance 15. Supplemental oxygen dependent   Was using 1.5L PRN, which started 1 week PTA  Will wean as tolerated used prn at home based on pulse ox reading Add humidified O2 due to nasal irritation  LOS (Days) 2 A FACE TO FACE EVALUATION WAS PERFORMED  Erick Colace 08/11/2017 7:47 AM

## 2017-08-11 NOTE — Progress Notes (Signed)
Physical Therapy Session Note  Patient Details  Name: Dana Abbott MRN: [id number] Date of Birth: 1965-10-23  Today's Date: 08/11/2017 PT Individual Time: 1000-1100 PT Individual Time Calculation (min): 60 min   Short Term Goals: Week 1:  PT Short Term Goal 1 (Week 1): Pt will perform bed mobility with supervision assist consistently  PT Short Term Goal 2 (Week 1): Pt will ambulate 6ft with LRAD and supervision assist  PT Short Term Goal 3 (Week 1): Pt will propell WC 82ft with and supervision assist  PT Short Term Goal 4 (Week 1): Pt will trasnfer to and from [facility] with supervision assist.   Skilled Therapeutic Interventions/Progress Updates:    Pt seated in w/c in room, agreeable to PT. Pt reports 9/10 pain in R knee this AM due to injuring it when she fell a few weeks ago. Seated R knee quad and hamstring stretch 3 x 30 sec each for pt comfort. Ambulation 2 x 25 ft with 4WW and min assist, flexed trunk and B knee flexion with gait. Pt on 2L O2 at rest, SpO2 93%, drops to 81% with activity. Titrated O2 to 3L, SpO2 90% and above with increase in O2 and pursed lip breathing techniques. Seated BLE therex: marches, LAQ, heel/toe raises. Pt requested to sit in recliner in room upon return, attempted to have pt sit on cushion to increase chair height but pt reports it is not comfortable. Removed cushion and pt left seated in recliner with NT present to assist pt with meeting further needs.  Therapy Documentation Precautions:  Precautions Precautions: Fall Precaution Comments: monitor O2 sats, desatted on Room Air Restrictions Weight Bearing Restrictions: No  See Function Navigator for Current Functional Status.   Therapy/Group: Individual Therapy  Peter Congo, PT, DPT  08/11/2017, 3:59 PM

## 2017-08-11 NOTE — Progress Notes (Signed)
Occupational Therapy Session Note  Patient Details  Name: Dana Abbott MRN: [id number] Date of Birth: 16-Feb-1966  Today's Date: 08/11/2017 OT Individual Time: 0865-7846 OT Individual Time Calculation (min): 58 min    Short Term Goals: Week 1:  OT Short Term Goal 1 (Week 1): Pt will bathe UB maintaining O2 sats greater than 90% OT Short Term Goal 2 (Week 1): Pt will sit to stand wiht MIN A OT Short Term Goal 3 (Week 1): Pt will transfer to [facility] wiht touching A and LRAD OT Short Term Goal 4 (Week 1): Pt will bathe UB wiht touching A and AE PRN  Skilled Therapeutic Interventions/Progress Updates:    1:1. OT selects standard cushion and 26" width bariatric w/c for improved fit of pt body habitus. Pt dons socks at bed level with increased time in long sitting. Pt completes supine>sitting EOB with supervision and use of bed rails. Pt sit to stand with steady A and ambulates to w/c in front of seat with VC for upright posture as pt leans on foarearm. D/t pt stature pt requires use of BLE to help boost and reciprocally scoot to back of chair. Pt bathes at sink with set up for UB and leans forward to wash BLE. OT provides A to wash B feet, back and declines washing buttocks. Pt dons pull over shirt and skirt with set up. Pt threads BLE into underewear and sit to stand with sink to advance underwear past hips with min guard for standing balance and VC for hand placement instead of completely pulling on sink . Pt requires Vc for pursed lip breathing during functional tasks, however pt  Reports no SOB but still breathing heavy. Pt completes oral care at sink with setup seated in w/c.  Therapy Documentation Precautions:  Precautions Precautions: Fall Precaution Comments: monitor O2 sats, desatted on Room Air Restrictions Weight Bearing Restrictions: No  See Function Navigator for Current Functional Status.   Therapy/Group: Individual Therapy  Shon Hale 08/11/2017, 8:37 AM

## 2017-08-11 NOTE — Progress Notes (Signed)
Occupational Therapy Session Note  Patient Details  Name: Dana Abbott MRN: [id number] Date of Birth: 1966/01/03  Today's Date: 08/11/2017 OT Individual Time: 7142-3200 OT Individual Time Calculation (min): 70 min    Short Term Goals: Week 1:  OT Short Term Goal 1 (Week 1): Pt will bathe UB maintaining O2 sats greater than 90% OT Short Term Goal 2 (Week 1): Pt will sit to stand wiht MIN A OT Short Term Goal 3 (Week 1): Pt will transfer to [facility] wiht touching A and LRAD OT Short Term Goal 4 (Week 1): Pt will bathe UB wiht touching A and AE PRN  Skilled Therapeutic Interventions/Progress Updates:    1:1. Pt sitting in bed upon arrival with no c/o pain, however reporting frustration with toileting experience with nursing as pt unable to get far enough back on commode and ended up wetting herself. Pt becomes tearful and requires encouragement to assist OT inproblem solving toileting this session. Pt requires extensive, delicate explanation on need for BSC over toilet d/t body habitus/weight making it unsafe to sit directly on toilet. OT spends significant time adjusting bariatric BSC over toilet to appropriately fit patient's stature but not be too low so pt cannot stand up. Pt ambulates with rollator with min VC for locking brakes EOB>BSC>w/c>bed throughout session with CGA fading to supervision and completes 3/3 steps of toileting with supervision. Pt completes 10 min on arm bike 5 min forward/5 backward on level 1 to improve endurance/BUE strength required for BADLs and arm strength with pt discussing with OT routines at home/IADL engagement. OT able to ascertain information for pt to be able to 2 ADL/IADL tasks in next session for a performance analysis of motor and process skills. Exited session with pt seated in bed, call light in reach and all needs met.   Therapy Documentation Precautions:  Precautions Precautions: Fall Precaution Comments: monitor O2 sats, desatted on Room  Air Restrictions Weight Bearing Restrictions: No  See Function Navigator for Current Functional Status.   Therapy/Group: Individual Therapy  Tonny Branch 08/11/2017, 4:48 PM

## 2017-08-11 NOTE — Plan of Care (Signed)
Pt using purewick

## 2017-08-12 ENCOUNTER — Inpatient Hospital Stay (HOSPITAL_COMMUNITY): Payer: Medicaid Other

## 2017-08-12 NOTE — Progress Notes (Signed)
Occupational Therapy Session Note  Patient Details  Name: Dana Abbott MRN: [id number] Date of Birth: 1965-06-18  Today's Date: 08/12/2017 OT Individual Time: 0900-1003 OT Individual Time Calculation (min): 63 min    Short Term Goals: Week 1:  OT Short Term Goal 1 (Week 1): Pt will bathe UB maintaining O2 sats greater than 90% OT Short Term Goal 2 (Week 1): Pt will sit to stand wiht MIN A OT Short Term Goal 3 (Week 1): Pt will transfer to [facility] wiht touching A and LRAD OT Short Term Goal 4 (Week 1): Pt will bathe UB wiht touching A and AE PRN  Skilled Therapeutic Interventions/Progress Updates:    1;1. Pt agreeable to tx session. Pt participating in performance analysis of 2 tasks: grooming/dressing at sink and making coffee/toast. Pt demo the following diminished motor and process skills based on performance of tasks: reaching, endures, bends, paces, chooses, continues, inquires, gathers, organizes and restores. Pt at overall supervision level for seated grooming and donning pants/shirt, however requires total A to don socks and shoes seated at sink. Pt completes kitchen ativity with supervision level, however requires use of counter to pull up into standing to prop on and requires A to reposition/propel w/c. Exited session with pt seated in w/c with call light in reach and all need smet.  Therapy Documentation Precautions:  Precautions Precautions: Fall Precaution Comments: monitor O2 sats, desatted on Room Air Restrictions Weight Bearing Restrictions: No General:   Vital Signs:   Pain:   ADL:   Vision   Perception    Praxis   Exercises:   Other Treatments:    See Function Navigator for Current Functional Status.   Therapy/Group: Individual Therapy  Shon Hale 08/12/2017, 10:10 AM

## 2017-08-12 NOTE — Plan of Care (Signed)
  Problem: Consults Goal: RH GENERAL PATIENT EDUCATION Description See Patient Education module for education specifics. 08/12/2017 1139 by Yolonda Kida, RN Outcome: Progressing 08/12/2017 1138 by Yolonda Kida, RN Outcome: Progressing   Problem: RH BLADDER ELIMINATION Goal: RH STG MANAGE BLADDER WITH ASSISTANCE Description STG Manage Bladder With Min Assistance  08/12/2017 1139 by Yolonda Kida, RN Outcome: Progressing 08/12/2017 1138 by Yolonda Kida, RN Outcome: Progressing   Problem: RH SKIN INTEGRITY Goal: RH STG SKIN FREE OF INFECTION/BREAKDOWN Description No new breakdown with Min assist.  08/12/2017 1139 by Yolonda Kida, RN Outcome: Progressing 08/12/2017 1138 by Yolonda Kida, RN Outcome: Progressing   Problem: RH PAIN MANAGEMENT Goal: RH STG PAIN MANAGED AT OR BELOW PT'S PAIN GOAL Description Less than 3 out of 10.  08/12/2017 1139 by Yolonda Kida, RN Outcome: Progressing 08/12/2017 1138 by Yolonda Kida, RN Outcome: Progressing   Problem: RH BOWEL ELIMINATION Goal: RH STG MANAGE BOWEL W/MEDICATION W/ASSISTANCE Description STG Manage Bowel with Medication with mod I Assistance.  Outcome: Not Progressing

## 2017-08-12 NOTE — Progress Notes (Signed)
[place] PHYSICAL MEDICINE & REHABILITATION     PROGRESS NOTE  Subjective/Complaints:  Nose feels better with humidified air  "why am I listed as diabetic?"  MD Not in problem list or PMH, elevated gluc 4/18 but this may not have been fasting ~9a  ROS: Denies CP, SOB, N/V/D  Objective: Vital Signs: Blood pressure 99/62, pulse 76, temperature 97.9 F (36.6 C), temperature source Oral, resp. rate 16, height 4\' 9"  (1.448 m), weight (!) 161 kg (354 lb 15.1 oz), SpO2 95 %. No results found. Recent Labs    08/09/17 0854 08/10/17 0600  WBC 6.6 6.8  HGB 8.9* 9.2*  HCT 33.8* 35.4*  PLT 598* 588*   Recent Labs    08/09/17 0854 08/10/17 0600  NA 138 139  K 4.2 4.2  CL 97* 95*  GLUCOSE 127* 96  BUN 5* 12  CREATININE 0.49 0.60  CALCIUM 8.6* 8.4*   CBG (last 3)  No results for input(s): GLUCAP in the last 72 hours.  Wt Readings from Last 3 Encounters:  08/12/17 (!) 161 kg (354 lb 15.1 oz)  08/09/17 (!) 159 kg (350 lb 8.5 oz)  11/02/15 (!) 167.4 kg (369 lb)    Physical Exam:  BP 99/62 (BP Location: Left Arm)   Pulse 76   Temp 97.9 F (36.6 C) (Oral)   Resp 16   Ht 4\' 9"  (1.448 m)   Wt (!) 161 kg (354 lb 15.1 oz)   SpO2 95%   BMI 76.81 kg/m  Constitutional: NAD. Obese.  HENT: Normocephalic. Atraumatic. Eyes:EOMare normal. No discharge.  Cardiovascular:Normal rate,regular rhythmand no JVD. Respiratory:Effort normaland breath sounds normal. +[place]. GI: Bowel sounds are normal. She exhibitsno distension.  Musculoskeletal:Right knee with mild swelling Lymphedema b/l LE Neurological: She isalertand oriented.  Motor: 5/5 bilateral deltoid bicep tricep grip  3/5 hip flexion, knee extension, 4+/5 at the ankle dorsiflexors  Skin: Skin iswarmand dry. Intact. Psych: Normal mood and behavior.   Assessment/Plan: 1. Functional deficits secondary to debility which require 3+ hours per day of interdisciplinary therapy in a comprehensive inpatient rehab  setting. Physiatrist is providing close team supervision and 24 hour management of active medical problems listed below. Physiatrist and rehab team continue to assess barriers to discharge/monitor patient progress toward functional and medical goals.  Function:  Bathing Bathing position      Bathing parts Body parts bathed by patient: Right arm, Left arm, Chest, Abdomen, Front perineal area, Right upper leg, Left upper leg Body parts bathed by helper: Buttocks, Right lower leg, Left lower leg, Back  Bathing assist Assist Level: Touching or steadying assistance(Pt > 75%)      Upper Body Dressing/Undressing Upper body dressing   What is the patient wearing?: Bra, Pull over shirt/dress Bra - Perfomed by patient: Thread/unthread right bra strap, Hook/unhook bra (pull down sports bra) Bra - Perfomed by helper: Thread/unthread left bra strap Pull over shirt/dress - Perfomed by patient: Thread/unthread right sleeve, Thread/unthread left sleeve, Put head through opening, Pull shirt over trunk          Upper body assist Assist Level: Touching or steadying assistance(Pt > 75%)      Lower Body Dressing/Undressing Lower body dressing   What is the patient wearing?: Socks(skirt)             Socks - Performed by patient: Don/doff right sock, Don/doff left sock                Lower body assist Assist for lower body dressing:  Supervision or verbal cues      Toileting Toileting   Toileting steps completed by patient: Adjust clothing prior to toileting, Adjust clothing after toileting Toileting steps completed by helper: Performs perineal hygiene Toileting Assistive Devices: Grab bar or rail  Toileting assist Assist level: Touching or steadying assistance (Pt.75%)   Transfers Chair/bed transfer   Chair/bed transfer method: Stand pivot Chair/bed transfer assist level: Touching or steadying assistance (Pt > 75%) Chair/bed transfer assistive device: Armrests, Scientific laboratory technician     Max distance: 25' Assist level: Touching or steadying assistance (Pt > 75%)   Wheelchair   Type: Manual Max wheelchair distance: 38ft Assist Level: Supervision or verbal cues  Cognition Comprehension Comprehension assist level: Follows complex conversation/direction with no assist  Expression Expression assist level: Expresses complex ideas: With no assist  Social Interaction Social Interaction assist level: Interacts appropriately with others - No medications needed.  Problem Solving Problem solving assist level: Solves complex problems: Recognizes & self-corrects  Memory Memory assist level: Complete Independence: No helper    Medical Problem List and Plan: 1.Debilitysecondary to acute on chronic disystolic heart failure with underlying chronic respiratory failure  cont CIR PT, OT 2. DVT Prophylaxis/Anticoagulation: Lovenox. Monitor for any bleeding episodes 3. Pain Management:Voltaren gel 3 times daily, Mobic 15 mg daily, hydrocodone as needed 4. Mood:Provide emotional support 5. Neuropsych: This patientiscapable of making decisions on herown behalf. 6. Skin/Wound Care:Routine skin checks 7. Fluids/Electrolytes/Nutrition:Routine I/O's   BMP within acceptable range on 4/19 8. Acute on chronic diastolic CHF: Weight daily. Heart healthy diet.Demadex 20 as directed. Monitor for any signs of fluid overload Filed Weights   08/10/17 0543 08/11/17 0500 08/12/17 0512  Weight: (!) 155.5 kg (342 lb 12.8 oz) (!) 155.2 kg (342 lb 2.5 oz) (!) 161 kg (354 lb 15.1 oz)  9. Severe pulmonary HTN: with hypoxia:Monitor with increased mobility 10 LLL OIL:[id number] course of Omnicef, will clarify date. 11. Anemia of chronic disease with B12 and iron deficiency:    Has been non-complianat with iron/B 12 injections. Monitor with serial checks. Educate on importance of medical compliance.    Continue iron supplement TID ,Change B12 injections to po   Hb  9.2 on 4/19   Cont to monitor 12. Severe right knee OA: Continue Mobic ? as well as local measures with ice. 13. 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 Laxative assistance 15. Supplemental oxygen dependent   Was using 1.5L PRN, which started 1 week PTA  Will wean as tolerated used prn at home based on pulse ox reading Better with  humidified O2 due to nasal irritation  LOS (Days) 3 A FACE TO FACE EVALUATION WAS PERFORMED  Erick Colace 08/12/2017 7:28 AM

## 2017-08-13 ENCOUNTER — Inpatient Hospital Stay (HOSPITAL_COMMUNITY): Payer: Medicaid Other | Admitting: Occupational Therapy

## 2017-08-13 ENCOUNTER — Inpatient Hospital Stay (HOSPITAL_COMMUNITY): Payer: Medicaid Other | Admitting: Physical Therapy

## 2017-08-13 ENCOUNTER — Inpatient Hospital Stay (HOSPITAL_COMMUNITY): Payer: Medicaid Other

## 2017-08-13 NOTE — Plan of Care (Signed)
LBM 08/08/17 and pt. Refusing PRN.

## 2017-08-13 NOTE — Progress Notes (Signed)
[facility] Individual Statement of Services  Patient Name:  Dana Abbott  Date:  08/13/2017  Welcome to the [facility]patient [facility].  Our goal is to provide you with an individualized program based on your diagnosis and situation, designed to meet your specific needs.  With this comprehensive rehabilitation program, you will be expected to participate in at least 3 hours of rehabilitation therapies Monday-Friday, with modified therapy programming on the weekends.  Your rehabilitation program will include the following services:  Physical Therapy (PT), Occupational Therapy (OT), 24 hour per day rehabilitation nursing, Case Management (Social Worker), Rehabilitation Medicine, Nutrition Services and Pharmacy Services  Weekly team conferences will be held on Wednesdays to discuss your progress.  Your Social Worker will talk with you frequently to get your input and to update you on team discussions.  Team conferences with you and your family in attendance may also be held.  Expected length of stay:  10 to 14 days  Overall anticipated outcome:  Modified independent with supervision needed for shower transfers, car transfers, stairs, ambulating outside of home  Depending on your progress and recovery, your program may change. Your Social Worker will coordinate services and will keep you informed of any changes. Your Social Worker's name and contact numbers are listed  below.  The following services may also be recommended but are not provided by the Inpatient Rehabilitation Center:   Driving Evaluations  Home Health Rehabiltiation Services  Outpatient Rehabilitation Services   Arrangements will be made to provide these services after discharge if needed.  Arrangements include referral to agencies that provide these services.  Your insurance has been verified to be:  Medicaid Your primary doctor is:  Geanie Logan, PA-C at [facility]  Pertinent  information will be shared with your doctor and your insurance company.  Social Worker:  Staci Acosta, LCSW  [phone] or ([phone]  Information discussed with and copy given to patient by: Elvera Lennox, 08/13/2017, 9:17 AM

## 2017-08-13 NOTE — Plan of Care (Signed)
No BM since 4/17; refusing laxatives

## 2017-08-13 NOTE — Progress Notes (Signed)
[place] PHYSICAL MEDICINE & REHABILITATION     PROGRESS NOTE  Subjective/Complaints:  Up at EOB. Slight desaturation last night. On 2L oxgyen this am.  ROS: Patient denies fever, rash, sore throat, blurred vision, nausea, vomiting, diarrhea, cough, shortness of breath or chest pain, joint or back pain, headache, or mood change.   Objective: Vital Signs: Blood pressure 113/67, pulse 73, temperature 97.6 F (36.4 C), temperature source Oral, resp. rate 16, height 4\' 9"  (1.448 m), weight (!) 160.2 kg (353 lb 2.8 oz), SpO2 96 %. No results found. No results for input(s): WBC, HGB, HCT, PLT in the last 72 hours. No results for input(s): NA, K, CL, GLUCOSE, BUN, CREATININE, CALCIUM in the last 72 hours.  Invalid input(s): CO CBG (last 3)  No results for input(s): GLUCAP in the last 72 hours.  Wt Readings from Last 3 Encounters:  08/13/17 (!) 160.2 kg (353 lb 2.8 oz)  08/09/17 (!) 159 kg (350 lb 8.5 oz)  11/02/15 (!) 167.4 kg (369 lb)    Physical Exam:  BP 113/67 (BP Location: Left Arm)   Pulse 73   Temp 97.6 F (36.4 C) (Oral)   Resp 16   Ht 4\' 9"  (1.448 m)   Wt (!) 160.2 kg (353 lb 2.8 oz)   SpO2 96%   BMI 76.43 kg/m  Constitutional: No distress . Vital signs reviewed. HEENT: EOMI, oral membranes moist Cardiovascular: RRR without murmur. No JVD    Respiratory: CTA Bilaterally without wheezes or rales. Normal effort , O2 [place] 2L GI: BS +, non-tender, non-distended  Musculoskeletal:Right knee with mild swellingpresent Lymphedema b/l LE Neurological: She isalertand oriented.  Motor: 5/5 bilateral deltoid bicep tricep grip  3/5 hip flexion, knee extension, 4+/5 at the ankle dorsiflexors. Good sitting balance Skin: Skin iswarmand dry. Intact. Psych: pleasant and cooperative.   Assessment/Plan: 1. Functional deficits secondary to debility which require 3+ hours per day of interdisciplinary therapy in a comprehensive inpatient rehab setting. Physiatrist is providing  close team supervision and 24 hour management of active medical problems listed below. Physiatrist and rehab team continue to assess barriers to discharge/monitor patient progress toward functional and medical goals.  Function:  Bathing Bathing position      Bathing parts Body parts bathed by patient: Right arm, Left arm, Chest, Abdomen, Front perineal area, Right upper leg, Left upper leg Body parts bathed by helper: Buttocks, Right lower leg, Left lower leg, Back  Bathing assist Assist Level: Touching or steadying assistance(Pt > 75%)      Upper Body Dressing/Undressing Upper body dressing   What is the patient wearing?: Bra, Pull over shirt/dress Bra - Perfomed by patient: Thread/unthread right bra strap, Hook/unhook bra (pull down sports bra) Bra - Perfomed by helper: Thread/unthread left bra strap Pull over shirt/dress - Perfomed by patient: Thread/unthread right sleeve, Thread/unthread left sleeve, Put head through opening, Pull shirt over trunk          Upper body assist Assist Level: Touching or steadying assistance(Pt > 75%)      Lower Body Dressing/Undressing Lower body dressing   What is the patient wearing?: Socks(skirt)             Socks - Performed by patient: Don/doff right sock, Don/doff left sock                Lower body assist Assist for lower body dressing: Supervision or verbal cues      Toileting Toileting   Toileting steps completed by patient: Adjust clothing prior  to toileting Toileting steps completed by helper: Performs perineal hygiene, Adjust clothing after toileting Toileting Assistive Devices: Grab bar or rail  Toileting assist Assist level: Touching or steadying assistance (Pt.75%)   Transfers Chair/bed transfer   Chair/bed transfer method: Stand pivot Chair/bed transfer assist level: Touching or steadying assistance (Pt > 75%) Chair/bed transfer assistive device: Armrests, Patent attorney     Max  distance: 25' Assist level: Touching or steadying assistance (Pt > 75%)   Wheelchair   Type: Manual Max wheelchair distance: 58ft Assist Level: Supervision or verbal cues  Cognition Comprehension Comprehension assist level: Follows complex conversation/direction with no assist  Expression Expression assist level: Expresses complex ideas: With no assist  Social Interaction Social Interaction assist level: Interacts appropriately with others - No medications needed.  Problem Solving Problem solving assist level: Solves complex problems: Recognizes & self-corrects  Memory Memory assist level: Complete Independence: No helper    Medical Problem List and Plan: 1.Debilitysecondary to acute on chronic disystolic heart failure with underlying chronic respiratory failure  cont CIR PT, OT 2. DVT Prophylaxis/Anticoagulation: Lovenox. Monitor for any bleeding episodes 3. Pain Management:Voltaren gel 3 times daily, Mobic 15 mg daily, hydrocodone as needed 4. Mood:Provide emotional support 5. Neuropsych: This patientiscapable of making decisions on herown behalf. 6. Skin/Wound Care:Routine skin checks 7. Fluids/Electrolytes/Nutrition:Routine I/O's   BMP within acceptable range on 4/19 8. Acute on chronic diastolic CHF: Weight daily. Heart healthy diet.Demadex 20 as directed.   -weight up from Friday. Follow for consistent pattern, I's and O's negative Filed Weights   08/11/17 0500 08/12/17 0512 08/13/17 0415  Weight: (!) 155.2 kg (342 lb 2.5 oz) (!) 161 kg (354 lb 15.1 oz) (!) 160.2 kg (353 lb 2.8 oz)  9. Severe pulmonary HTN: with hypoxia:Monitor with increased mobility 10 LLL YQM:VHQIONGE course of Omnicef, will clarify date. 11. Anemia of chronic disease with B12 and iron deficiency:    Has been non-complianat with iron/B 12 injections. Monitor with serial checks. Educate on importance of medical compliance.    Continue iron supplement TID ,Change B12 injections to po    Hb 9.2 on 4/19   Cont to monitor 12. Severe right knee OA: Continue Mobic, ice 13. 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 Laxative assistance 15. Supplemental oxygen dependent   Was using 1.5L PRN, which started 1 week PTA  Wean as possible. Discussed with patient, on 2L this morning  LOS (Days) 4 A FACE TO FACE EVALUATION WAS PERFORMED  Ranelle Oyster 08/13/2017 9:04 AM

## 2017-08-13 NOTE — Progress Notes (Signed)
Physical Therapy Session Note  Patient Details  Name: Dana Abbott MRN: [id number] Date of Birth: 07-May-1965  Today's Date: 08/13/2017 PT Individual Time: 1000-1105 PT Individual Time Calculation (min): 65 min   Short Term Goals: Week 1:  PT Short Term Goal 1 (Week 1): Pt will perform bed mobility with supervision assist consistently  PT Short Term Goal 2 (Week 1): Pt will ambulate 56f with LRAD and supervision assist  PT Short Term Goal 3 (Week 1): Pt will propell WC 38fwith and supervision assist  PT Short Term Goal 4 (Week 1): [facility]   Skilled Therapeutic Interventions/Progress Updates:  Pt presented sitting EOB agreeable to therapy. Pt donned [place] and performed sit to stand with rollator min guard and ambulated to w/c outside room. Pt unable to fully extend RLE in stance position due to pain. Pt transported to rehab gym and participatedin RLE including ROM flexion/ext, AA extension with use of roller board. PTA performed manual hamstring and heel cord stretch 30sec x 3 with pt indicating increased R knee pain anteriorly. PTA performed gentle a-p distraction to R knee to pt's tolerance with fair results. Participated in standing tolerance, pt performed sit to stand from w/c with min guard and increased time. Pt able to maintain standing up to 1 min however pt unable to fully extend RLE noted by r foot unable to touch ground. Pt indicating increasing pain in R knee up to 10/10 with nsg notified. Pt transported to day room and participated in UBE L 2 total 10 min 5 min forward, 5 min backwards for endurance. Pt transported back to room and performed stand pivot transfer to bed from w/c with min guard. Pt able to scoot back in bed with min guard and increased time using own method. Pt request to remain sitting at EOB with needs met and call bell within reach.      Therapy Documentation Precautions:  Precautions Precautions: Fall Precaution  Comments: monitor O2 sats, desatted on Room Air Restrictions Weight Bearing Restrictions: No General:   Vital Signs: Therapy Vitals Temp: 98.5 F (36.9 C) Temp Source: Oral Pulse Rate: 84 Resp: 17 BP: 108/71 Patient Position (if appropriate): Sitting Oxygen Therapy SpO2: 100 % O2 Device: Nasal Cannula O2 Flow Rate (L/min): 2 L/min Pain: Pain Assessment Pain Scale: 0-10 Pain Score: 10-Worst pain ever Pain Location: Knee Pain Descriptors / Indicators: Aching Patients Stated Pain Goal: 6 Pain Intervention(s): Medication (See eMAR);Cold applied  See Function Navigator for Current Functional Status.   Therapy/Group: Individual Therapy  Ragna Kramlich  Hildy Nicholl, PTA  08/13/2017, 4:36 PM

## 2017-08-13 NOTE — Progress Notes (Signed)
Occupational Therapy Session Note  Patient Details  Name: Dana Abbott MRN: [id number] Date of Birth: Sep 02, 1965  Today's Date: 08/13/2017 OT Individual Time: 4098-1191 and 1430-1530 OT Individual Time Calculation (min): 40 min and 60 min   Short Term Goals: Week 1:  OT Short Term Goal 1 (Week 1): Pt will bathe UB maintaining O2 sats greater than 90% OT Short Term Goal 2 (Week 1): Pt will sit to stand wiht MIN A OT Short Term Goal 3 (Week 1): Pt will transfer to [facility] wiht touching A and LRAD OT Short Term Goal 4 (Week 1): Pt will bathe UB wiht touching A and AE PRN  Skilled Therapeutic Interventions/Progress Updates:    1)Treatment session with focus on functional mobility and BUE strengthening.  Pt received seated on EOB with reports of pain in Rt knee but willing to "work through it".  Pt completed sit > stand with min guard from EOB and ambulated with Rollator 8' to w/c with min assist.  Engaged in sit > stand x5 with focus on increased weight shift and standing tolerance, pt very reliant on BUE for sit > stand.  Completed 2 sets of 10 chest pulls, tricep stretches, and PNF pattern diagonals with theraband for BUE strengthening.  Pt returned to room and completed stand pivot transfer back to bed with Rollator and min guard.  2) Treatment session with focus on functional mobility, activity tolerance, and standing balance.  Pt reports desire to wash clothing.  Engaged in discussion regarding home setup and typical laundry routine.  Pt reports placing clothes in top loader washing machine and then daughter moving clothes to dryer and retrieving them from dryer as she is unable to reach due to stature and body habitus.  Laundry facility unavailable at this time, therefore engaged in standing activity.  Engaged in sit > stand with focus on decreased dependence on pulling up with UE.  Pt tolerated standing 2 mins and then 45 seconds while completing table top activity.  Pt reports sitting during  majority of ADL and IADLs at home.  BUE strengthening with 3" dowel rod with 2 sets of 10 chest presses and bicep curls followed by PNF pattern diagonals with 2# medicine ball.  Pt reports need to toilet.  Ambulated 10' with Rollator to bathroom and doffed pants with min guard during dynamic standing.  Pt passed off to RN while on toilet.   Therapy Documentation Precautions:  Precautions Precautions: Fall Precaution Comments: monitor O2 sats, desatted on Room Air Restrictions Weight Bearing Restrictions: No General:   Vital Signs: Therapy Vitals Temp: 98.5 F (36.9 C) Temp Source: Oral Pulse Rate: 84 Resp: 17 BP: 108/71 Patient Position (if appropriate): Sitting Oxygen Therapy SpO2: 100 % O2 Device: Nasal Cannula O2 Flow Rate (L/min): 2 L/min Pain: Pain Assessment Pain Scale: 0-10 Pain Score: 10-Worst pain ever Pain Location: Knee Pain Descriptors / Indicators: Aching Patients Stated Pain Goal: 6 Pain Intervention(s): Medication (See eMAR);Cold applied  See Function Navigator for Current Functional Status.   Therapy/Group: Individual Therapy  Rosalio Loud 08/13/2017, 4:07 PM

## 2017-08-13 NOTE — Progress Notes (Signed)
Physical Therapy Note  Patient Details  Name: Shirli Lamartina MRN: [id number] Date of Birth: [birth year] Today's Date: 08/13/2017  1140-1210, 30 min indivual tx Pain: ": a little bit" R knee; cryotherapy at end of session, and pt will ask for pain meds  Seated 10 x 1 L long arc quad knee ext, bil heel raises, bil toe raises, bil hip adduction, 2 x 10 trunk flex/extension. Sit> stand with stand by assitance to rollator walker.   Attempted gait , but pt's R knee pain too severe, and she backed up and sat EOB modified independent using rails to scoot backward safely onto bed.   PT provided pt with ice pack.  Pt left resting sitting EOB with all needs at hand, set up for lunch.  See function navigator for current status.  Jamire Shabazz 08/13/2017, 11:42 AM

## 2017-08-14 ENCOUNTER — Inpatient Hospital Stay (HOSPITAL_COMMUNITY): Payer: Medicaid Other | Admitting: Physical Therapy

## 2017-08-14 ENCOUNTER — Inpatient Hospital Stay (HOSPITAL_COMMUNITY): Payer: Medicaid Other | Admitting: Occupational Therapy

## 2017-08-14 ENCOUNTER — Inpatient Hospital Stay (HOSPITAL_COMMUNITY): Payer: Medicaid Other

## 2017-08-14 LAB — BASIC METABOLIC PANEL
Anion gap: 6 (ref 5–15)
BUN: 7 mg/dL (ref 6–20)
CHLORIDE: 101 mmol/L (ref 101–111)
CO2: 30 mmol/L (ref 22–32)
CREATININE: 0.44 mg/dL (ref 0.44–1.00)
Calcium: 8.5 mg/dL — ABNORMAL LOW (ref 8.9–10.3)
GFR calc Af Amer: >60 mL/min (ref >60)
GLUCOSE: 89 mg/dL (ref 65–99)
POTASSIUM: 3.9 mmol/L (ref 3.5–5.1)
Sodium: 137 mmol/L (ref 135–145)

## 2017-08-14 NOTE — Progress Notes (Signed)
Physical Therapy Session Note  Patient Details  Name: Dana Abbott MRN: [id number] Date of Birth: [birth year]  Today's Date: 08/14/2017 PT Individual Time: 1334-1400 PT Individual Time Calculation (min): 26 min   Short Term Goals: Week 1:  PT Short Term Goal 1 (Week 1): Pt will perform bed mobility with supervision assist consistently  PT Short Term Goal 2 (Week 1): Pt will ambulate 65ft with LRAD and supervision assist  PT Short Term Goal 3 (Week 1): Pt will propell WC 22ft with and supervision assist  PT Short Term Goal 4 (Week 1): Pt will trasnfer to and from [facility] with supervision assist.   Skilled Therapeutic Interventions/Progress Updates:    Focused on functional LE strengthening for improved mobility including resistive hamstring pulls and LAQ x 10 reps each BLE, isometric hip abduction and adduction squeezes in seated position with 5 second hold x 10 reps each, and BLE heel raises and toe raises x 10 reps each. Blocked practice sit <> stands with supervision x 5 reps for functional strengthening and transfer training. Short distance gait with rollator with supervision in room with focus on R heel strike. During functional rest breaks discussed d/c planning.   Therapy Documentation Precautions:  Precautions Precautions: Fall Precaution Comments: monitor O2 sats, desatted on Room Air Restrictions Weight Bearing Restrictions: No   Vital Signs: Therapy Vitals Pulse Rate: 69 Resp: 18 BP: (!) 81/52 Patient Position (if appropriate): Sitting Oxygen Therapy SpO2: 100 % O2 Device: Nasal Cannula O2 Flow Rate (L/min): 2 L/min Pain:  c/o pain in R knee - premedicated   See Function Navigator for Current Functional Status.   Therapy/Group: Individual Therapy  Karolee Stamps Darrol Poke, PT, DPT  08/14/2017, 2:09 PM

## 2017-08-14 NOTE — Progress Notes (Signed)
Physical Therapy Session Note  Patient Details  Name: Dana Abbott MRN: [id number] Date of Birth: 07-02-1965  Today's Date: 08/14/2017 PT Individual Time: 0920-1020 PT Individual Time Calculation (min): 60 min   Short Term Goals: Week 1:  PT Short Term Goal 1 (Week 1): Pt will perform bed mobility with supervision assist consistently  PT Short Term Goal 2 (Week 1): Pt will ambulate 55f with LRAD and supervision assist  PT Short Term Goal 3 (Week 1): Pt will propell WC 376fwith and supervision assist  PT Short Term Goal 4 (Week 1): [facility].   Skilled Therapeutic Interventions/Progress Updates: Pt presented sitting at EOB completing self cleaning and agreeable to therapy. Pt performed sit to stand supervision and pulled up pants with no AD. Pt ambulated to w/c in hall approx 1059fith min guard. Pt transported to rehab gym total assist for energy conservation. Performed standing tolerance activities including peg board and connect four at high/low table. Pt able to maintain standing for periods up to one minute and demosntrated improved foot flat on floor. Participated in seated therex including heel rises, toe rises, LAQ with AA to complete full extension x 10 bilaterally, knee flexion ROM RLE, HS pulls with L1 resistance band  LLE x 10. Pt transported back to room and ambulated from door to bed min guard with pt able to ambulate with improved R heel strike. Pt transferred to bed and scooted back in bed with use of bed rail. PTA provided ice pack and pt left sitting at EOB with call bell within reach and needs met.      Therapy Documentation Precautions:  Precautions Precautions: Fall Precaution Comments: monitor O2 sats, desatted on Room Air Restrictions Weight Bearing Restrictions: No   See Function Navigator for Current Functional Status.   Therapy/Group: Individual Therapy  Zakkery Dorian  Giavanni Zeitlin, PTA  08/14/2017,  12:13 PM

## 2017-08-14 NOTE — Progress Notes (Signed)
Occupational Therapy Session Note  Patient Details  Name: Dana Abbott MRN: [id number] Date of Birth: 02/22/1966  Today's Date: 08/14/2017 OT Individual Time: 1100-1155 and 4782-9562 OT Individual Time Calculation (min): 55 min and 43 min   Short Term Goals: Week 1:  OT Short Term Goal 1 (Week 1): Pt will bathe UB maintaining O2 sats greater than 90% OT Short Term Goal 2 (Week 1): Pt will sit to stand wiht MIN A OT Short Term Goal 3 (Week 1): Pt will transfer to [facility] wiht touching A and LRAD OT Short Term Goal 4 (Week 1): Pt will bathe UB wiht touching A and AE PRN  Skilled Therapeutic Interventions/Progress Updates:    1) Treatment session with focus on functional mobility, standing tolerance to complete household tasks, and RLE stretching.  Pt received seated EOB, reporting already bathing/dressing.  Pt ambulated 15' with Rollator to w/c in hallway with min guard while therapist managed O2 tank.  Engaged in sit > stand and standing tolerance to complete laundry task, placing dirty clothes in washing machine while standing.  Pt reports daughter typically will move clothing from washing machine to dryer.  Engaged in heel slides and plantarflexion and dorsiflexion to promote knee extension and ROM within pain tolerance.  Completed 2 sets of 10 of each exercise above, followed by prolonged rest break.  Returned to room, ambulated back to bed 10' with Rollator and min guard.  2) Treatment session with focus on homemaking tasks and BUE strengthening. Pt received seated in w/c having just finished PT session.  Pt required assist to retrieve clothing items from front loading dryer due to positioning of dryer and pt body habitus.  Engaged in folding of clothing seated at table to simulate home routine.  Pt placed items in dresser drawers at Mod I level.  Engaged in 2 sets of 10 overhead presses and PNF pattern diagonals with 2# medicine ball for BUE strengthening and endurance.  Stand pivot transfer  with Rollator to EOB with min guard.  Therapy Documentation Precautions:  Precautions Precautions: Fall Precaution Comments: monitor O2 sats, desatted on Room Air Restrictions Weight Bearing Restrictions: No Pain:  Pt reports pain in Rt knee, but wanting to "work with it".  See Function Navigator for Current Functional Status.   Therapy/Group: Individual Therapy  Rosalio Loud 08/14/2017, 12:50 PM

## 2017-08-14 NOTE — Progress Notes (Signed)
Dana Abbott PHYSICAL MEDICINE & REHABILITATION     PROGRESS NOTE  Subjective/Complaints:  Pt seen sitting up in bed this Am.  She slept well overnight.  She feels tired this AM.    ROS: Denies CP, SOB, N/V/D  Objective: Vital Signs: Blood pressure 101/61, pulse 73, temperature 97.7 F (36.5 C), temperature source Oral, resp. rate 18, height 4\' 9"  (1.448 m), weight (!) 154.5 kg (340 lb 9.8 oz), SpO2 95 %. No results found. No results for input(s): WBC, HGB, HCT, PLT in the last 72 hours. Recent Labs    08/14/17 0454  NA 137  K 3.9  CL 101  GLUCOSE 89  BUN 7  CREATININE 0.44  CALCIUM 8.5*   CBG (last 3)  No results for input(s): GLUCAP in the last 72 hours.  Wt Readings from Last 3 Encounters:  08/14/17 (!) 154.5 kg (340 lb 9.8 oz)  08/09/17 (!) 159 kg (350 lb 8.5 oz)  11/02/15 (!) 167.4 kg (369 lb)    Physical Exam:  BP 101/61 (BP Location: Left Arm)   Pulse 73   Temp 97.7 F (36.5 C) (Oral)   Resp 18   Ht 4\' 9"  (1.448 m)   Wt (!) 154.5 kg (340 lb 9.8 oz)   SpO2 95%   BMI 73.71 kg/m  Constitutional: No distress . Vital signs reviewed. HEENT: EOMI, oral membranes moist Cardiovascular: RRR. No JVD    Respiratory: CTA Bilaterally. Normal effort. +[facility]: BS +, non-distended  Musculoskeletal:Right knee with mild Lymphedema b/l LE Neurological: She isalertand oriented.  Motor: 5/5 bilateral deltoid bicep tricep grip  2/5 hip flexion, knee extension, 4+/5 at the ankle dorsiflexors.  Skin: Skin iswarmand dry. Intact. Psych: pleasant and cooperative.   Assessment/Plan: 1. Functional deficits secondary to debility which require 3+ hours per day of interdisciplinary therapy in a comprehensive inpatient rehab setting. Physiatrist is providing close team supervision and 24 hour management of active medical problems listed below. Physiatrist and rehab team continue to assess barriers to discharge/monitor patient progress toward functional and medical  goals.  Function:  Bathing Bathing position      Bathing parts Body parts bathed by patient: Right arm, Left arm, Chest, Abdomen, Front perineal area, Right upper leg, Left upper leg Body parts bathed by helper: Buttocks, Right lower leg, Left lower leg, Back  Bathing assist Assist Level: (moderate assist, 70%)      Upper Body Dressing/Undressing Upper body dressing   What is the patient wearing?: Bra, Pull over shirt/dress Bra - Perfomed by patient: Thread/unthread right bra strap, Hook/unhook bra (pull down sports bra) Bra - Perfomed by helper: Thread/unthread left bra strap Pull over shirt/dress - Perfomed by patient: Thread/unthread right sleeve, Thread/unthread left sleeve, Put head through opening, Pull shirt over trunk          Upper body assist Assist Level: Touching or steadying assistance(Pt > 75%)      Lower Body Dressing/Undressing Lower body dressing   What is the patient wearing?: Socks(skirt)             Socks - Performed by patient: Don/doff right sock, Don/doff left sock                Lower body assist Assist for lower body dressing: Supervision or verbal cues      Toileting Toileting   Toileting steps completed by patient: Adjust clothing prior to toileting, Performs perineal hygiene, Adjust clothing after toileting Toileting steps completed by helper: Performs perineal hygiene, Adjust clothing after  toileting Toileting Assistive Devices: Grab bar or rail  Toileting assist Assist level: Supervision or verbal cues   Transfers Chair/bed transfer   Chair/bed transfer method: Stand pivot Chair/bed transfer assist level: Touching or steadying assistance (Pt > 75%) Chair/bed transfer assistive device: Armrests, Patent attorney     Max distance: 25' Assist level: Touching or steadying assistance (Pt > 75%)   Wheelchair   Type: Manual Max wheelchair distance: 27ft Assist Level: Supervision or verbal cues   Cognition Comprehension Comprehension assist level: Follows complex conversation/direction with no assist  Expression Expression assist level: Expresses complex ideas: With no assist  Social Interaction Social Interaction assist level: Interacts appropriately with others - No medications needed.  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 DVT Prophylaxis/Anticoagulation: Lovenox. Monitor for any bleeding episodes 3. Pain Management:Voltaren gel 3 times daily, Mobic 15 mg daily, hydrocodone as needed 4. Mood:Provide emotional support 5. Neuropsych: This patientiscapable of making decisions on herown behalf. 6. Skin/Wound Care:Routine skin checks 7. Fluids/Electrolytes/Nutrition:Routine I/O's   BMP within acceptable range on 4/23 8. Acute on chronic diastolic CHF: Weight daily. Heart healthy diet.Demadex as directed.   Follow for consistent pattern, I/Os  Filed Weights   08/12/17 0512 08/13/17 0415 08/14/17 0305  Weight: (!) 161 kg (354 lb 15.1 oz) (!) 160.2 kg (353 lb 2.8 oz) (!) 154.5 kg (340 lb 9.8 oz)   ?Reliability 9. Severe pulmonary HTN: with hypoxia:Monitor with increased mobility 10. LLL ZOX:[id number] course of Omnicef, will clarify date. 11. Anemia of chronic disease with B12 and iron deficiency:    Has been non-complianat with iron/B 12 injections. Monitor with serial checks. Educate on importance of medical compliance.    Continue iron supplement TID ,Change B12 injections to po   Hb 9.2 on 4/19   Cont to monitor 12. Severe right knee OA: Continue Mobic, ice 13. 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 Laxative assistance 15. Supplemental oxygen  dependent   Was using 1.5L PRN, which started 1 week PTA  Wean as possible.   LOS (Days) 5 A FACE TO FACE EVALUATION WAS PERFORMED  Ankit Karis Juba 08/14/2017 10:10 AM

## 2017-08-15 ENCOUNTER — Inpatient Hospital Stay (HOSPITAL_COMMUNITY): Payer: Medicaid Other | Admitting: Physical Therapy

## 2017-08-15 ENCOUNTER — Inpatient Hospital Stay (HOSPITAL_COMMUNITY): Payer: Medicaid Other | Admitting: Occupational Therapy

## 2017-08-15 ENCOUNTER — Encounter (HOSPITAL_COMMUNITY): Payer: Medicaid Other | Admitting: Psychology

## 2017-08-15 NOTE — Progress Notes (Signed)
Physical Therapy Session Note  Patient Details  Name: Dana Abbott MRN: [id number] Date of Birth: 13-Mar-1966  Today's Date: 08/15/2017 PT Individual Time: 5929-2446 PT Individual Time Calculation (min): 53 min   Short Term Goals: Week 1:  PT Short Term Goal 1 (Week 1): Pt will perform bed mobility with supervision assist consistently  PT Short Term Goal 2 (Week 1): Pt will ambulate 69f with LRAD and supervision assist  PT Short Term Goal 3 (Week 1): Pt will propell WC 339fwith and supervision assist  PT Short Term Goal 4 (Week 1): [facility]   Skilled Therapeutic Interventions/Progress Updates: Pt presented sitting EOB agreeable to therapy. Session focused on endurance and gait. Pt ambulated with w/c follow distances of [id number] 25ft30fh seated rests. SpO2 maintained above 95% with activity. Pt noted to have decreased L foot clearance with LLE. Pt transported remaining distance to rehab gym. Attempted placing LLE on step however pt unable to increase wt bearing on RLE to allow clearance. Pt participated in seated therex including LAQ with 3sec hold, ankle pumps, seated march, and HS pulls (LLE with L1 resistance band). Pt transported back to room and transferred back to EOB supervision. Pt left at EOB with call bell within reach and needs met.       Therapy Documentation Precautions:  Precautions Precautions: Fall Precaution Comments: monitor O2 sats, desatted on Room Air Restrictions Weight Bearing Restrictions: No   See Function Navigator for Current Functional Status.   Therapy/Group: Individual Therapy  Collan Schoenfeld  Dawnyel Leven, PTA  08/15/2017, 12:39 PM

## 2017-08-15 NOTE — Progress Notes (Signed)
Physical Therapy Session Note  Patient Details  Name: Dana Abbott MRN: [id number] Date of Birth: 10-May-1965  Today's Date: 08/15/2017 PT Individual Time: 1330-1400 AND 1530-1630   30 min and 60 min   Short Term Goals: Week 1:  PT Short Term Goal 1 (Week 1): Pt will perform bed mobility with supervision assist consistently  PT Short Term Goal 2 (Week 1): Pt will ambulate 80f with LRAD and supervision assist  PT Short Term Goal 3 (Week 1): Pt will propell WC 358fwith and supervision assist  PT Short Term Goal 4 (Week 1): [facility]   Skilled Therapeutic Interventions/Progress Updates:  Session 1 Pt received sitting EOB and agreeable to PT.  PT treatment focused on gait training with various AD. Gait with rollator x 1577fith superversion assist from and min cues for safety in turns. Additional gait training with Bariatric RW x 23f75fth supervision assist with min cues for step to gait pattern to prevent foot drag on the L. Stand pivot transfer to bed with supervision assist and RW. Pt left sitting EOB with call bell in reach.    session 2.  Pt received sitting EOB and agreeable to PT. Stand pivot [facility] and supervision assist from PT. Pt transported to day [facility]ron 30[facility]/sec 5 x 1 minute and 2 x 30 sec. Min from PT for increased force to maximize endurance and strengthening benefits of exercise. Mild knee pain upon  Completion. Gait training with RW x 15 ft with supervision assist and min cues for gait pattern to limit foot drag. Pt self limiting due to reported soreness in L thigh. Pt performed stand pivot to [facility] supervision assist as listed above. Transported to rehab gym in WC. [facility] instructed pt in stair negotiation 2 x 2" step in parallel bars. Min assist from PT for safety and min cues for gait pattern due to L knee pain. Patient returned to room and performed stand pivot transfer to bed  with RW and supervision assist. Pt left sitting EOB with call bell in reach and all needs met.         Therapy Documentation Precautions:  Precautions Precautions: Fall Precaution Comments: monitor O2 sats, desatted on Room Air Restrictions Weight Bearing Restrictions: No Vital Signs: Therapy Vitals Temp: 97.8 F (36.6 C) Temp Source: Oral Pulse Rate: 83 Resp: 18 BP: 105/65 Patient Position (if appropriate): Sitting Oxygen Therapy SpO2: 96 % O2 Device: Nasal Cannula O2 Flow Rate (L/min): 2 L/min Pain: 6/10 L knee. (sore)  See Function Navigator for Current Functional Status.   Therapy/Group: Individual Therapy  AustLorie Phenix4/2019, 4:35 PM

## 2017-08-15 NOTE — Progress Notes (Signed)
[facility] PHYSICAL MEDICINE & REHABILITATION     PROGRESS NOTE  Subjective/Complaints:  Patient seen sitting up in bed this morning.  She states she slept well overnight.  She requests her IV access to be discontinued.  She is happy about the fact that she was able to get her heels on the ground with therapies yesterday.  ROS: Denies CP, SOB, N/V/D  Objective: Vital Signs: Blood pressure 96/61, pulse 83, temperature 97.9 F (36.6 C), temperature source Oral, resp. rate 17, height 4\' 9"  (1.448 m), weight (!) 154.7 kg (341 lb 0.8 oz), SpO2 97 %. No results found. No results for input(s): WBC, HGB, HCT, PLT in the last 72 hours. Recent Labs    08/14/17 0454  NA 137  K 3.9  CL 101  GLUCOSE 89  BUN 7  CREATININE 0.44  CALCIUM 8.5*   CBG (last 3)  No results for input(s): GLUCAP in the last 72 hours.  Wt Readings from Last 3 Encounters:  08/15/17 (!) 154.7 kg (341 lb 0.8 oz)  08/09/17 (!) 159 kg (350 lb 8.5 oz)  11/02/15 (!) 167.4 kg (369 lb)    Physical Exam:  BP 96/61 (BP Location: Left Arm)   Pulse 83   Temp 97.9 F (36.6 C) (Oral)   Resp 17   Ht 4\' 9"  (1.448 m)   Wt (!) 154.7 kg (341 lb 0.8 oz)   SpO2 97%   BMI 73.80 kg/m  Constitutional: No distress . Vital signs reviewed. HEENT: EOMI, oral membranes moist Cardiovascular: RRR. No JVD    Respiratory: CTA Bilaterally.  Normal effort. +[place] GI: BS +, non-distended  Musculoskeletal:Right knee with mild (stable) Lymphedema b/l LE Neurological: She isalertand oriented.  Motor: 5/5 bilateral deltoid bicep tricep grip  3/5 hip flexion, knee extension, 4+/5 at the ankle dorsiflexors.  Skin: Skin iswarmand dry. Intact. Psych: pleasant and cooperative.   Assessment/Plan: 1. Functional deficits secondary to debility which require 3+ hours per day of interdisciplinary therapy in a comprehensive inpatient rehab setting. Physiatrist is providing close team supervision and 24 hour management of active medical problems  listed below. Physiatrist and rehab team continue to assess barriers to discharge/monitor patient progress toward functional and medical goals.  Function:  Bathing Bathing position      Bathing parts Body parts bathed by patient: Right arm, Left arm, Chest, Abdomen, Front perineal area, Right upper leg, Left upper leg Body parts bathed by helper: Buttocks, Right lower leg, Left lower leg, Back  Bathing assist Assist Level: (moderate assist, 70%)      Upper Body Dressing/Undressing Upper body dressing   What is the patient wearing?: Bra, Pull over shirt/dress Bra - Perfomed by patient: Thread/unthread right bra strap, Hook/unhook bra (pull down sports bra) Bra - Perfomed by helper: Thread/unthread left bra strap Pull over shirt/dress - Perfomed by patient: Thread/unthread right sleeve, Thread/unthread left sleeve, Put head through opening, Pull shirt over trunk          Upper body assist Assist Level: Touching or steadying assistance(Pt > 75%)      Lower Body Dressing/Undressing Lower body dressing   What is the patient wearing?: Socks(skirt)             Socks - Performed by patient: Don/doff right sock, Don/doff left sock                Lower body assist Assist for lower body dressing: Supervision or verbal cues      Toileting Toileting   Toileting steps  completed by patient: Adjust clothing prior to toileting, Performs perineal hygiene, Adjust clothing after toileting Toileting steps completed by helper: Performs perineal hygiene Toileting Assistive Devices: Grab bar or rail  Toileting assist Assist level: Supervision or verbal cues   Transfers Chair/bed transfer   Chair/bed transfer method: Stand pivot Chair/bed transfer assist level: Touching or steadying assistance (Pt > 75%) Chair/bed transfer assistive device: Armrests, Patent attorney     Max distance: 25' Assist level: Touching or steadying assistance (Pt > 75%)   Wheelchair    Type: Manual Max wheelchair distance: 64ft Assist Level: Supervision or verbal cues  Cognition Comprehension Comprehension assist level: Follows complex conversation/direction with no assist  Expression Expression assist level: Expresses complex ideas: With no assist  Social Interaction Social Interaction assist level: Interacts appropriately with others - No medications needed.  Problem Solving Problem solving assist level: Solves complex problems: Recognizes & self-corrects  Memory Memory assist level: Complete Independence: No helper    Medical Problem List and Plan: 1.Debilitysecondary to acute on chronic disystolic heart failure with underlying chronic respiratory failure  Cont CIR   Team conference today to discuss current and goals and coordination of care, home and environmental barriers, and discharge planning with nursing, case manager, and therapies.  2. DVT Prophylaxis/Anticoagulation: Lovenox. Monitor for any bleeding episodes 3. Pain Management:Voltaren gel 3 times daily, Mobic 15 mg daily, hydrocodone as needed 4. Mood:Provide emotional support 5. Neuropsych: This patientiscapable of making decisions on herown behalf. 6. Skin/Wound Care:Routine skin checks 7. Fluids/Electrolytes/Nutrition:Routine I/O's   BMP within acceptable range on 4/23 8. Acute on chronic diastolic CHF: Weight daily. Heart healthy diet.Demadex as directed.   Follow for consistent pattern, I/Os  Filed Weights   08/13/17 0415 08/14/17 0305 08/15/17 0400  Weight: (!) 160.2 kg (353 lb 2.8 oz) (!) 154.5 kg (340 lb 9.8 oz) (!) 154.7 kg (341 lb 0.8 oz)   Stable 9. Severe pulmonary HTN: with hypoxia:Monitor with increased mobility 10. LLL BJY:NWGNFAOZH course of Omnicef 11. Anemia of chronic disease with B12 and iron deficiency:    Has been non-complianat with iron/B 12 injections. Monitor with serial checks. Educate on importance of medical compliance.    Continue iron supplement TID,  changed B12 injections to po   Hb 9.2 on 4/19   Cont to monitor 12. Severe right knee OA: Continue Mobic, ice 13. 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 Laxative assistance 15. Supplemental oxygen dependent   Was using 1.5L PRN, which started 1 week PTA  Wean as possible.   LOS (Days) 6 A FACE TO FACE EVALUATION WAS PERFORMED  Kayne Yuhas Karis Juba 08/15/2017 9:08 AM

## 2017-08-15 NOTE — Progress Notes (Signed)
Occupational Therapy Session Note  Patient Details  Name: Dana Abbott MRN: [id number] Date of Birth: Jan 30, 1966  Today's Date: 08/15/2017 OT Individual Time: 0900-1000 OT Individual Time Calculation (min): 60 min    Short Term Goals: Week 1:  OT Short Term Goal 1 (Week 1): Pt will bathe UB maintaining O2 sats greater than 90% OT Short Term Goal 2 (Week 1): Pt will sit to stand wiht MIN A OT Short Term Goal 3 (Week 1): Pt will transfer to [facility] wiht touching A and LRAD OT Short Term Goal 4 (Week 1): Pt will bathe UB wiht touching A and AE PRN  Skilled Therapeutic Interventions/Progress Updates:    Treatment session with focus on increased activity tolerance and endurance during self-care tasks. Pt received seated EOB, donned socks with increased time in modified circle sitting.  Ambulated 5' to sink with Rollator and supervision, stopping to gather clothing from dresser while standing with Rollator.  Engaged in grooming tasks and bathing seated at sink with setup for items.  Discussed home setup and pt goals with focus on increasing activity tolerance, standing tolerance, and functional distances with ambulation.  Pt reports desire is to d/c to daughter's home which requires her to go up 15 steps to enter.  Pt completed dressing tasks with setup for items, donning skirt over head then standing to pull skirt over hips and pull underwear up over hips.  Supervision during dynamic standing when pulling up underwear.  Engaged in sit > stand x3 with focus on increased standing tolerance, pt standing 1:10, 1:05 and 1:20 before needing to return to sitting.  Returned to EOB with Rollator and supervision.  Therapy Documentation Precautions:  Precautions Precautions: Fall Precaution Comments: monitor O2 sats, desatted on Room Air Restrictions Weight Bearing Restrictions: No Pain:  Pt reports "normal pain" rated 1-2.  Declined pain meds  See Function Navigator for Current Functional  Status.   Therapy/Group: Individual Therapy  Rosalio Loud 08/15/2017, 10:18 AM

## 2017-08-15 NOTE — Consult Note (Signed)
Neuropsychological Consultation   Patient:   Dana Abbott   DOB:   08-29-1965  MR Number:  [id number]  Location:  [facility] [address] [facility] [address] [id number] [place] [address] Dept: [phone] Loc: [phone]           Date of Service:   08/15/2017  Start Time:   8 Am End Time:   9 AM  Provider/Observer:  Arley Phenix, Psy.D.       Clinical Neuropsychologist       Billing Code/Service: [phone] 4 Units  Chief Complaint:    Dana Abbott is a 52 year old female with a history of OSA with home oxygen, CAD, congestive heart failure (with prior CIR stay for debility), gastric bypass with chronic iron deficiency anemia, morbid obesity.  The patient was admitted on 08/03/2017 after a fall onto her right chest and knee a week prior.  Progressive weakness have developed.  Increased BLE edema, right knee pain and shortness of breath.  Work-up revealed acute on chronic diastolic congestive heart failure.  Continued severe pain in her knee.  The patient was admitted for the comprehensive rehabilitation program after PT and OT evaluations were completed.  Reason for Service:  The patient was referred for neuropsychological consult due to coping and issues related to compliance and motivation around her medical issues and her gastric bypass.  Below is the HPI for the current admission.  Dana Abbott is a 52 year old female with history of OSAwith home oxygen 1.5 L as needed, CAD, CHF (prior CIR stay for debility), gastric bypass with chronic irons deficiency anemia, morbid obesity, who was admitted on 08/03/17 with reports of fall onto right chest and knee a week PTA with progressive weakness, increase in BLE edema, right knee pain and SOB. Work up revealed acute on chronic diastolic CHF with CAP LLL and anemia with Hgb 8.3. She was treated with IV diuresis and started on Rocephin and Zithromax. She received one  unit PRBC for anemia and was started on B12 supplementation.  She has had severe pain right knee and X rays revealed severe OA. Dr. Charlann Boxer consulted for input and knee injected on 4/12 with DepoMedrol. Ortho recommended weight loss (needs BMI <40 for surgical intervention), local measures as well as resumption of Mobic. She continues to have hypoxia with activity and 2D echo done revealing EF 65-70% with no wall abnormality, left ventricular septum with diastolic/systolic flattening and increased right ventricular pressures consistent with severe pulmonary HTN. Cardiology consulted for input with cardiaccatheterization4/17/2019with normal coronary arteries.Patient remains on Demadex. CTA lungs done to rule out PE and showed changes consistent with pulmonary HTN and hilar and mediastinal lymph nodes of uncertain significance (question prior granulomatous disease).Subcutaneous Lovenox for DVT prophylaxis.PT and OT evaluations completed with recommendations of physical medicine rehab consult. Patient was admitted for a comprehensive rehab program.  Current Status:  Dana Abbott reports that she has been trying to be physical and walks up to her daughter second-story apartment to help take care of the grandchild.  However, she does not describe any other physical activity of any significance.  While the patient claims that she eats a very healthy diet she was not able to go any specifics about her dietary patterns.  The patient had trouble describing what she is supposed to be doing relative to her numerous medical issues as far as self-care.  Behavioral Observation: Dana Abbott  presents as a 52 y.o.-year-old Right African American  Female who appeared her stated age. her dress was Appropriate and she was Well Groomed and her manners were Appropriate to the situation.  her participation was indicative of Appropriate and Attentive behaviors.  There were any physical disabilities noted.  she  displayed an appropriate level of cooperation and motivation.     Interactions:    Active Appropriate  Attention:   within normal limits and attention span and concentration were age appropriate  Memory:   within normal limits; recent and remote memory intact  Visuo-spatial:  within normal limits  Speech (Volume):  normal  Speech:   normal; normal  Thought Process:  Coherent and Relevant  Though Content:  WNL; not suicidal and not homicidal  Orientation:   person, place, time/date and situation  Judgment:   Fair  Planning:   Fair  Affect:    Appropriate  Mood:    NA  Insight:   Fair  Intelligence:   normal  Medical History:   Past Medical History:  Diagnosis Date  . Anemia    Reportedly from heavy menses and pernicious anemia  . Arthritis    "knees" (07/31/2015)  . CHF (congestive heart failure) (HCC) ?2003  . DVT (deep venous thrombosis) (HCC) "after my heart attack"   "one of my legs"  . Heavy menses   . History of blood transfusion "I've had alot"   "I'm anemic"  . Myocardial infarction [facility]) 2003?  . OSA (obstructive sleep apnea) 2003  . Stroke [facility]) ?2003   Psychiatric History:  No prior history of depression, anxiety, or other psychiatric issues.  Family Med/Psych History:  Family History  Problem Relation Age of Onset  . Stroke Mother   . Hypertension Mother   . COPD Father   . Diabetes type II Daughter   . Cancer Neg Hx     Risk of Suicide/Violence: low Patient denies SI or HI.   Impression/DX:  Dana Abbott is a 52 year old female with a history of OSA with home oxygen, CAD, congestive heart failure (with prior CIR stay for debility), gastric bypass with chronic iron deficiency anemia, morbid obesity.  The patient was admitted on 08/03/2017 after a fall onto her right chest and knee a week prior.  Progressive weakness have developed.  Increased BLE edema, right knee pain and shortness of breath.  Work-up revealed acute on chronic diastolic  congestive heart failure.  Continued severe pain in her knee.  The patient was admitted for the comprehensive rehabilitation program after PT and OT evaluations were completed.  Dana Abbott reports that she has been trying to be physical and walks up to her daughter second-story apartment to help take care of the grandchild.  However, she does not describe any other physical activity of any significance.  While the patient claims that she eats a very healthy diet she was not able to go any specifics about her dietary patterns.  The patient had trouble describing what she is supposed to be doing relative to her numerous medical issues as far as self-care.  Worked with patient regarding need to continue with physical activity during day beyond only doing what is required but actually being purposefully active during the day.  Stressed the importance of being very careful with diet and making sure that her reduced intake of food is coupled with high quality foods.  Explained Debility and how low activity levels develop this issues.    Diagnosis:    Medical illustrator  _______________________ Ilean Skill, Psy.D.

## 2017-08-16 ENCOUNTER — Inpatient Hospital Stay (HOSPITAL_COMMUNITY): Payer: Medicaid Other | Admitting: Occupational Therapy

## 2017-08-16 ENCOUNTER — Inpatient Hospital Stay (HOSPITAL_COMMUNITY): Payer: Medicaid Other | Admitting: Physical Therapy

## 2017-08-16 ENCOUNTER — Inpatient Hospital Stay (HOSPITAL_COMMUNITY): Payer: Medicaid Other

## 2017-08-16 LAB — CREATININE, SERUM: CREATININE: 0.38 mg/dL — AB (ref 0.44–1.00)

## 2017-08-16 NOTE — Progress Notes (Signed)
Physical Therapy Session Note  Patient Details  Name: Dana Abbott MRN: [id number] Date of Birth: 05-04-1965  Today's Date: 08/16/2017 PT Individual Time:1130-1200 AND 1420-1535   30 min AND 75 min   Short Term Goals: Week 1:  PT Short Term Goal 1 (Week 1): Pt will perform bed mobility with supervision assist consistently  PT Short Term Goal 2 (Week 1): Pt will ambulate 66f with LRAD and supervision assist  PT Short Term Goal 3 (Week 1): Pt will propell WC 358fwith and supervision assist  PT Short Term Goal 4 (Week 1): [facility]   Skilled Therapeutic Interventions/Progress Updates:   Session 1. Pt received sitting EOB and agreeable to PT. Sit>supine with supervision assist from PT. Supine therex instructed by PT SLR, Hip abbduction/adduction, heel slides with over pressure from PT, ankle pumps with manual resistance, bridges with PT to stabilize BLE. Supine>sit with supervision assist at end of treatment, and pt left sitting EOB with call bell in reach and all needs met.    Session 2.  Pt received sitting EOB and agreeable to PT. [facility]   [facility]. Gait training in simulated communtiy environment with supervision assist x [id number] UE support on RW.   Seated therex. LAQ, reciprocal hip flexion, hip abduction, calf raises. All therex completed x 12 BLE with min cues for ROM and reduced compensation through trunk.   Stair negotiation training x 2 (3")  With Bil rails and supervision assist from PT. Min cues for step to gait pattern and posture. PT instructed pt in  Attempt of  6" step x 1, but pt unable to advance the LLE due to fear of falling and reported pain in right thigh with attempt to push up to first step.    Patient returned to room and performed stand pivot transfer with RW and supervision assist, left sitting in EOB with call bell in reach and  all needs met.         Therapy Documentation Precautions:  Precautions Precautions: Fall Precaution Comments: monitor O2 sats, desatted on Room Air Restrictions Weight Bearing Restrictions: No Vital Signs: Therapy Vitals Temp: 98.1 F (36.7 C) Temp Source: Oral Pulse Rate: 78 Resp: 18 BP: 102/72 Patient Position (if appropriate): Sitting Oxygen Therapy SpO2: 96 % O2 Device: Nasal Cannula O2 Flow Rate (L/min): 2 L/min Pain:   denies at rest  See Function Navigator for Current Functional Status.   Therapy/Group: Individual Therapy  AusLorie Phenix25/2019, 3:04 PM

## 2017-08-16 NOTE — Progress Notes (Signed)
Physical Therapy Session Note  Patient Details  Name: Dana Abbott MRN: [id number] Date of Birth: Mar 17, 1966  Today's Date: 08/16/2017 PT Individual Time: 0900-0930 PT Individual Time Calculation (min): 30 min   Short Term Goals: Week 1:  PT Short Term Goal 1 (Week 1): Pt will perform bed mobility with supervision assist consistently  PT Short Term Goal 2 (Week 1): Pt will ambulate 73ft with LRAD and supervision assist  PT Short Term Goal 3 (Week 1): Pt will propell WC 73ft with and supervision assist  PT Short Term Goal 4 (Week 1): Pt will trasnfer to and from [facility] with supervision assist.   Skilled Therapeutic Interventions/Progress Updates:    Focused on therex at EOB and in standing to "warm up" muscles as pt reports not being up since last night and feeling "very tight". Engaged in seated marches BLE, LAQ BLE, and active assisted L knee flexion slides to increase functional strengthening and ROM to aid with overall mobility x 10 reps each. Sit <> stands with supervision and extra time due to pain and engaged in functional standing tolerance without UE support for increased endurance and progressed to attempts for standing marches in preparation for stair training activating hip flexion on L and stance tolerance on R. Pt able to lift RLE without issue but only able to lift L heel or shuffle foot, repeated 5 times each trial in standing. Placed heating pad to L quad/knee area at end of session for muscle relaxation with pt verbalizing understanding to remove if it got too hot prior to next session.   Therapy Documentation Precautions:  Precautions Precautions: Fall Precaution Comments: monitor O2 sats, desatted on Room Air Restrictions Weight Bearing Restrictions: No    Pain: Unrated. Reports pain on top of L knee/quad area described as "tightness". End of session applied heating pad to area and engaged in stretching during session.    See Function Navigator for Current Functional  Status.   Therapy/Group: Individual Therapy  Karolee Stamps Darrol Poke, PT, DPT  08/16/2017, 9:33 AM

## 2017-08-16 NOTE — Progress Notes (Signed)
Last BM 08/13/17. Pt refused laxative/sorbital.

## 2017-08-16 NOTE — Progress Notes (Signed)
Occupational Therapy Session Note  Patient Details  Name: Dana Abbott MRN: [id number] Date of Birth: Nov 06, 1965  Today's Date: 08/16/2017 OT Individual Time: 1005-1100 OT Individual Time Calculation (min): 55 min    Short Term Goals: Week 1:  OT Short Term Goal 1 (Week 1): Pt will bathe UB maintaining O2 sats greater than 90% OT Short Term Goal 2 (Week 1): Pt will sit to stand wiht MIN A OT Short Term Goal 3 (Week 1): Pt will transfer to [facility] wiht touching A and LRAD OT Short Term Goal 4 (Week 1): Pt will bathe UB wiht touching A and AE PRN  Skilled Therapeutic Interventions/Progress Updates:    Treatment session with focus on increased independence with self-care tasks.  Pt received seated EOB, reporting need to toilet.  Ambulated to bathroom 12' with Rollator with supervision.  Setup for positioning on toilet with pt able to complete all hygiene post toileting without assistance.  Completed bathing and dressing at sit > stand level at sink after setup for items and optimal positioning.  Pt required increased time and frequent rest breaks throughout, due to reports of fatigue this date.  Returned to EOB at end of session, ambulating with Rollator and supervision.  Therapy Documentation Precautions:  Precautions Precautions: Fall Precaution Comments: monitor O2 sats, desatted on Room Air Restrictions Weight Bearing Restrictions: No Pain: Pain Assessment Pain Scale: 0-10 Pain Score: 0-No pain  See Function Navigator for Current Functional Status.   Therapy/Group: Individual Therapy  Rosalio Loud 08/16/2017, 12:09 PM

## 2017-08-16 NOTE — Progress Notes (Signed)
[facility] PHYSICAL MEDICINE & REHABILITATION     PROGRESS NOTE  Subjective/Complaints:  Patient seen lying in bed this morning. She states she slept well overnight. She is looking forward to her discharge date.  ROS: denies CP, SOB, N/V/D  Objective: Vital Signs: Blood pressure 100/69, pulse 84, temperature 98 F (36.7 C), temperature source Oral, resp. rate 18, height 4\' 9"  (1.448 m), weight (!) 155.2 kg (342 lb 2.5 oz), SpO2 99 %. No results found. No results for input(s): WBC, HGB, HCT, PLT in the last 72 hours. Recent Labs    08/14/17 0454 08/16/17 0553  NA 137  --   K 3.9  --   CL 101  --   GLUCOSE 89  --   BUN 7  --   CREATININE 0.44 0.38*  CALCIUM 8.5*  --    CBG (last 3)  No results for input(s): GLUCAP in the last 72 hours.  Wt Readings from Last 3 Encounters:  08/16/17 (!) 155.2 kg (342 lb 2.5 oz)  08/09/17 (!) 159 kg (350 lb 8.5 oz)  11/02/15 (!) 167.4 kg (369 lb)    Physical Exam:  BP 100/69 (BP Location: Left Arm)   Pulse 84   Temp 98 F (36.7 C) (Oral)   Resp 18   Ht 4\' 9"  (1.448 m)   Wt (!) 155.2 kg (342 lb 2.5 oz)   SpO2 99%   BMI 74.04 kg/m  Constitutional: No distress . Vital signs reviewed. HEENT: EOMI, oral membranes moist Cardiovascular: RRR. No JVD    Respiratory: CTA Bilaterally.  Normal effort. +Paint GI: BS +, non-distended  Musculoskeletal:Right knee with mild (?improving) Lymphedema b/l LE Neurological: She isalertand oriented.  Motor: 5/5 bilateral deltoid bicep tricep grip  3+/5 hip flexion, knee extension, 4+/5 at the ankle dorsiflexors.  Skin: Skin iswarmand dry. Intact. Psych: pleasant and cooperative.   Assessment/Plan: 1. Functional deficits secondary to debility which require 3+ hours per day of interdisciplinary therapy in a comprehensive inpatient rehab setting. Physiatrist is providing close team supervision and 24 hour management of active medical problems listed below. Physiatrist and rehab team continue to  assess barriers to discharge/monitor patient progress toward functional and medical goals.  Function:  Bathing Bathing position   Position: Wheelchair/chair at sink  Bathing parts Body parts bathed by patient: Right arm, Left arm, Chest, Abdomen, Front perineal area, Right upper leg, Left upper leg, Buttocks Body parts bathed by helper: Buttocks, Right lower leg, Left lower leg, Back  Bathing assist Assist Level: Supervision or verbal cues, Set up   Set up : To obtain items  Upper Body Dressing/Undressing Upper body dressing   What is the patient wearing?: Pull over shirt/dress(donned skirt overhead as well) Bra - Perfomed by patient: Thread/unthread right bra strap, Hook/unhook bra (pull down sports bra) Bra - Perfomed by helper: Thread/unthread left bra strap Pull over shirt/dress - Perfomed by patient: Thread/unthread right sleeve, Thread/unthread left sleeve, Put head through opening, Pull shirt over trunk          Upper body assist Assist Level: Supervision or verbal cues      Lower Body Dressing/Undressing Lower body dressing   What is the patient wearing?: Non-skid slipper socks         Non-skid slipper socks- Performed by patient: Don/doff right sock, Don/doff left sock   Socks - Performed by patient: Don/doff right sock, Don/doff left sock                Lower body assist  Assist for lower body dressing: Supervision or verbal cues      Toileting Toileting   Toileting steps completed by patient: Adjust clothing prior to toileting, Performs perineal hygiene, Adjust clothing after toileting Toileting steps completed by helper: Performs perineal hygiene Toileting Assistive Devices: Grab bar or rail  Toileting assist Assist level: Supervision or verbal cues   Transfers Chair/bed transfer   Chair/bed transfer method: Ambulatory Chair/bed transfer assist level: Supervision or verbal cues Chair/bed transfer assistive device: Armrests, Scientific laboratory technician     Max distance: 25' Assist level: Touching or steadying assistance (Pt > 75%)   Wheelchair   Type: Manual Max wheelchair distance: 49ft Assist Level: Supervision or verbal cues  Cognition Comprehension Comprehension assist level: Follows complex conversation/direction with no assist  Expression Expression assist level: Expresses complex ideas: With no assist  Social Interaction Social Interaction assist level: Interacts appropriately with others - No medications needed.  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 DVT Prophylaxis/Anticoagulation: Lovenox. Monitor for any bleeding episodes 3. Pain Management:Voltaren gel 3 times daily, Mobic 15 mg daily, hydrocodone as needed 4. Mood:Provide emotional support 5. Neuropsych: This patientiscapable of making decisions on herown behalf. 6. Skin/Wound Care:Routine skin checks 7. Fluids/Electrolytes/Nutrition:Routine I/O's   BMP within acceptable range on 4/23, creatinine within acceptable range on 4/25 8. Acute on chronic diastolic CHF: Weight daily. Heart healthy diet.Demadex as directed.   Follow for consistent pattern, I/Os  Filed Weights   08/14/17 0305 08/15/17 0400 08/16/17 0451  Weight: (!) 154.5 kg (340 lb 9.8 oz) (!) 154.7 kg (341 lb 0.8 oz) (!) 155.2 kg (342 lb 2.5 oz)   ? Trending up 9. Severe pulmonary HTN: with hypoxia:Monitor with increased mobility 10. LLL ZOX:[id number] course of Omnicef 11. Anemia of chronic disease with B12 and iron deficiency:    Has been non-complianat with iron/B 12 injections. Monitor with serial checks. Educate on importance of medical compliance.    Continue iron supplement TID,  changed B12 injections to po   Hb 9.2 on 4/19  Labs ordered for tomorrow   Cont to monitor 12. Severe right knee OA: Continue Mobic, ice 13. 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 Laxative assistance 15. Supplemental oxygen dependent   Was using 1.5L PRN, which started 1 week PTA  Wean as possible, currently on 2 L.   LOS (Days) 7 A FACE TO FACE EVALUATION WAS PERFORMED  Antoninette Lerner Karis Juba 08/16/2017 8:53 AM

## 2017-08-17 ENCOUNTER — Inpatient Hospital Stay (HOSPITAL_COMMUNITY): Payer: Medicaid Other

## 2017-08-17 ENCOUNTER — Inpatient Hospital Stay (HOSPITAL_COMMUNITY): Payer: Medicaid Other | Admitting: Physical Therapy

## 2017-08-17 ENCOUNTER — Inpatient Hospital Stay (HOSPITAL_COMMUNITY): Payer: Medicaid Other | Admitting: Occupational Therapy

## 2017-08-17 DIAGNOSIS — E876 Hypokalemia: Secondary | ICD-10-CM

## 2017-08-17 LAB — BASIC METABOLIC PANEL
Anion gap: 6 (ref 5–15)
BUN: 5 mg/dL — AB (ref 6–20)
CHLORIDE: 103 mmol/L (ref 101–111)
CO2: 28 mmol/L (ref 22–32)
CREATININE: 0.36 mg/dL — AB (ref 0.44–1.00)
Calcium: 8.3 mg/dL — ABNORMAL LOW (ref 8.9–10.3)
GFR calc Af Amer: >60 mL/min (ref >60)
GFR calc non Af Amer: >60 mL/min (ref >60)
Glucose, Bld: 84 mg/dL (ref 65–99)
POTASSIUM: 3.8 mmol/L (ref 3.5–5.1)
SODIUM: 137 mmol/L (ref 135–145)

## 2017-08-17 LAB — CBC WITH DIFFERENTIAL/PLATELET
BASOS PCT: 0 %
Basophils Absolute: 0 10*3/uL (ref 0.0–0.1)
EOS PCT: 3 %
Eosinophils Absolute: 0.1 10*3/uL (ref 0.0–0.7)
HEMATOCRIT: 34.4 % — AB (ref 36.0–46.0)
HEMOGLOBIN: 9.5 g/dL — AB (ref 12.0–15.0)
LYMPHS PCT: 24 %
Lymphs Abs: 0.7 10*3/uL (ref 0.7–4.0)
MCH: 25.3 pg — AB (ref 26.0–34.0)
MCHC: 27.6 g/dL — AB (ref 30.0–36.0)
MCV: 91.7 fL (ref 78.0–100.0)
MONOS PCT: 8 %
Monocytes Absolute: 0.2 10*3/uL (ref 0.1–1.0)
NEUTROS ABS: 2 10*3/uL (ref 1.7–7.7)
NEUTROS PCT: 65 %
Platelets: 195 10*3/uL (ref 150–400)
RBC: 3.75 MIL/uL — ABNORMAL LOW (ref 3.87–5.11)
RDW: 25.6 % — ABNORMAL HIGH (ref 11.5–15.5)
WBC: 3 10*3/uL — ABNORMAL LOW (ref 4.0–10.5)

## 2017-08-17 MED ORDER — TORSEMIDE 20 MG PO TABS
40.0000 mg | ORAL_TABLET | Freq: Two times a day (BID) | ORAL | Status: DC
  Administered 2017-08-18 – 2017-08-21 (×5): 40 mg via ORAL
  Filled 2017-08-17 (×11): qty 2

## 2017-08-17 MED ORDER — POTASSIUM CHLORIDE CRYS ER 20 MEQ PO TBCR
30.0000 meq | EXTENDED_RELEASE_TABLET | Freq: Every day | ORAL | Status: DC
  Administered 2017-08-18 – 2017-08-22 (×5): 30 meq via ORAL
  Filled 2017-08-17 (×5): qty 1

## 2017-08-17 NOTE — Progress Notes (Signed)
Physical Therapy Weekly Progress Note  Patient Details  Name: Dana Abbott MRN: [id number] Date of Birth: Feb 13, 1966  Beginning of progress report period: August 10, 2017 End of progress report period: August 17, 2017  Today's Date: 08/17/2017 PT Individual Time:1100-1200   60 min   Patient has met 4 of 4 short term goals.  Pt making good progress towards long term goals. Currently functioning at supervision assist for transfers and gait for short distances.  LE pain and fear of falling ar this pt's most limiting factors to allow access to 2nd story apartment with 15 stairs to access.   Patient continues to demonstrate the following deficits muscle weakness and muscle joint tightness, decreased cardiorespiratoy endurance and decreased oxygen support and decreased standing balance and decreased balance strategies and therefore will continue to benefit from skilled PT intervention to increase functional independence with mobility.  Patient progressing toward long term goals..  Continue plan of care.  PT Short Term Goals Week 1:  PT Short Term Goal 1 (Week 1): Pt will perform bed mobility with supervision assist consistently  PT Short Term Goal 1 - Progress (Week 1): Met PT Short Term Goal 2 (Week 1): Pt will ambulate 76f with LRAD and supervision assist  PT Short Term Goal 2 - Progress (Week 1): Met PT Short Term Goal 3 (Week 1): Pt will propell WC 358fwith and supervision assist  PT Short Term Goal 3 - Progress (Week 1): Met PT Short Term Goal 4 (Week 1): [facility].  PT Short Term Goal 4 - Progress (Week 1): Met Week 2:  PT Short Term Goal 1 (Week 2): STG=LTG due to ELOS   Skilled Therapeutic Interventions/Progress Updates:      Pt received sitting EOB and agreeable to PT.    Ambulatory [facility] and supervision assist from PT x 15 ft. Min cues for posture to allow improved foot clearance on L  Stair management training  for up/down 1 step in parallel bars. 2" step x 4. 54 inch step x 2 and 6 inch step x 1. Min-supervision assist from PT with moderate cues for technique, UE placement, posture and gait pattern. Pt noted to require increased time/effort to lift the LLE due to pain in the R knee.   BLE therex.  SAQ, reciprocal hip flexion, hip abduction, hip extension from flexed position. All compelted x 12 BLE with cuse for full ROM and decreased speed of eccentric movement.   Patient returned to room and [facility] reach and all needs met.       Therapy Documentation Precautions:  Precautions Precautions: Fall Precaution Comments: monitor O2 sats, desatted on Room Air Restrictions Weight Bearing Restrictions: No Pain: denies at rest.   See Function Navigator for Current Functional Status.  Therapy/Group: Individual Therapy  AuLorie Phenix/26/2019, 11:33 AM

## 2017-08-17 NOTE — Progress Notes (Signed)
Social Work Patient ID: Dana Abbott, female   DOB: Apr 15, 1966, 52 y.o.   MRN: [id number]   CSW met with pt 08-15-17 to update her on team conference discussion and targeted d/c date of 08-22-17.  She feels she will be ready by then and knows she needs to work hard on the stairs, so she can go to her dtr's home.  Pt will not have a Medicaid qualifying diagnosis for therapies, so CSW asked for therapists to give her home exercises.  Therapists recommend a bariatric rollator and pt stated she purchased her current rollator, so pt feels she should be eligible to get one under her Medicaid.  CSW will check and will continue to follow and assist as needed.

## 2017-08-17 NOTE — Patient Care Conference (Signed)
Inpatient RehabilitationTeam Conference and Plan of Care Update Date: 08/15/2017   Time: 11:40 AM    Patient Name: Dana Abbott      Medical Record Number: [id number]  Date of Birth: 1965-09-06 Sex: Female         Room/Bed: 4M10C/4M10C-01 Payor Info: Payor: MEDICAID [place] / Plan: MEDICAID [place] ACCESS / Product Type: *No Product type* /    Admitting Diagnosis: Degility  Admit Date/Time:  08/09/2017  4:25 PM Admission Comments: No comment available   Primary Diagnosis:  <principal problem not specified> Principal Problem: <principal problem not specified>  Patient Active Problem List   Diagnosis Date Noted  . Hypokalemia   . Supplemental oxygen dependent   . Anemia of chronic disease   . Chronic respiratory failure (HCC)   . Precordial chest pain   . PAH (pulmonary artery hypertension) (HCC)   . Acute on chronic anemia   . Pain   . Super-super obese (HCC)   . Community acquired pneumonia of left lower lobe of lung (HCC)   . Symptomatic anemia 08/03/2017  . Myocardial infarction (HCC)   . Stroke (HCC)   . Anemia   . Arthritis   . CHF (congestive heart failure) (HCC)   . DVT (deep venous thrombosis) (HCC)   . Heavy menses   . Vitamin B 12 deficiency 09/29/2015  . Debility 09/27/2015  . Obesity hypoventilation syndrome (HCC)   . Exertional dyspnea   . Requires supplemental oxygen   . Primary osteoarthritis of right knee   . Abnormality of gait   . Fluid retention   . Hypoalbuminemia due to protein-calorie malnutrition (HCC)   . Iron deficiency anemia   . S/P gastric bypass   . Vitamin D deficiency   . OSA treated with BiPAP   . Pernicious anemia 09/22/2015  . CAD (coronary artery disease), native coronary artery 09/22/2015  . History of DVT (deep vein thrombosis) 09/22/2015  . Dyspnea 07/31/2015  . Acute on chronic diastolic heart failure (HCC) 07/30/2015  . Hypoxia 07/30/2015  . Blood loss anemia 07/30/2015  . OSA (obstructive sleep apnea) 04/24/2001     Expected Discharge Date: Expected Discharge Date: 08/22/17  Team Members Present: Physician leading conference: Dr. Maryla Morrow Social Worker Present: Staci Acosta, LCSW Nurse Present: Chana Bode, RN PT Present: Harless Litten, PTA OT Present: Rosalio Loud, OT SLP Present: Reuel Derby, SLP PPS Coordinator present : Tora Duck, RN, CRRN     Current Status/Progress Goal Weekly Team Focus  Medical   Debility secondary to acute on chronic disystolic heart failure with underlying chronic respiratory failure  Improve mobility, supplemental O2 requirement, knee OA  See above   Bowel/Bladder   Continent of bladder/bowel, LBM 08/11/17, Has prn  Sorbitol  Maintain conntience  Assess for BM qs and prn provide prn medication if no BM within 2-3 days   Swallow/Nutrition/ Hydration             ADL's   min guard transfers, mod assist bathing, min guard to supervision LB dressing  Mod I overall, Supervision shower transfers  standing tolerance, activity tolerance, ADL retraining   Mobility   min guard transfers, gait up to 36ft with rollator, standing tolerance up to 1 min with UE support  mod I overall with LRAD  LE strengthening, endurance, standing tolerance, gait   Communication             Safety/Cognition/ Behavioral Observations  Alert, cooperative, no behavior concerns, No falls since admission Rehab, prior fall before admission  No falls  Safety precaution maintain High Risk Parameters and monitoring, educate patient /family on departmental guidelines and standard r/t safety on-going   Pain   C/O Right Knee pain -aching throbbing sensation, Hx OA  pain score  of 8/10 prn medication administered follow up  ain score 5-6/10  < 5  Assess QS and prn provide medication Norco 5-325mg  1-2 tabs q 6 hrs, Tylenol prn Q 4 hrs,Volteran TID, ice pack prn   Skin    WDL,Intact   Maintain skin integumentary  Assess QS and prn, notify MD/PA if changes occurs    Rehab Goals Patient on  target to meet rehab goals: Yes Rehab Goals Revised: none *See Care Plan and progress notes for long and short-term goals.     Barriers to Discharge  Current Status/Progress Possible Resolutions Date Resolved   Physician    Medical stability     See above  Therapies, wean supplemental O2 as tolerated, local measures for knee OA      Nursing                  PT                    OT                  SLP                SW                Discharge Planning/Teaching Needs:  Pt wants to go to her dtr's apartment at d/c.  She needs to be able to climb 15 steps to enter.  Family offered education as needed.   Team Discussion:  Pt is overall medically stable.  She has OA in her knee and blood loss anemia, supplemental oxygen - at home, too.  Pt is continent of bladder and has BM once a week.  Pt requires pain medications 1/day.  Pt is min guard/ S with mobility and can walk 5-10' with rollator.  She does bathing and dressing at sink, which is what she did at home PTA.  Therapists hope to help her increase her activity/standing tolerance.  Pt walked 40' with PT and O2 sats stayed in the high 90s.  Min guard/close S.  PT attempted stairs with pt today and she cannot do them yet, but needs to do 15 prior to d/c.  Revisions to Treatment Plan:  none    Continued Need for Acute Rehabilitation Level of Care: The patient requires daily medical management by a physician with specialized training in physical medicine and rehabilitation for the following conditions: Daily direction of a multidisciplinary physical rehabilitation program to ensure safe treatment while eliciting the highest outcome that is of practical value to the patient.: Yes Daily medical management of patient stability for increased activity during participation in an intensive rehabilitation regime.: Yes Daily analysis of laboratory values and/or radiology reports with any subsequent need for medication adjustment of medical  intervention for : Pulmonary problems;Other  Eivin Mascio, [place] 08/17/2017, 10:18 PM

## 2017-08-17 NOTE — Plan of Care (Signed)
  Problem: Consults Goal: RH GENERAL PATIENT EDUCATION Description See Patient Education module for education specifics. Outcome: Progressing   Problem: RH BLADDER ELIMINATION Goal: RH STG MANAGE BLADDER WITH ASSISTANCE Description STG Manage Bladder With Min Assistance  Outcome: Progressing   Problem: RH SKIN INTEGRITY Goal: RH STG SKIN FREE OF INFECTION/BREAKDOWN Description No new breakdown with Min assist.  Outcome: Progressing   Problem: RH PAIN MANAGEMENT Goal: RH STG PAIN MANAGED AT OR BELOW PT'S PAIN GOAL Description Less than 3 out of 10.  Outcome: Progressing

## 2017-08-17 NOTE — Progress Notes (Signed)
Occupational Therapy Session Note  Patient Details  Name: Dana Abbott MRN: [id number] Date of Birth: 12-19-1965  Today's Date: 08/17/2017 OT Individual Time: 5694-3700 OT Individual Time Calculation (min): 40 min    Short Term Goals: Week 1:  OT Short Term Goal 1 (Week 1): Pt will bathe UB maintaining O2 sats greater than 90% OT Short Term Goal 1 - Progress (Week 1): Met OT Short Term Goal 2 (Week 1): Pt will sit to stand wiht MIN A OT Short Term Goal 2 - Progress (Week 1): Met OT Short Term Goal 3 (Week 1): Pt will transfer to [facility] wiht touching A and LRAD OT Short Term Goal 3 - Progress (Week 1): Met OT Short Term Goal 4 (Week 1): Pt will bathe UB wiht touching A and AE PRN OT Short Term Goal 4 - Progress (Week 1): Met  Skilled Therapeutic Interventions/Progress Updates:    1:1. Pt completes ambulation in hallway per request to work on clearing L foot from floor with RW. Pt requires VC for looking ahead while walking. Pt ambualtes ~50 feet with 1 seated rest break. Pt completes seated LE exercises tapping B feet on large beach ball for hip flexion/adduction alternating feet and tapping same beachball laterally for abduction work to strengthen BLE in prep for improve ambulation for transfers onto BSC/TTB. Exited session with pt seated EOB, call light in reach and all needs met   Therapy Documentation Precautions:  Precautions Precautions: Fall Precaution Comments: monitor O2 sats, desatted on Room Air Restrictions Weight Bearing Restrictions: No  See Function Navigator for Current Functional Status.   Therapy/Group: Individual Therapy  Tonny Branch 08/17/2017, 10:27 AM

## 2017-08-17 NOTE — Progress Notes (Signed)
Occupational Therapy Weekly Progress Note  Patient Details  Name: Dana Abbott MRN: [id number] Date of Birth: 07/08/1965  Beginning of progress report period: August 10, 2017 End of progress report period: August 17, 2017  Today's Date: 08/17/2017 OT Individual Time: 1025-8527 and 1500-1530 OT Individual Time Calculation (min): 55 min and 30 min   Patient has met 4 of 4 short term goals.  Pt is making steady progress towards goals.  Pt currently supervision with short distance ambulation and functional transfers with Rollator.  Pt currently requires setup assist for bathing and toileting tasks due to decreased endurance and body habitus.  Pt continues to be limited by decreased O2 endurance and RLE pain with mobility.    Patient continues to demonstrate the following deficits: muscle weakness, decreased cardiorespiratoy endurance and decreased oxygen support and decreased standing balance, decreased postural control and decreased balance strategies and therefore will continue to benefit from skilled OT intervention to enhance overall performance with BADL, iADL and Reduce care partner burden.  Patient progressing toward long term goals..  Continue plan of care.  OT Short Term Goals Week 1:  OT Short Term Goal 1 (Week 1): Pt will bathe UB maintaining O2 sats greater than 90% OT Short Term Goal 1 - Progress (Week 1): Met OT Short Term Goal 2 (Week 1): Pt will sit to stand wiht MIN A OT Short Term Goal 2 - Progress (Week 1): Met OT Short Term Goal 3 (Week 1): Pt will transfer to [facility] wiht touching A and LRAD OT Short Term Goal 3 - Progress (Week 1): Met OT Short Term Goal 4 (Week 1): Pt will bathe UB wiht touching A and AE PRN OT Short Term Goal 4 - Progress (Week 1): Met Week 2:  OT Short Term Goal 1 (Week 2): STG = LTGs due to remaining LOS  Skilled Therapeutic Interventions/Progress Updates:    1) Treatment session with focus on functional transfers, dynamic standing balance, and  increased endurance.  Pt received seated EOB reporting need to toilet.  Pt ambulated to bathroom with Rollator with supervision, completing transfer onto toilet supervision/setup to place stool under Lt foot for support.  Pt completed toileting Mod I.  Engaged in bathing and dressing at sit > stand level at sink with setup for items.  Pt demonstrating improved standing balance while pulling pants over hips and audibly processing sequence of donning pants.  Pt ambulated 25' and 28' with RW with seated rest break between with focus on increasing activity tolerance and endurance.  Pt returned to room via w/c and completed transfer to bed with RW and supervision.  2) Treatment session with focus on activity tolerance and BUE strengthening.  Pt received seated EOB reporting pain in BLE due to increased activity during PT session requesting to work on UE.  Engaged in zoomball activity in sitting with focus on BUE strength and endurance.  Increased challenge to completing task with arms overhead and alternating diagonals.  Completed 20-30 reps of each, until fatigue.  Pt left seated EOB with all needs in reach.  Therapy Documentation Precautions:  Precautions Precautions: Fall Precaution Comments: monitor O2 sats, desatted on Room Air Restrictions Weight Bearing Restrictions: No General:   Vital Signs: Therapy Vitals Temp: 97.9 F (36.6 C) Temp Source: Oral Pulse Rate: 72 Resp: 18 BP: (!) 97/56 Patient Position (if appropriate): Lying Oxygen Therapy SpO2: 98 % O2 Device: Nasal Cannula O2 Flow Rate (L/min): 2 L/min Pain:  Pt with c/o pain in LLE, however expressing desire  to "work with it".  See Function Navigator for Current Functional Status.   Therapy/Group: Individual Therapy  Simonne Come 08/17/2017, 7:26 AM

## 2017-08-17 NOTE — Progress Notes (Signed)
[place] PHYSICAL MEDICINE & REHABILITATION     PROGRESS NOTE  Subjective/Complaints:  Patient seen sitting up at the edge of her bed this morning. She states she slept well overnight. She denies complaints.  ROS: denies CP, SOB, N/V/D  Objective: Vital Signs: Blood pressure (!) 97/56, pulse 72, temperature 97.9 F (36.6 C), temperature source Oral, resp. rate 18, height 4\' 9"  (1.448 m), weight (!) 156.4 kg (344 lb 12.8 oz), SpO2 98 %. No results found. Recent Labs    08/17/17 0625  WBC 3.0*  HGB 9.5*  HCT 34.4*  PLT 195   Recent Labs    08/16/17 0553 08/17/17 0625  NA  --  137  K  --  3.8  CL  --  103  GLUCOSE  --  84  BUN  --  5*  CREATININE 0.38* 0.36*  CALCIUM  --  8.3*   CBG (last 3)  No results for input(s): GLUCAP in the last 72 hours.  Wt Readings from Last 3 Encounters:  08/17/17 (!) 156.4 kg (344 lb 12.8 oz)  08/09/17 (!) 159 kg (350 lb 8.5 oz)  11/02/15 (!) 167.4 kg (369 lb)    Physical Exam:  BP (!) 97/56 (BP Location: Left Arm)   Pulse 72   Temp 97.9 F (36.6 C) (Oral)   Resp 18   Ht 4\' 9"  (1.448 m)   Wt (!) 156.4 kg (344 lb 12.8 oz)   SpO2 98%   BMI 74.61 kg/m  Constitutional: No distress . Vital signs reviewed. HEENT: EOMI, oral membranes moist Cardiovascular: RRR. No JVD    Respiratory: CTA Bilaterally.  Normal effort. +[place] GI: BS +, non-distended  Musculoskeletal:Right knee with mild (?improving) Lymphedema b/l LE Neurological: She isalertand oriented.  Motor: 5/5 bilateral deltoid bicep tricep grip  3+/5 hip flexion, knee extension, 4+/5 at the ankle dorsiflexors (unchanged, limited by body habitus).  Skin: Skin iswarmand dry. Intact. Psych: pleasant and cooperative.   Assessment/Plan: 1. Functional deficits secondary to debility which require 3+ hours per day of interdisciplinary therapy in a comprehensive inpatient rehab setting. Physiatrist is providing close team supervision and 24 hour management of active medical  problems listed below. Physiatrist and rehab team continue to assess barriers to discharge/monitor patient progress toward functional and medical goals.  Function:  Bathing Bathing position   Position: Wheelchair/chair at sink  Bathing parts Body parts bathed by patient: Right arm, Left arm, Chest, Abdomen, Front perineal area, Right upper leg, Left upper leg, Buttocks Body parts bathed by helper: Buttocks, Right lower leg, Left lower leg, Back  Bathing assist Assist Level: Set up   Set up : To obtain items  Upper Body Dressing/Undressing Upper body dressing   What is the patient wearing?: Pull over shirt/dress Bra - Perfomed by patient: Thread/unthread right bra strap, Hook/unhook bra (pull down sports bra) Bra - Perfomed by helper: Thread/unthread left bra strap Pull over shirt/dress - Perfomed by patient: Thread/unthread right sleeve, Thread/unthread left sleeve, Put head through opening, Pull shirt over trunk          Upper body assist Assist Level: Set up      Lower Body Dressing/Undressing Lower body dressing   What is the patient wearing?: Underwear Underwear - Performed by patient: Thread/unthread right underwear leg, Thread/unthread left underwear leg, Pull underwear up/down       Non-skid slipper socks- Performed by patient: Don/doff right sock, Don/doff left sock   Socks - Performed by patient: Don/doff right sock, Don/doff left sock  Lower body assist Assist for lower body dressing: Set up      Toileting Toileting   Toileting steps completed by patient: Adjust clothing prior to toileting, Performs perineal hygiene, Adjust clothing after toileting Toileting steps completed by helper: Performs perineal hygiene Toileting Assistive Devices: Grab bar or rail  Toileting assist Assist level: Set up/obtain supplies   Transfers Chair/bed transfer   Chair/bed transfer method: Ambulatory Chair/bed transfer assist level: Supervision or verbal  cues Chair/bed transfer assistive device: Armrests, Patent attorney     Max distance: 25' Assist level: Touching or steadying assistance (Pt > 75%)   Wheelchair   Type: Manual Max wheelchair distance: 51ft Assist Level: Supervision or verbal cues  Cognition Comprehension Comprehension assist level: Follows complex conversation/direction with no assist  Expression Expression assist level: Expresses complex ideas: With no assist  Social Interaction Social Interaction assist level: Interacts appropriately with others - No medications needed.  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 DVT Prophylaxis/Anticoagulation: Lovenox. Monitor for any bleeding episodes 3. Pain Management:Voltaren gel 3 times daily, Mobic 15 mg daily, hydrocodone as needed 4. Mood:Provide emotional support 5. Neuropsych: This patientiscapable of making decisions on herown behalf. 6. Skin/Wound Care:Routine skin checks 7. Fluids/Electrolytes/Nutrition:Routine I/O's   BMP within acceptable range on 4/26 8. Acute on chronic diastolic CHF: Weight daily. Heart healthy diet.  Demadex increased to 40 mg twice a day on 4/26.   Potassium supplement increased on 4/26  Follow for consistent pattern, I/Os  Filed Weights   08/15/17 0400 08/16/17 0451 08/17/17 0500  Weight: (!) 154.7 kg (341 lb 0.8 oz) (!) 155.2 kg (342 lb 2.5 oz) (!) 156.4 kg (344 lb 12.8 oz)   9. Severe pulmonary HTN: with hypoxia:Monitor with increased mobility 10. LLL JME:[id number] course of Omnicef 11. Anemia of chronic disease with B12 and iron deficiency:    Has been non-complianat with iron/B 12 injections. Monitor with serial checks.  Educate on importance of medical compliance.    Continue iron supplement TID, changed B12 injections to po   Hb 9.5 on 4/26   Cont to monitor 12. Severe right knee OA: Continue Mobic, ice 13. 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 Laxative assistance 15. Supplemental oxygen dependent   Was using 1.5L PRN, which started 1 week PTA  Wean as possible, currently on 2 L.   LOS (Days) 8 A FACE TO FACE EVALUATION WAS PERFORMED  Draden Cottingham Karis Juba 08/17/2017 9:02 AM

## 2017-08-18 ENCOUNTER — Inpatient Hospital Stay (HOSPITAL_COMMUNITY): Payer: Medicaid Other | Admitting: Occupational Therapy

## 2017-08-18 NOTE — Progress Notes (Signed)
Occupational Therapy Session Note  Patient Details  Name: Dana Abbott MRN: [id number] Date of Birth: [birth year]  Today's Date: 08/18/2017 OT Individual Time: 7125-2712 OT Individual Time Calculation (min): 48 min    Skilled Therapeutic Interventions/Progress Updates: patient stated, "I should be off today if I am off tomorrow, but I will do something since you all have me scheduled."   She asked to sit edge of bed for exercises (demonstrated fair dynamic sitting balance unsupported), as well as endurance activities.   She completed bed to toilet transfer via the rollator with close S.   She asked this clinician to 'give me space.  I am fine' as this clinician proceeded to walk close by her.  She was left seated on the bathroom toilet as she requested.   The nurse tech was in hallway and informed that patient requested (and that patient stated she is typically left alone on toilet to 'take care of her business.) she be left alone on toilet and that she would pull her call bell when she was ready to get up.         Therapy Documentation Precautions:  Precautions Precautions: Fall Precaution Comments: monitor O2 sats, desatted on Room Air Restrictions Weight Bearing Restrictions: No   Pain:right knee with movjements but she did not rate.   She stated, "It is arthritis."     Therapy/Group: Individual Therapy  Bud Face [facility] 08/18/2017, 4:53 PM

## 2017-08-18 NOTE — Progress Notes (Signed)
[place] PHYSICAL MEDICINE & REHABILITATION     PROGRESS NOTE  Subjective/Complaints:  Patient up in bed and on the phone when I arrived.  No new complaints.  Feels that she is making progress in therapy  ROS: Patient denies fever, rash, sore throat, blurred vision, nausea, vomiting, diarrhea, cough, shortness of breath or chest pain, joint or back pain, headache, or mood change.   Objective: Vital Signs: Blood pressure (!) 104/54, pulse 84, temperature 97.8 F (36.6 C), temperature source Oral, resp. rate 18, height 4\' 9"  (1.448 m), weight (!) 156.5 kg (345 lb 0.3 oz), SpO2 100 %. No results found. Recent Labs    08/17/17 0625  WBC 3.0*  HGB 9.5*  HCT 34.4*  PLT 195   Recent Labs    08/16/17 0553 08/17/17 0625  NA  --  137  K  --  3.8  CL  --  103  GLUCOSE  --  84  BUN  --  5*  CREATININE 0.38* 0.36*  CALCIUM  --  8.3*   CBG (last 3)  No results for input(s): GLUCAP in the last 72 hours.  Wt Readings from Last 3 Encounters:  08/18/17 (!) 156.5 kg (345 lb 0.3 oz)  08/09/17 (!) 159 kg (350 lb 8.5 oz)  11/02/15 (!) 167.4 kg (369 lb)    Physical Exam:  BP (!) 104/54 (BP Location: Left Arm)   Pulse 84   Temp 97.8 F (36.6 C) (Oral)   Resp 18   Ht 4\' 9"  (1.448 m)   Wt (!) 156.5 kg (345 lb 0.3 oz)   SpO2 100%   BMI 74.66 kg/m  Constitutional: No distress . Vital signs reviewed. HEENT: EOMI, oral membranes moist Neck: supple Cardiovascular: RRR without murmur. No JVD    Respiratory: CTA Bilaterally without wheezes or rales. Normal effort . [place] [facility]: BS +, non-tender, non-distended  Musculoskeletal:Right knee with mild (?improving) Lymphedema b/l LE Neurological: She isalertand oriented.  Motor: 5/5 bilateral deltoid bicep tricep grip  3+/5 hip flexion, knee extension, 4+/5 at the ankle dorsiflexors --stable exam Skin: Skin iswarmand dry. Intact. Psych: pleasant and cooperative.   Assessment/Plan: 1. Functional deficits secondary to debility which  require 3+ hours per day of interdisciplinary therapy in a comprehensive inpatient rehab setting. Physiatrist is providing close team supervision and 24 hour management of active medical problems listed below. Physiatrist and rehab team continue to assess barriers to discharge/monitor patient progress toward functional and medical goals.  Function:  Bathing Bathing position   Position: Wheelchair/chair at sink  Bathing parts Body parts bathed by patient: Right arm, Left arm, Chest, Abdomen, Front perineal area, Right upper leg, Left upper leg, Buttocks Body parts bathed by helper: Buttocks, Right lower leg, Left lower leg, Back  Bathing assist Assist Level: Set up   Set up : To obtain items  Upper Body Dressing/Undressing Upper body dressing   What is the patient wearing?: Pull over shirt/dress Bra - Perfomed by patient: Thread/unthread right bra strap, Hook/unhook bra (pull down sports bra) Bra - Perfomed by helper: Thread/unthread left bra strap Pull over shirt/dress - Perfomed by patient: Thread/unthread right sleeve, Thread/unthread left sleeve, Put head through opening, Pull shirt over trunk          Upper body assist Assist Level: Set up      Lower Body Dressing/Undressing Lower body dressing   What is the patient wearing?: Pants Underwear - Performed by patient: Thread/unthread right underwear leg, Thread/unthread left underwear leg, Pull underwear up/down  Pants- Performed by patient: Thread/unthread right pants leg, Thread/unthread left pants leg, Pull pants up/down   Non-skid slipper socks- Performed by patient: Don/doff right sock, Don/doff left sock   Socks - Performed by patient: Don/doff right sock, Don/doff left sock                Lower body assist Assist for lower body dressing: Set up      Toileting Toileting   Toileting steps completed by patient: Adjust clothing prior to toileting, Performs perineal hygiene, Adjust clothing after  toileting Toileting steps completed by helper: Performs perineal hygiene Toileting Assistive Devices: Grab bar or rail  Toileting assist Assist level: Set up/obtain supplies   Transfers Chair/bed transfer   Chair/bed transfer method: Ambulatory Chair/bed transfer assist level: Supervision or verbal cues Chair/bed transfer assistive device: Armrests, Patent attorney     Max distance: 25' Assist level: Touching or steadying assistance (Pt > 75%)   Wheelchair   Type: Manual Max wheelchair distance: 71ft Assist Level: Supervision or verbal cues  Cognition Comprehension Comprehension assist level: Follows complex conversation/direction with no assist  Expression Expression assist level: Expresses complex ideas: With no assist  Social Interaction Social Interaction assist level: Interacts appropriately with others - No medications needed.  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 DVT Prophylaxis/Anticoagulation: Lovenox. Monitor for any bleeding episodes 3. Pain Management:Voltaren gel 3 times daily, Mobic 15 mg daily, hydrocodone as needed 4. Mood:Provide emotional support 5. Neuropsych: This patientiscapable of making decisions on herown behalf. 6. Skin/Wound Care:Routine skin checks 7. Fluids/Electrolytes/Nutrition:Routine I/O's   BMP within acceptable range on 4/26 8. Acute on chronic diastolic CHF: Weight daily. Heart healthy diet.  Demadex increased to 40 mg twice a day on 4/26.   Potassium supplement increased on 4/26    Filed Weights   08/16/17 0451 08/17/17 0500 08/18/17 0426  Weight: (!) 155.2 kg (342 lb 2.5 oz) (!) 156.4 kg (344 lb 12.8 oz) (!) 156.5 kg (345 lb  0.3 oz)    -Weights appear grossly stable, continue to monitor 9. Severe pulmonary HTN: with hypoxia:Monitor with increased mobility 10. LLL ZSM:[id number] course of Omnicef 11. Anemia of chronic disease with B12 and iron deficiency:    Has been non-complianat with iron/B 12 injections. Monitor with serial checks. Educate on importance of medical compliance.    Continue iron supplement TID, changed B12 injections to po   Hb 9.5 on 4/26   Cont to monitor 12. Severe right knee OA: Continue Mobic, ice 13. 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 Laxative assistance 15. Supplemental oxygen dependent   Was using 1.5L PRN, which started 1 week PTA  Wean as possible, currently on 2 L.   LOS (Days) 9 A FACE TO FACE EVALUATION WAS PERFORMED  Ranelle Oyster 08/18/2017 8:31 AM

## 2017-08-19 NOTE — Progress Notes (Signed)
[place] PHYSICAL MEDICINE & REHABILITATION     PROGRESS NOTE  Subjective/Complaints:  Pt at eob. Complains of knee pain/"popping". Doesn't hurt until she feels popping, can come and go quickly  ROS: Patient denies fever, rash, sore throat, blurred vision, nausea, vomiting, diarrhea, cough, shortness of breath or chest pain, joint or back pain, headache, or mood change.    Objective: Vital Signs: Blood pressure 106/73, pulse 76, temperature 97.7 F (36.5 C), temperature source Oral, resp. rate 18, height 4\' 9"  (1.448 m), weight (!) 154 kg (339 lb 8.1 oz), SpO2 100 %. No results found. Recent Labs    08/17/17 0625  WBC 3.0*  HGB 9.5*  HCT 34.4*  PLT 195   Recent Labs    08/17/17 0625  NA 137  K 3.8  CL 103  GLUCOSE 84  BUN 5*  CREATININE 0.36*  CALCIUM 8.3*   CBG (last 3)  No results for input(s): GLUCAP in the last 72 hours.  Wt Readings from Last 3 Encounters:  08/19/17 (!) 154 kg (339 lb 8.1 oz)  08/09/17 (!) 159 kg (350 lb 8.5 oz)  11/02/15 (!) 167.4 kg (369 lb)    Physical Exam:  BP 106/73 (BP Location: Left Arm)   Pulse 76   Temp 97.7 F (36.5 C) (Oral)   Resp 18   Ht 4\' 9"  (1.448 m)   Wt (!) 154 kg (339 lb 8.1 oz)   SpO2 100%   BMI 73.47 kg/m  Constitutional: No distress . Vital signs reviewed. HEENT: EOMI, oral membranes moist Neck: supple Cardiovascular: RRR without murmur. No JVD    Respiratory: CTA Bilaterally without wheezes or rales. Normal effort    GI: BS +, non-tender, non-distended  Musculoskeletal:Right knee crepitus Lymphedema b/l LE Neurological: She isalertand oriented.  Motor: 5/5 bilateral deltoid bicep tricep grip  3+/5 hip flexion, knee extension, 4+/5 at the ankle dorsiflexors -stable Skin: Skin iswarmand dry. Intact. Psych: pleasant and cooperative.   Assessment/Plan: 1. Functional deficits secondary to debility which require 3+ hours per day of interdisciplinary therapy in a comprehensive inpatient rehab  setting. Physiatrist is providing close team supervision and 24 hour management of active medical problems listed below. Physiatrist and rehab team continue to assess barriers to discharge/monitor patient progress toward functional and medical goals.  Function:  Bathing Bathing position   Position: Wheelchair/chair at sink  Bathing parts Body parts bathed by patient: Right arm, Left arm, Chest, Abdomen, Front perineal area, Right upper leg, Left upper leg, Buttocks Body parts bathed by helper: Buttocks, Right lower leg, Left lower leg, Back  Bathing assist Assist Level: Set up   Set up : To obtain items  Upper Body Dressing/Undressing Upper body dressing   What is the patient wearing?: Pull over shirt/dress Bra - Perfomed by patient: Thread/unthread right bra strap, Hook/unhook bra (pull down sports bra) Bra - Perfomed by helper: Thread/unthread left bra strap Pull over shirt/dress - Perfomed by patient: Thread/unthread right sleeve, Thread/unthread left sleeve, Put head through opening, Pull shirt over trunk          Upper body assist Assist Level: Set up      Lower Body Dressing/Undressing Lower body dressing   What is the patient wearing?: Pants Underwear - Performed by patient: Thread/unthread right underwear leg, Thread/unthread left underwear leg, Pull underwear up/down   Pants- Performed by patient: Thread/unthread right pants leg, Thread/unthread left pants leg, Pull pants up/down   Non-skid slipper socks- Performed by patient: Don/doff right sock, Don/doff left sock  Socks - Performed by patient: Don/doff right sock, Don/doff left sock                Lower body assist Assist for lower body dressing: Set up      Toileting Toileting   Toileting steps completed by patient: Adjust clothing prior to toileting, Performs perineal hygiene, Adjust clothing after toileting Toileting steps completed by helper: Performs perineal hygiene Toileting Assistive Devices:  Grab bar or rail  Toileting assist Assist level: Set up/obtain supplies   Transfers Chair/bed transfer   Chair/bed transfer method: Ambulatory Chair/bed transfer assist level: Supervision or verbal cues Chair/bed transfer assistive device: Armrests, Patent attorney     Max distance: 25' Assist level: Touching or steadying assistance (Pt > 75%)   Wheelchair   Type: Manual Max wheelchair distance: 75ft Assist Level: Supervision or verbal cues  Cognition Comprehension Comprehension assist level: Follows complex conversation/direction with extra time/assistive device  Expression Expression assist level: Expresses complex ideas: With extra time/assistive device  Social Interaction Social Interaction assist level: Interacts appropriately with others with medication or extra time (anti-anxiety, antidepressant).  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 DVT Prophylaxis/Anticoagulation: Lovenox. Monitor for any bleeding episodes 3. Pain Management:Voltaren gel 3 times daily, Mobic 15 mg daily, hydrocodone as needed 4. Mood:Provide emotional support 5. Neuropsych: This patientiscapable of making decisions on herown behalf. 6. Skin/Wound Care:Routine skin checks 7. Fluids/Electrolytes/Nutrition:Routine I/O's   BMP within acceptable range on 4/26 8. Acute on chronic diastolic CHF: Weight daily. Heart healthy diet.  Demadex increased to 40 mg twice a day on 4/26.   Potassium supplement increased on 4/26    Filed Weights   08/18/17 0426 08/19/17 0500 08/19/17 0527  Weight: (!) 156.5 kg (345 lb 0.3 oz) (!) 156.5 kg (345 lb 0.3 oz) (!) 154 kg (339 lb 8.1 oz)    -Weights appear grossly  stable, continue to monitor 9. Severe pulmonary HTN: with hypoxia:Monitor with increased mobility 10. LLL HQP:[id number] course of Omnicef 11. Anemia of chronic disease with B12 and iron deficiency:    Has been non-complianat with iron/B 12 injections. Monitor with serial checks. Educate on importance of medical compliance.    Continue iron supplement TID, changed B12 injections to po   Hb 9.5 on 4/26   Cont to monitor 12. Severe right knee OA: reviewed potentially etiology of knee pain. Pt acted as if it was completely new information   -Continue Mobic, voltaren  -schedule ice 13. 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 Laxative assistance 15. Supplemental oxygen dependent   Was using 1.5L PRN, which started 1 week PTA  Wean as possible, currently on 2 L.   LOS (Days) 10 A FACE TO FACE EVALUATION WAS PERFORMED  Ranelle Oyster 08/19/2017 8:57 AM

## 2017-08-19 NOTE — Plan of Care (Signed)
Continues to c/o BLE ( knees) hurting.

## 2017-08-20 ENCOUNTER — Inpatient Hospital Stay (HOSPITAL_COMMUNITY): Payer: Medicaid Other | Admitting: Occupational Therapy

## 2017-08-20 ENCOUNTER — Inpatient Hospital Stay (HOSPITAL_COMMUNITY): Payer: Medicaid Other | Admitting: Physical Therapy

## 2017-08-20 DIAGNOSIS — D72819 Decreased white blood cell count, unspecified: Secondary | ICD-10-CM

## 2017-08-20 NOTE — Plan of Care (Signed)
  Problem: Consults Goal: RH GENERAL PATIENT EDUCATION Description See Patient Education module for education specifics. Outcome: Progressing   Problem: RH BLADDER ELIMINATION Goal: RH STG MANAGE BLADDER WITH ASSISTANCE Description STG Manage Bladder With Min Assistance  Outcome: Progressing Flowsheets (Taken 08/20/2017 1455) STG: Pt will manage bladder with assistance: 6-Modified independent   Problem: RH SKIN INTEGRITY Goal: RH STG SKIN FREE OF INFECTION/BREAKDOWN Description No new breakdown with Min assist.  Outcome: Progressing   Problem: RH PAIN MANAGEMENT Goal: RH STG PAIN MANAGED AT OR BELOW PT'S PAIN GOAL Description Less than 3 out of 10.  Outcome: Progressing

## 2017-08-20 NOTE — Progress Notes (Signed)
Occupational Therapy Session Note  Patient Details  Name: Dana Abbott MRN: [id number] Date of Birth: Mar 06, 1966  Today's Date: 08/20/2017 OT Individual Time: 1121-6244 OT Individual Time Calculation (min): 58 min    Short Term Goals: Week 2:  OT Short Term Goal 1 (Week 2): STG = LTGs due to remaining LOS  Skilled Therapeutic Interventions/Progress Updates:    Pt received EOB. Pt agreeable to therapy. She stated her main goal is to be able to climb stairs.  Pt stood and transferred to w/c using RW with close S.  Pt taken to gym and O2 lowered from 1.5 to 1L.  O2 sats fluctuated between 94% - 98%.  Pt stated she felt comfortable on 1L.   Pt taken to gym and chair placed at end of low stairs.  Pt was able to step R foot up and down 10x but could not lift L foot onto step.  Pt rested briefly and then stood and worked on wt shifting from L >< R and gradually worked towards shifting R to elevate L heel.   She then worked on AROM of LLE from sitting with leg placed on low bench.  Pt did active hip abd/ add, knee flex/ext (small range of motion), and heel flex/ext to work on strength of muscles surrounding her knee joint. Pt stated she could really feel the muscles working but had no joint pain. She also practiced AROM with legs in dependent position with knee and ankle circles.  Pt encouraged to do these exercises frequently on her own from the EOB.   Pt taken back to the room and she transferred back to EOB to prepare for lunch. Pt in room with all needs met.  Therapy Documentation Precautions:  Precautions Precautions: Fall Precaution Comments: monitor O2 sats, desatted on Room Air Restrictions Weight Bearing Restrictions: No    Pain: Pain Assessment Pain Scale: 0-10 Pain Score: 2  Pain Intervention(s): Repositioned    ADL:  See Function Navigator for Current Functional Status.   Therapy/Group: Individual Therapy  SAGUIER,JULIA 08/20/2017, 12:59 PM

## 2017-08-20 NOTE — Progress Notes (Signed)
Occupational Therapy Session Note  Patient Details  Name: Dana Abbott MRN: [id number] Date of Birth: 1966-03-03  Today's Date: 08/20/2017 OT Individual Time: 0900-1000 OT Individual Time Calculation (min): 60 min    Short Term Goals: Week 2:  OT Short Term Goal 1 (Week 2): STG = LTGs due to remaining LOS  Skilled Therapeutic Interventions/Progress Updates:    Treatment session with focus on increased independence with ADL tasks and overall strength and endurance.  Pt received seated EOB reporting just recently waking up and feeling more weak and stiff than normal.  Pt ambulated to toilet with Rollator with supervision, completed toileting tasks and transfer out of bathroom at Mod I level.  Pt completed bathing at sit > stand level at sink with setup of items.  Discussed home setup with tub/shower and tub transfer bench with plan to practice tub transfer prior to d/c home.  Pt voicing concerns with tub bench due to weight, reassured pt there is a bariatric tub bench available.  O2 dropped to 87% on room air during bathing, however able to return to 90 with pursed lip breathing at rest.  Donned 1.5L O2 during activity.  Pt initially expressed desire to work on ambulation for increased endurance, however reports "too weak" this session and requesting to address UB strength.  Pt completed 2 sets of 10 chest pulls, bicep curls, and PNF pattern diagonals with level 2 theraband for strengthening and endurance.  Pt left seated EOB with all needs in reach.  Therapy Documentation Precautions:  Precautions Precautions: Fall Precaution Comments: monitor O2 sats, desatted on Room Air Restrictions Weight Bearing Restrictions: No Pain: Pain Assessment Pain Scale: 0-10 Pain Score: 6  Pain Type: Chronic pain Pain Location: Knee Pain Orientation: Right Pain Frequency: Constant Pain Intervention(s): Medication (See eMAR)  See Function Navigator for Current Functional Status.   Therapy/Group:  Individual Therapy  Rosalio Loud 08/20/2017, 10:09 AM

## 2017-08-20 NOTE — Progress Notes (Signed)
Physical Therapy Session Note  Patient Details  Name: Dana Abbott MRN: [id number] Date of Birth: 1965-12-03  Today's Date: 08/20/2017 PT Individual Time: 2330-0762 PT Individual Time Calculation (min): 80 min   Short Term Goals: Week 2:  PT Short Term Goal 1 (Week 2): STG=LTG due to ELOS   Skilled Therapeutic Interventions/Progress Updates:  Pt presented sitting EOb agreeable to therapy. Pt c/o feeling "stiff" as pt indicated did not have therapies yesterday. Encouraged pt to ambulate, performed with RW x 8 ft then pt stating unable to ambulate further due to "weakness" and increased R knee pain. Pt transferred to w/c and transported to rehab gym. Participated in standing tolerance activities playing checkers at high low table with standing tolerance varying 1:30, 2:00, and 1:45 respectively. Pt noted to improve transfer by pushing up from w/c 2/3 attempts and noted to have B feet flat on floor during transfer. Pt monitored during activity on 1L SpO2 maintained >95% during standing trials. Pt transported to day room and participated in Cybex Kinetron 70cm/sec 1 min x 4 titrating supplemental O2 to .5L. SpO2 decreased to 92% after activity with pt indicating no SOB. Pt transported to rehab gym to attempt step in parallel bars, pt able to place RLE on 4in step and wt shift onto step but unable to place LLE on step x 3 attempts. Pt then c/o increased knee pain, pt returned to room and nsg notified of increased pain. Pt and PTA discussed options for progression of ambulation and endurance at home. Per pt has [facility] membership to [facility] and pool has zero entry access. Adv benefits of aquatics/increased buoyancy, etc and adv will provide appropriate aquatic exercises, pt pleased with this option. Pt transferred to bed and left at EOB with call bell within reach and needs met.      Therapy Documentation Precautions:  Precautions Precautions: Fall Precaution Comments: monitor O2 sats, desatted on Room  Air Restrictions Weight Bearing Restrictions: No General:   Vital Signs: Therapy Vitals Temp: 97.9 F (36.6 C) Temp Source: Oral Pulse Rate: 71 Resp: 16 BP: 109/71 Patient Position (if appropriate): Sitting Oxygen Therapy SpO2: 100 % O2 Device: Room Air O2 Flow Rate (L/min): 1.5 L/min FiO2 (%): 100 % Pain: Pain Assessment Pain Scale: 0-10 Pain Score: 5  Pain Intervention(s): Medication (See eMAR);Repositioned Mobility:   Locomotion :    Trunk/Postural Assessment :    Balance:   Exercises:   Other Treatments:     See Function Navigator for Current Functional Status.   Therapy/Group: Individual Therapy  Dary Dilauro 08/20/2017, 4:09 PM

## 2017-08-20 NOTE — Progress Notes (Signed)
[place] PHYSICAL MEDICINE & REHABILITATION     PROGRESS NOTE  Subjective/Complaints:  Pt seen lying in bed this AM.  She states she slept well overnight and had a good weekend.   ROS: Denies CP, SOB, N/V/D  Objective: Vital Signs: Blood pressure (!) 78/54, pulse 71, temperature 98.6 F (37 C), temperature source Oral, resp. rate 18, height 4\' 9"  (1.448 m), weight (!) 153.8 kg (339 lb), SpO2 94 %. No results found. No results for input(s): WBC, HGB, HCT, PLT in the last 72 hours. No results for input(s): NA, K, CL, GLUCOSE, BUN, CREATININE, CALCIUM in the last 72 hours.  Invalid input(s): CO CBG (last 3)  No results for input(s): GLUCAP in the last 72 hours.  Wt Readings from Last 3 Encounters:  08/20/17 (!) 153.8 kg (339 lb)  08/09/17 (!) 159 kg (350 lb 8.5 oz)  11/02/15 (!) 167.4 kg (369 lb)    Physical Exam:  BP (!) 78/54 (BP Location: Left Arm)   Pulse 71   Temp 98.6 F (37 C) (Oral)   Resp 18   Ht 4\' 9"  (1.448 m)   Wt (!) 153.8 kg (339 lb)   SpO2 94%   BMI 73.36 kg/m  Constitutional: No distress . Vital signs reviewed. HENT: Normocephalic.  Atraumatic.  Eyes: EOMI. No discharge.  Cardiovascular: RRR. No JVD    Respiratory: CTA Bilaterally. Normal effort    GI: BS +, non-distended  Musculoskeletal:Right TTP Lymphedema b/l LE Neurological: She isalertand oriented.  Motor: 5/5 bilateral deltoid bicep tricep grip  3+/5 hip flexion, knee extension, 4+/5 at the ankle dorsiflexors (Unchanged) Skin: Skin iswarmand dry. Intact. Psych: pleasant and cooperative.   Assessment/Plan: 1. Functional deficits secondary to debility which require 3+ hours per day of interdisciplinary therapy in a comprehensive inpatient rehab setting. Physiatrist is providing close team supervision and 24 hour management of active medical problems listed below. Physiatrist and rehab team continue to assess barriers to discharge/monitor patient progress toward functional and medical  goals.  Function:  Bathing Bathing position   Position: Wheelchair/chair at sink  Bathing parts Body parts bathed by patient: Right arm, Left arm, Chest, Abdomen, Front perineal area, Right upper leg, Left upper leg, Buttocks Body parts bathed by helper: Buttocks, Right lower leg, Left lower leg, Back  Bathing assist Assist Level: Set up   Set up : To obtain items  Upper Body Dressing/Undressing Upper body dressing   What is the patient wearing?: Pull over shirt/dress Bra - Perfomed by patient: Thread/unthread right bra strap, Hook/unhook bra (pull down sports bra) Bra - Perfomed by helper: Thread/unthread left bra strap Pull over shirt/dress - Perfomed by patient: Thread/unthread right sleeve, Thread/unthread left sleeve, Put head through opening, Pull shirt over trunk          Upper body assist Assist Level: Set up      Lower Body Dressing/Undressing Lower body dressing   What is the patient wearing?: Pants Underwear - Performed by patient: Thread/unthread right underwear leg, Thread/unthread left underwear leg, Pull underwear up/down   Pants- Performed by patient: Thread/unthread right pants leg, Thread/unthread left pants leg, Pull pants up/down   Non-skid slipper socks- Performed by patient: Don/doff right sock, Don/doff left sock   Socks - Performed by patient: Don/doff right sock, Don/doff left sock                Lower body assist Assist for lower body dressing: Set up      Toileting Toileting   Toileting steps  completed by patient: Adjust clothing prior to toileting, Adjust clothing after toileting, Performs perineal hygiene Toileting steps completed by helper: Performs perineal hygiene Toileting Assistive Devices: Grab bar or rail  Toileting assist Assist level: No help/no cues   Transfers Chair/bed transfer   Chair/bed transfer method: Ambulatory Chair/bed transfer assist level: Supervision or verbal cues Chair/bed transfer assistive device:  Armrests, Patent attorney     Max distance: 25' Assist level: Touching or steadying assistance (Pt > 75%)   Wheelchair   Type: Manual Max wheelchair distance: 50ft Assist Level: Supervision or verbal cues  Cognition Comprehension Comprehension assist level: Follows complex conversation/direction with no assist  Expression Expression assist level: Expresses complex ideas: With no assist, Expresses complex ideas: With extra time/assistive device  Social Interaction Social Interaction assist level: Interacts appropriately with others - No medications needed.  Problem Solving Problem solving assist level: Solves complex problems: With extra time  Memory Memory assist level: Complete Independence: No helper    Medical Problem List and Plan: 1.Debilitysecondary to acute on chronic disystolic heart failure with underlying chronic respiratory failure  Cont CIR  2. DVT Prophylaxis/Anticoagulation: Lovenox. Monitor for any bleeding episodes 3. Pain Management:Voltaren gel 3 times daily, Mobic 15 mg daily, hydrocodone as needed 4. Mood:Provide emotional support 5. Neuropsych: This patientiscapable of making decisions on herown behalf. 6. Skin/Wound Care:Routine skin checks 7. Fluids/Electrolytes/Nutrition:Routine I/O's   BMP within acceptable range on 4/26 8. Acute on chronic diastolic CHF: Weight daily. Heart healthy diet.  Demadex increased to 40 mg twice a day on 4/26.   Potassium supplement increased on 4/26    Filed Weights   08/19/17 0500 08/19/17 0527 08/20/17 0608  Weight: (!) 156.5 kg (345 lb 0.3 oz) (!) 154 kg (339 lb 8.1 oz) (!) 153.8 kg (339 lb)    Stable on 4/29  Labs ordered for tomorrow 9. Severe pulmonary HTN: with hypoxia:Monitor with increased mobility 10. LLL ESL:PNPYYFRTM course of Omnicef 11. Anemia of chronic disease with B12 and iron deficiency:    Has been non-complianat with iron/B 12 injections. Monitor with serial checks.  Educate on importance of medical compliance.    Continue iron supplement TID, changed B12 injections to po   Hb 9.5 on 4/26   Cont to monitor 12. Severe right knee OA: reviewed potentially etiology of knee pain. Pt acted as if it was completely new information  Continue Mobic, voltaren  Schedule ice 13. 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 Laxative assistance 15. Supplemental oxygen dependent   Was using 1.5L PRN, which started 1 week PTA  Wean as possible, currently on 2 L.  16 Leukopenia  WBCs 3.0 on 4/26  Labs ordered for tomorrow  LOS (Days) 11 A FACE TO FACE EVALUATION WAS PERFORMED  Ankit Karis Juba 08/20/2017 9:13 AM

## 2017-08-21 ENCOUNTER — Inpatient Hospital Stay (HOSPITAL_COMMUNITY): Payer: Medicaid Other | Admitting: Occupational Therapy

## 2017-08-21 ENCOUNTER — Inpatient Hospital Stay (HOSPITAL_COMMUNITY): Payer: Medicaid Other | Admitting: Physical Therapy

## 2017-08-21 DIAGNOSIS — D696 Thrombocytopenia, unspecified: Secondary | ICD-10-CM

## 2017-08-21 LAB — CBC WITH DIFFERENTIAL/PLATELET
BASOS PCT: 0 %
Basophils Absolute: 0 10*3/uL (ref 0.0–0.1)
EOS ABS: 0.1 10*3/uL (ref 0.0–0.7)
EOS PCT: 3 %
HCT: 35.5 % — ABNORMAL LOW (ref 36.0–46.0)
Hemoglobin: 9.7 g/dL — ABNORMAL LOW (ref 12.0–15.0)
LYMPHS ABS: 0.8 10*3/uL (ref 0.7–4.0)
Lymphocytes Relative: 27 %
MCH: 25.5 pg — AB (ref 26.0–34.0)
MCHC: 27.3 g/dL — AB (ref 30.0–36.0)
MCV: 93.4 fL (ref 78.0–100.0)
MONO ABS: 0.2 10*3/uL (ref 0.1–1.0)
Monocytes Relative: 8 %
NEUTROS ABS: 1.9 10*3/uL (ref 1.7–7.7)
Neutrophils Relative %: 62 %
PLATELETS: 98 10*3/uL — AB (ref 150–400)
RBC: 3.8 MIL/uL — ABNORMAL LOW (ref 3.87–5.11)
RDW: 24.5 % — ABNORMAL HIGH (ref 11.5–15.5)
WBC: 3 10*3/uL — ABNORMAL LOW (ref 4.0–10.5)

## 2017-08-21 LAB — BASIC METABOLIC PANEL
Anion gap: 9 (ref 5–15)
BUN: 5 mg/dL — AB (ref 6–20)
CO2: 28 mmol/L (ref 22–32)
CREATININE: 0.35 mg/dL — AB (ref 0.44–1.00)
Calcium: 8.3 mg/dL — ABNORMAL LOW (ref 8.9–10.3)
Chloride: 101 mmol/L (ref 101–111)
GFR calc Af Amer: >60 mL/min (ref >60)
Glucose, Bld: 85 mg/dL (ref 65–99)
POTASSIUM: 4 mmol/L (ref 3.5–5.1)
Sodium: 138 mmol/L (ref 135–145)

## 2017-08-21 LAB — CBC
HEMATOCRIT: 37.4 % (ref 36.0–46.0)
Hemoglobin: 10.5 g/dL — ABNORMAL LOW (ref 12.0–15.0)
MCH: 25.9 pg — ABNORMAL LOW (ref 26.0–34.0)
MCHC: 28.1 g/dL — AB (ref 30.0–36.0)
MCV: 92.3 fL (ref 78.0–100.0)
Platelets: 107 10*3/uL — ABNORMAL LOW (ref 150–400)
RBC: 4.05 MIL/uL (ref 3.87–5.11)
RDW: 24.2 % — AB (ref 11.5–15.5)
WBC: 4.2 10*3/uL (ref 4.0–10.5)

## 2017-08-21 NOTE — Significant Event (Signed)
Patient requests to take demadex once therapy is completed for the day since it makes her go to the bathroom urgently and doesn't want it to interfere with her therapy.  Informed patient she is scheduled to have it twice daily which would interfere with her medication administration times, but she is adamant that she only take it after her therapy is finished for the day.  She states that she has been taking it at 1600 usually but it is documented in the [facility] as given with normal morning medication.  Told patient I could try to get the doctor to make some adjustments so that the order reflects her preferences, however, she said not to because she is going home tomorrow and will be able to take it as prescribed once getting home because she doesn't have an intensive therapy schedule at home.  Dani Gobble, RN

## 2017-08-21 NOTE — Discharge Summary (Signed)
Discharge summary job [phone]

## 2017-08-21 NOTE — Progress Notes (Signed)
Physical Therapy Session Note  Patient Details  Name: Dana Abbott MRN: [id number] Date of Birth: 01-21-1966  Today's Date: 08/21/2017 PT Individual Time: 1045-1200 PT Individual Time Calculation (min): 75 min   Short Term Goals: Week 2: STG = LTG  Skilled Therapeutic Interventions/Progress Updates: Pt presented sitting EOB agreeable to therapy. Pt indicating decreased pain this session as premedicated, no burning sensation as during previous session yesterday. Pt demonstrated bed mobility from flat bed mod I with increased time. Pt agreeable to participate in ambulation for endurance. Pt ambulated distances 18f, 254f 4819fand [id number] with rollator and seated rests. Pt desat to 87% on 1L during 48ft8fulation, discussed with pt energy conservation techniques. Pt transported to rehab gym and participated in seated therex hip flexion, LAQ, knee flexion, ankle pumps to fatigue bilaterally. Pt also instructed and provided aquatic HEP with PTA provided handout on benefits of aquatic activities, effects of buoyancy, and to monitor activity in pool. Pt pleased with handouts. Pt completed functional activities in preparation for d/c as noted in tab. Pt returned to room at end of session and performed ambulatory transfer to bed, pt remained sitting at EOB with call bell within reach and needs met.      Therapy Documentation Precautions:  Precautions Precautions: Fall Precaution Comments: monitor O2 sats, desatted on Room Air Restrictions Weight Bearing Restrictions: No General:   Vital Signs: Therapy Vitals Temp: 97.6 F (36.4 C) Temp Source: Oral Pulse Rate: 71 Resp: (!) 21 BP: 133/82 Patient Position (if appropriate): Sitting Oxygen Therapy SpO2: 99 % O2 Device: Nasal Cannula O2 Flow Rate (L/min): 2 L/min   See Function Navigator for Current Functional Status.   Therapy/Group: Individual Therapy  Glorya Bartley  Orlyn Odonoghue, PTA  08/21/2017, 4:07 PM

## 2017-08-21 NOTE — Progress Notes (Addendum)
[place] PHYSICAL MEDICINE & REHABILITATION     PROGRESS NOTE  Subjective/Complaints:  Pt seen sitting up in bed this AM.  She slept well overnight.  She has questions about discharge pain meds.  ROS: Denies CP, SOB, N/V/D  Objective: Vital Signs: Blood pressure 114/64, pulse 87, temperature 98 F (36.7 C), temperature source Oral, resp. rate 20, height 4\' 9"  (1.448 m), weight (!) 152 kg (335 lb 1.6 oz), SpO2 97 %. No results found. Recent Labs    08/21/17 0723  WBC 3.0*  HGB 9.7*  HCT 35.5*  PLT PENDING   Recent Labs    08/21/17 0723  NA 138  K 4.0  CL 101  GLUCOSE 85  BUN 5*  CREATININE 0.35*  CALCIUM 8.3*   CBG (last 3)  No results for input(s): GLUCAP in the last 72 hours.  Wt Readings from Last 3 Encounters:  08/21/17 (!) 152 kg (335 lb 1.6 oz)  08/09/17 (!) 159 kg (350 lb 8.5 oz)  11/02/15 (!) 167.4 kg (369 lb)    Physical Exam:  BP 114/64 (BP Location: Left Arm)   Pulse 87   Temp 98 F (36.7 C) (Oral)   Resp 20   Ht 4\' 9"  (1.448 m)   Wt (!) 152 kg (335 lb 1.6 oz)   SpO2 97%   BMI 72.51 kg/m  Constitutional: No distress . Vital signs reviewed. HENT: Normocephalic.  Atraumatic.  Eyes: EOMI. No discharge.  Cardiovascular: RRR. No JVD    Respiratory: CTA Bilaterally. Normal effort    GI: BS +, non-distended  Musculoskeletal:Right TTP, stable Lymphedema b/l LE Neurological: She isalertand oriented.  Motor: 5/5 bilateral deltoid bicep tricep grip  3+/5 hip flexion, knee extension, 4+/5 at the ankle dorsiflexors (stable) Skin: Skin iswarmand dry. Intact. Psych: pleasant and cooperative.   Assessment/Plan: 1. Functional deficits secondary to debility which require 3+ hours per day of interdisciplinary therapy in a comprehensive inpatient rehab setting. Physiatrist is providing close team supervision and 24 hour management of active medical problems listed below. Physiatrist and rehab team continue to assess barriers to discharge/monitor  patient progress toward functional and medical goals.  Function:  Bathing Bathing position   Position: Wheelchair/chair at sink  Bathing parts Body parts bathed by patient: Right arm, Left arm, Chest, Abdomen, Front perineal area, Right upper leg, Left upper leg, Buttocks Body parts bathed by helper: Buttocks, Right lower leg, Left lower leg, Back  Bathing assist Assist Level: Set up   Set up : To obtain items  Upper Body Dressing/Undressing Upper body dressing   What is the patient wearing?: Pull over shirt/dress Bra - Perfomed by patient: Thread/unthread right bra strap, Hook/unhook bra (pull down sports bra) Bra - Perfomed by helper: Thread/unthread left bra strap Pull over shirt/dress - Perfomed by patient: Thread/unthread right sleeve, Thread/unthread left sleeve, Put head through opening, Pull shirt over trunk          Upper body assist Assist Level: Set up   Set up : To obtain clothing/put away  Lower Body Dressing/Undressing Lower body dressing   What is the patient wearing?: (donned skirt overhead) Underwear - Performed by patient: Thread/unthread right underwear leg, Thread/unthread left underwear leg, Pull underwear up/down   Pants- Performed by patient: Thread/unthread right pants leg, Thread/unthread left pants leg, Pull pants up/down   Non-skid slipper socks- Performed by patient: Don/doff right sock, Don/doff left sock   Socks - Performed by patient: Don/doff right sock, Don/doff left sock  Lower body assist Assist for lower body dressing: Set up   Set up : To obtain clothing/put away  Toileting Toileting   Toileting steps completed by patient: Adjust clothing prior to toileting, Performs perineal hygiene, Adjust clothing after toileting Toileting steps completed by helper: Performs perineal hygiene Toileting Assistive Devices: Grab bar or rail  Toileting assist Assist level: No help/no cues   Transfers Chair/bed transfer   Chair/bed  transfer method: Ambulatory Chair/bed transfer assist level: Supervision or verbal cues Chair/bed transfer assistive device: Armrests, Patent attorney     Max distance: 25' Assist level: Touching or steadying assistance (Pt > 75%)   Wheelchair   Type: Manual Max wheelchair distance: 62ft Assist Level: Supervision or verbal cues  Cognition Comprehension Comprehension assist level: Follows complex conversation/direction with no assist  Expression Expression assist level: Expresses complex ideas: With no assist  Social Interaction Social Interaction assist level: Interacts appropriately with others - No medications needed.  Problem Solving Problem solving assist level: Solves complex problems: Recognizes & self-corrects  Memory Memory assist level: Complete Independence: No helper    Medical Problem List and Plan: 1.Debilitysecondary to acute on chronic disystolic heart failure with underlying chronic respiratory failure  Cont CIR, plan for d/c tomorrow 2. DVT Prophylaxis/Anticoagulation: Lovenox d/ced on 4/30 due to thrombocytopenia. Monitor for any bleeding episodes 3. Pain Management:Voltaren gel 3 times daily, Mobic 15 mg daily, hydrocodone as needed 4. Mood:Provide emotional support 5. Neuropsych: This patientiscapable of making decisions on herown behalf. 6. Skin/Wound Care:Routine skin checks 7. Fluids/Electrolytes/Nutrition:Routine I/O's   BMP within acceptable range on 4/26 8. Acute on chronic diastolic CHF: Weight daily. Heart healthy diet.  Demadex increased to 40 mg twice a day on 4/26.   Potassium supplement increased on 4/26    Filed Weights   08/19/17 0527 08/20/17 0608 08/21/17 0127  Weight: (!) 154 kg (339 lb 8.1 oz) (!) 153.8 kg (339 lb) (!) 152 kg (335 lb 1.6 oz)    Stable on 4/30 9. Severe pulmonary HTN: with hypoxia:Monitor with increased mobility 10. LLL GYI:RSWNIOEVO course of Omnicef 11. Anemia of chronic disease with  B12 and iron deficiency:    Has been non-complianat with iron/B 12 injections. Monitor with serial checks. Educate on importance of medical compliance.    Continue iron supplement TID, changed B12 injections to po   Hb 9.7 on 4/30   Cont to monitor 12. Severe right knee OA: reviewed potentially etiology of knee pain. Pt acted as if it was completely new information  Continue Mobic, voltaren  Schedule ice 13. 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 Laxative assistance 15. Supplemental oxygen dependent   Was using 1.5L PRN, which started 1 week PTA  Wean as possible, currently on 2 L.  16. Leukopenia  WBCs 3.0 on 4/30  Will discuss with pharmacy regarding potential offending agents  Will require outpt follow up 17. Thrombocytopenia  Plts 98 on 4/30  Lovenox d/ced  Will discuss with pharmacy regarding potential offending agents  LOS (Days) 12 A FACE TO FACE EVALUATION WAS PERFORMED   Karis Juba 08/21/2017 8:34 AM

## 2017-08-21 NOTE — Discharge Summary (Signed)
NAMESHARMAIN, LASTRA NO.:  [id number]  MEDICAL RECORD NO.:  [id number]  LOCATION:                                 FACILITY:  PHYSICIAN:  Maryla Morrow, MD             DATE OF BIRTH:  DATE OF ADMISSION:  08/09/2017 DATE OF DISCHARGE:  08/22/2017                              DISCHARGE SUMMARY   DISCHARGE DIAGNOSES: 1. Debilitation secondary to acute-on-chronic diastolic heart failure     with underlying chronic respiratory failure. 2. Subcutaneous Lovenox deep venous thrombosis prophylaxis. 3. Pain management. 4. Acute-on-chronic diastolic congestive heart failure. 5. Severe pulmonary hypertension, left lower lobe. 6. Community-acquired pneumonia. 7. Anemia of chronic disease. 8. Severe right knee osteoarthritis. 9. Morbid obesity. 10.Constipation. 11.  Thrombocytopenia/leukopenia  HISTORY OF PRESENT ILLNESS:  This is a 52 year old right-handed female with history of obstructive sleep apnea on home oxygen of 1.5 L, CAD, CHF, prior CIR stay for debilitation in the past, gastric bypass, chronic iron-deficiency, and morbid obesity, who presented on August 03, 2017, after a fall onto her right chest and knee.  Workup revealed acute- on-chronic diastolic congestive heart failure.  The patient had reported some increased shortness of breath.  Chest x-ray, community-acquired pneumonia, left lower lobe, anemia of chronic disease, 8.3.  She was treated with intravenous diuresis, placed on Rocephin and Zithromax, received 1 unit of packed red blood cells.  Severe right knee pain.  X- rays revealed severe osteoarthritis.  Orthopedic service is consulted. Knee was injected on August 03, 2017, with Depo-Medrol.  Orthopedic Services recommended weight loss, local measures as well as resumption of Mobic.  Continued to have hypoxia with activity.  Echocardiogram showing ejection fraction 70%.  No wall motion abnormalities.  Left ventricular septum with diastolic-systolic  flattening and increased right ventricular pressures consistent with pulmonary hypertension. Cardiology Service is consulted.  Underwent cardiac catheterization, normal coronary arteries.  The patient remained on Demadex.  CTA of lungs showed no pulmonary emboli.  Subcutaneous Lovenox for DVT prophylaxis.  Physical and occupational therapy ongoing.  The patient was admitted for a comprehensive rehab program.  PAST MEDICAL HISTORY:  See discharge diagnoses.  SOCIAL HISTORY:  Independent prior to admission with an occasional cane.  FUNCTIONAL STATUS:  Upon admission to Rehab Services was moderate assist, 15 feet rolling walker, moderate assist sit to stand, min to mod assist activities of daily living.  PHYSICAL EXAMINATION:  VITAL SIGNS:  Blood pressure 106/50, pulse 81, temperature 98, and respirations 20. GENERAL:  Alert female, in no acute distress, oriented x3. HEENT:  EOMs intact. NECK:  Supple.  Nontender.  No JVD. CARDIAC:  Rate controlled. ABDOMEN:  Soft, nontender.  Good bowel sounds. LUNGS:  Clear to auscultation without wheeze.  Right knee with mild swelling.  REHABILITATION HOSPITAL COURSE:  The patient was admitted to Inpatient Rehab Services.  Therapies initiated on a 3-hour daily basis, consisting of physical therapy, occupational therapy, and rehabilitation nursing. The following issues were addressed during the patient's rehabilitation stay.  Pertaining to Ms. Bergeron's debilitation related to chronic diastolic congestive heart failure, she would follow up outpatient with Heart failure [facility].  He exhibited no other signs of  fluid overload. She remained on Demadex.  She was receiving 1.5 L of oxygen at home prior to admission for history of obstructive sleep apnea.  Oxygen saturations 90% on room air during the day.  Blood pressure is well controlled.  She did have severe right knee osteoarthritis, receiving injection per Orthopedic Services, remaining on Mobic.   She would follow up with Outpatient Orthopedic Services.  Morbid obesity, BMI 77.6. Dietary followup.  Patient with noted thrombocytopenia 107,000 down to 85,000.  Her Lovenox was discontinued.  Noted leukopenia 3.0.  Pharmacy was consulted for any interactions of medications.  Plan follow-up outpatient labs.  Bouts of constipation resolved with laxative assistance.  The patient received weekly collaborative interdisciplinary team conferences to discuss estimated length of stay, family teaching, any barriers to her discharge.  The patient ambulating 50 feet household distances, oxygen saturations maintained 95%.  She still was limited by some knee pain working with energy conservation techniques.  She could gather belongings for activities of daily living and home making, ambulate to the bathroom with an assistive device supervision set up for positioning on toilet, able to complete full hygiene.  Overall, her strength and endurance continued to improve.  She was encouraged to progress.  Family teaching completed and plan discharge to home.  DISCHARGE MEDICATIONS:  Included: 1. Voltaren gel 3 times daily to affected area. 2. Ferrous sulfate 325 mg p.o. t.i.d. 3. Mucinex 600 mg p.o. b.i.d. 4. Mobic 15 mg p.o. daily. 5. Demadex 40 mg p.o. b.i.d. 6. K-Dur 30 mEq p.o. daily. 7. Vitamin B12, 1000 mcg p.o. daily. 8. Hydrocodone 1 to 2 tablets every 6 as needed pain.  DIET:  Her diet was regular.  FOLLOWUP:  She would follow up with Dr. Maryla Morrow, the Outpatient Rehab Service office as directed; Dr. Madlyn Frankel. Charlann Boxer, call for appointment as needed; Dr. Maxine Glenn, [facility] failure [facility] on Sep 03, 2017; Dr. Geanie Logan, Medical Management.  SPECIAL INSTRUCTIONS:  Continue oxygen as prior to admission.  Home health nurse to check CBC Friday, 08/24/2017 results to Dr. Maryla Morrow office [phone] fax number [phone]     Mariam Dollar,  P.A.   ______________________________ Maryla Morrow, MD    DA/MEDQ  D:  08/21/2017  T:  08/21/2017  Job:  [id number]  cc:   Maryla Morrow, MD Madlyn Frankel. Charlann Boxer, M.D.

## 2017-08-21 NOTE — Progress Notes (Signed)
Physical Therapy Discharge Summary  Patient Details  Name: Dana Abbott MRN: [id number] Date of Birth: 1965/06/06  Today's Date: 08/21/2017      Patient has met 6 of 10 long term goals due to improved activity tolerance, improved balance, improved postural control and increased strength.  Patient to discharge at an ambulatory level Modified Independent.   Patient's care partner is independent to provide the necessary physical assistance at discharge.  Reasons goals not met: Pt unable to complete stair training due to pain and weakness. Pt with d/c home no stairs required and was provided with HEP and aquatic HEP to improve LE strengthening. W/C goal not met due to pt d/c at ambulatory status  Recommendation:  Patient will benefit from ongoing skilled PT services in home health setting to continue to advance safe functional mobility, address ongoing impairments in balance, endurance, knee pain, safety, and minimize fall risk.  Equipment: Rolling walker  Reasons for discharge: treatment goals met  Patient/family agrees with progress made and goals achieved: Yes  PT Discharge Precautions/Restrictions Restrictions Weight Bearing Restrictions: No Pain Pain Assessment Pain Scale: 0-10 Pain Score: 4  Vision/Perception    [place] Cognition Orientation Level: Oriented X4 Sensation   Motor  Motor Motor: Within Functional Limits  Mobility Bed Mobility Bed Mobility: Rolling Right;Rolling Left;Supine to Sit;Sit to Supine Rolling Right: 6: Modified independent (Device/Increase time) Rolling Left: 6: Modified independent (Device/Increase time) Supine to Sit: 6: Modified independent (Device/Increase time) Sit to Supine: 6: Modified independent (Device/Increase time) Transfers Transfers: Yes Sit to Stand: 6: Modified independent (Device/Increase time) Stand to Sit: 6: Modified independent (Device/Increase time) Locomotion  Ambulation Ambulation/Gait Assistance: 6: Modified  independent (Device/Increase time) Ambulation Distance (Feet): 48 Feet Assistive device: Rollator Gait Gait: Yes Gait Pattern: Impaired Gait Pattern: Trunk flexed;Wide base of support;Left foot flat;Decreased stride length  Trunk/Postural Assessment  Cervical Assessment Cervical Assessment: Exceptions to WFL(forward head) Thoracic Assessment Thoracic Assessment: Exceptions to WFL(rounded shoulders) Lumbar Assessment Lumbar Assessment: Within Functional Limits Postural Control Postural Control: Within Functional Limits  Balance Balance Balance Assessed: Yes Dynamic Sitting Balance Dynamic Sitting - Level of Assistance: 6: Modified independent (Device/Increase time) Static Standing Balance Static Standing - Balance Support: During functional activity Static Standing - Level of Assistance: 6: Modified independent (Device/Increase time) Dynamic Standing Balance Dynamic Standing - Balance Support: During functional activity Dynamic Standing - Level of Assistance: 6: Modified independent (Device/Increase time) Extremity Assessment      RLE Assessment RLE Assessment: Exceptions to [facility] RLE Strength RLE Overall Strength Comments: grossly 4+/5 LLE Assessment LLE Assessment: Within Functional Limits   See Function Navigator for Current Functional Status.  Rosita DeChalus 08/21/2017, 11:39 AM

## 2017-08-21 NOTE — Progress Notes (Signed)
Occupational Therapy Session Note  Patient Details  Name: Dana Abbott MRN: [id number] Date of Birth: 1965-05-16  Today's Date: 08/21/2017 OT Individual Time: 8366-2947 and 6546-5035 OT Individual Time Calculation (min): 73 min and 45 min  Short Term Goals: Week 2:  OT Short Term Goal 1 (Week 2): STG = LTGs due to remaining LOS  Skilled Therapeutic Interventions/Progress Updates:    1) Completed ADL retraining at overall Mod I level.  Pt received seated upright at EOB reporting feeling better today than yesterday.  Pt utilized Rollator to gather clothing and bathing items prior to washing at sink.  Ambulated to room toilet with Rollator and completed toilet transfer and toileting at Mod I level with Rollator.  Pt completed bathing, dressing, and grooming tasks at Mod I level sit > stand at sink.  Engaged in functional mobility in ADL apt with focus on energy conservation and adaptive strategies to engage in meal prep task.  Pt reports setup of home kitchen and engaged in simulated meal prep at Mod I level with Rollator to gather items and then seated in w/c to simulate seated in chair for cooking at home.  Discussed d/c plan with pt reporting she plans to engage in water aerobics upon d/c to continue to strengthen BLE as well as plans to arrange intermittent supervision/assist from daughter and sister upon d/c.  Pt ambulated 43' with Rollator with improved endurance and increased picking up for LLE during ambulation.  Pt left seated EOB with all needs in reach.  2) Treatment session with focus on shower transfer and therapeutic exercise.  Pt received seated EOB reporting ready for d/c tomorrow.  Discussed home bathroom setup with pt able to complete shower transfer in room shower with tub bench with supervision and increased time.  Provided pt with HEP handout for BUE strengthening to complete with theraband.  Educated on each exercise with pt returning demonstration of each exercise and reporting  understanding.  Pt reports no further questions and ready for d/c.  Therapy Documentation Precautions:  Precautions Precautions: Fall Precaution Comments: monitor O2 sats, desatted on Room Air Restrictions Weight Bearing Restrictions: No Pain: Pain Assessment Pain Scale: 0-10 Pain Score: 0-No pain  See Function Navigator for Current Functional Status.   Therapy/Group: Individual Therapy  Rosalio Loud 08/21/2017, 9:55 AM

## 2017-08-21 NOTE — Plan of Care (Signed)
  Problem: Consults Goal: RH GENERAL PATIENT EDUCATION Description See Patient Education module for education specifics. Outcome: Completed/Met   Problem: RH BLADDER ELIMINATION Goal: RH STG MANAGE BLADDER WITH ASSISTANCE Description STG Manage Bladder With Min Assistance  Outcome: Completed/Met   Problem: RH SKIN INTEGRITY Goal: RH STG SKIN FREE OF INFECTION/BREAKDOWN Description No new breakdown with Min assist.  Outcome: Completed/Met   Problem: RH PAIN MANAGEMENT Goal: RH STG PAIN MANAGED AT OR BELOW PT'S PAIN GOAL Description Less than 3 out of 10.  Outcome: Completed/Met

## 2017-08-21 NOTE — Progress Notes (Signed)
Occupational Therapy Discharge Summary  Patient Details  Name: Dana Abbott MRN: [id number] Date of Birth: [birth year]  Patient has met 9 of 9 long term goals due to improved activity tolerance, improved balance, postural control and ability to compensate for deficits.  Patient to discharge at overall Modified Independent level.  Patient's care partner is independent to provide the necessary intermittent/setup assistance at discharge.  Patient reports daughter plans to stay with her initially upon d/c to provide supervision with shower transfer and assist with transition to home.  Reasons goals not met: N/A  Recommendation:  Patient does not have a qualifying diagnosis for follow up therapy, therefore have given pt HEP which she has been able to return demonstration without cues.  Equipment: No equipment provided. Has all recommended equipment.  Reasons for discharge: treatment goals met and discharge from hospital  Patient/family agrees with progress made and goals achieved: Yes  OT Discharge Precautions/Restrictions  Precautions Precautions: Fall Precaution Comments: monitor O2 sats, desats on room air during functional tasks/mobility Restrictions Weight Bearing Restrictions: No General   Vital Signs Therapy Vitals Temp: 97.6 F (36.4 C) Temp Source: Oral Pulse Rate: 71 Resp: (!) 21 BP: 133/82 Patient Position (if appropriate): Sitting Oxygen Therapy SpO2: 99 % O2 Device: Nasal Cannula O2 Flow Rate (L/min): 2 L/min ADL  See Function Navigator Vision Baseline Vision/History: Wears glasses Wears Glasses: At all times Patient Visual Report: No change from baseline Vision Assessment?: No apparent visual deficits Perception  Perception: Within Functional Limits Praxis Praxis: Intact Cognition Overall Cognitive Status: Within Functional Limits for tasks assessed Orientation Level: Oriented X4 Attention: Selective Selective Attention: Appears intact Memory:  Appears intact Awareness: Appears intact Problem Solving: Appears intact Safety/Judgment: Appears intact Sensation Sensation Light Touch: Appears Intact Proprioception: Appears Intact Coordination Gross Motor Movements are Fluid and Coordinated: Yes Fine Motor Movements are Fluid and Coordinated: Yes Finger Nose Finger Test: [facility] Extremity/Trunk Assessment RUE Assessment RUE Assessment: Exceptions to WFL(generalized weakness) LUE Assessment LUE Assessment: Exceptions to WFL(generalized weakness)   See Function Navigator for Current Functional Status.  Simonne Come 08/21/2017, 4:14 PM

## 2017-08-22 LAB — CBC
HCT: 36.4 % (ref 36.0–46.0)
Hemoglobin: 10.2 g/dL — ABNORMAL LOW (ref 12.0–15.0)
MCH: 25.8 pg — AB (ref 26.0–34.0)
MCHC: 28 g/dL — ABNORMAL LOW (ref 30.0–36.0)
MCV: 92.2 fL (ref 78.0–100.0)
PLATELETS: 85 10*3/uL — AB (ref 150–400)
RBC: 3.95 MIL/uL (ref 3.87–5.11)
RDW: 24.5 % — AB (ref 11.5–15.5)
WBC: 3 10*3/uL — ABNORMAL LOW (ref 4.0–10.5)

## 2017-08-22 LAB — HEPARIN INDUCED PLATELET AB (HIT ANTIBODY): Heparin Induced Plt Ab: 0.455 OD — ABNORMAL HIGH (ref 0.000–0.400)

## 2017-08-22 MED ORDER — POTASSIUM CHLORIDE CRYS ER 15 MEQ PO TBCR: 30.0000 meq | EXTENDED_RELEASE_TABLET | Freq: Every day | ORAL | 1 refills | Status: AC

## 2017-08-22 MED ORDER — TORSEMIDE 20 MG PO TABS: 40.0000 mg | ORAL_TABLET | Freq: Two times a day (BID) | ORAL | 1 refills | Status: AC

## 2017-08-22 MED ORDER — FERROUS SULFATE 325 (65 FE) MG PO TABS: 325.0000 mg | ORAL_TABLET | Freq: Three times a day (TID) | ORAL | 0 refills | Status: AC

## 2017-08-22 MED ORDER — MELOXICAM 15 MG PO TABS: 15.0000 mg | ORAL_TABLET | Freq: Every day | ORAL | 4 refills | Status: AC

## 2017-08-22 MED ORDER — GUAIFENESIN ER 600 MG PO TB12: 600.0000 mg | ORAL_TABLET | Freq: Two times a day (BID) | ORAL | 0 refills | Status: AC

## 2017-08-22 MED ORDER — HYDROCODONE-ACETAMINOPHEN 5-325 MG PO TABS: 1.0000 | ORAL_TABLET | Freq: Four times a day (QID) | ORAL | 0 refills | Status: AC | PRN

## 2017-08-22 MED ORDER — DICLOFENAC SODIUM 1 % TD GEL: 2.0000 g | Freq: Three times a day (TID) | TRANSDERMAL | 3 refills | Status: AC

## 2017-08-22 MED ORDER — VITAMIN B-12 1000 MCG PO TABS: 1000.0000 ug | ORAL_TABLET | Freq: Every day | ORAL | 3 refills | Status: AC

## 2017-08-22 NOTE — Progress Notes (Signed)
Notified Deatra Ina, PA of platelet count as it is still dropping and patient is scheduled to discharge today.  Dani Gobble, RN

## 2017-08-22 NOTE — Discharge Instructions (Signed)
Inpatient Rehab Discharge Instructions  Dana Abbott Discharge date and time: No discharge date for patient encounter.   Activities/Precautions/ Functional Status: Activity: activity as tolerated Diet: Heart healthy diet with 1200 mL fluid restriction Wound Care: none needed Functional status:  ___ No restrictions     ___ Walk up steps independently ___ 24/7 supervision/assistance   ___ Walk up steps with assistance ___ Intermittent supervision/assistance  ___ Bathe/dress independently ___ Walk with walker     _x__ Bathe/dress with assistance ___ Walk Independently    ___ Shower independently ___ Walk with assistance    ___ Shower with assistance ___ No alcohol     ___ Return to work/school ________  COMMUNITY REFERRALS UPON DISCHARGE:   Home Health:   RN - [facility]       Phone:  [phone]  *Call them if you move to a different address from your address.*            Continue the exercises the therapists gave you. Medical Equipment/Items Ordered:  Bariatric rolling walker  Agency/Supplier:  [facility]       Phone:  [phone]  Special Instructions: Continue oxygen as prior to admission  Home health nurse to check CBC 08/24/2017 results to Dr. Maryla Morrow [phone] fax number [phone]   My questions have been answered and I understand these instructions. I will adhere to these goals and the provided educational materials after my discharge from the hospital.  Patient/Caregiver Signature _______________________________ Date __________  Clinician Signature _______________________________________ Date __________  Please bring this form and your medication list with you to all your follow-up doctor's appointments.

## 2017-08-22 NOTE — Progress Notes (Signed)
Social Work Discharge Note  The overall goal for the admission was met for:   Discharge location: Yes - her home, but may go to her sister's home if she needs more help  Length of Stay: Yes - 12 days  Discharge activity level: Yes - modified independent with supervision for showering  Home/community participation: Yes  Services provided included: MD, RD, PT, OT, RN, Pharmacy, Neuropsych and SW  Financial Services: Medicaid  Follow-up services arranged: Home Health: RN, DME: bariatric rolling walker and Patient/Family request agency HH: [facility], DME: [facility]  Comments (or additional information):  Pt to return to her home with her dtr to help her.  If dtr can't, then pt will go to her sister's home.  CSW encouraged her to call [facility] if she changes addresses so they know where she is.    Patient/Family verbalized understanding of follow-up arrangements: Yes  Individual responsible for coordination of the follow-up plan: pt  Confirmed correct DME delivered: Trey Sailors 08/22/2017    Joycelynn Fritsche, Silvestre Mesi

## 2017-08-22 NOTE — Progress Notes (Signed)
[facility]     PROGRESS NOTE  Subjective/Complaints:  Patient seen lying in bed this morning. She states she slept well overnight. She states she is ready for discharge today.  ROS: denies CP, SOB, N/V/D  Objective: Vital Signs: Blood pressure 106/62, pulse 75, temperature 97.9 F (36.6 C), temperature source Oral, resp. rate 18, height 4\' 9"  (1.448 m), weight (!) 150 kg (330 lb 11 oz), SpO2 95 %. No results found. Recent Labs    08/21/17 1249 08/22/17 0655  WBC 4.2 3.0*  HGB 10.5* 10.2*  HCT 37.4 36.4  PLT 107* PENDING   Recent Labs    08/21/17 0723  NA 138  K 4.0  CL 101  GLUCOSE 85  BUN 5*  CREATININE 0.35*  CALCIUM 8.3*   CBG (last 3)  No results for input(s): GLUCAP in the last 72 hours.  Wt Readings from Last 3 Encounters:  08/22/17 (!) 150 kg (330 lb 11 oz)  08/09/17 (!) 159 kg (350 lb 8.5 oz)  11/02/15 (!) 167.4 kg (369 lb)    Physical Exam:  BP 106/62 (BP Location: Left Arm)   Pulse 75   Temp 97.9 F (36.6 C) (Oral)   Resp 18   Ht 4\' 9"  (1.448 m)   Wt (!) 150 kg (330 lb 11 oz)   SpO2 95%   BMI 71.56 kg/m  Constitutional: No distress . Vital signs reviewed. HENT: Normocephalic.  Atraumatic.  Eyes: EOMI. No discharge.  Cardiovascular: RRR. No JVD    Respiratory: CTA Bilaterally. Normal effort    GI: BS +, non-distended  Musculoskeletal:Right knee TTP, stable Lymphedema b/l LE Neurological: She isalertand oriented.  Motor: 5/5 bilateral deltoid bicep tricep grip  3+/5 hip flexion, knee extension, 4+/5 at the ankle dorsiflexors (unchanged) Skin: Skin iswarmand dry. Intact. Psych: pleasant and cooperative.   Assessment/Plan: 1. Functional deficits secondary to debility which require 3+ hours per day of interdisciplinary therapy in a comprehensive inpatient rehab setting. Physiatrist is providing close team supervision and 24 hour management of active medical problems listed below. Physiatrist and rehab  team continue to assess barriers to discharge/monitor patient progress toward functional and medical goals.  Function:  Bathing Bathing position   Position: Wheelchair/chair at sink  Bathing parts Body parts bathed by patient: Right arm, Left arm, Chest, Abdomen, Front perineal area, Right upper leg, Left upper leg, Buttocks Body parts bathed by helper: Buttocks, Right lower leg, Left lower leg, Back  Bathing assist Assist Level: More than reasonable time   Set up : To obtain items  Upper Body Dressing/Undressing Upper body dressing   What is the patient wearing?: Pull over shirt/dress Bra - Perfomed by patient: Thread/unthread right bra strap, Hook/unhook bra (pull down sports bra) Bra - Perfomed by helper: Thread/unthread left bra strap Pull over shirt/dress - Perfomed by patient: Thread/unthread right sleeve, Thread/unthread left sleeve, Put head through opening, Pull shirt over trunk          Upper body assist Assist Level: More than reasonable time   Set up : To obtain clothing/put away  Lower Body Dressing/Undressing Lower body dressing   What is the patient wearing?: Non-skid slipper socks(donned skirt overhead like shirt Mod I) Underwear - Performed by patient: Thread/unthread right underwear leg, Thread/unthread left underwear leg, Pull underwear up/down   Pants- Performed by patient: Thread/unthread right pants leg, Thread/unthread left pants leg, Pull pants up/down   Non-skid slipper socks- Performed by patient: Don/doff right sock, Don/doff left sock  Socks - Performed by patient: Don/doff right sock, Don/doff left sock                Lower body assist Assist for lower body dressing: More than reasonable time   Set up : To obtain clothing/put away  Toileting Toileting   Toileting steps completed by patient: Adjust clothing prior to toileting, Performs perineal hygiene, Adjust clothing after toileting Toileting steps completed by helper: Performs perineal  hygiene Toileting Assistive Devices: Grab bar or rail  Toileting assist Assist level: No help/no cues   Transfers Chair/bed transfer   Chair/bed transfer method: Ambulatory Chair/bed transfer assist level: No Help, no cues, assistive device, takes more than a reasonable amount of time Chair/bed transfer assistive device: Armrests, Patent attorney     Max distance: 23ft Assist level: No help, No cues, assistive device, takes more than a reasonable amount of time   Wheelchair   Type: Manual Max wheelchair distance: 69ft Assist Level: Supervision or verbal cues  Cognition Comprehension Comprehension assist level: Follows complex conversation/direction with no assist  Expression Expression assist level: Expresses complex ideas: With no assist  Social Interaction Social Interaction assist level: Interacts appropriately with others - No medications needed.  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 DVT Prophylaxis/Anticoagulation: Lovenox d/ced on 4/30 due to thrombocytopenia. Monitor for any bleeding episodes 3. Pain Management:Voltaren gel 3 times daily, Mobic 15 mg daily, hydrocodone as needed 4. Mood:Provide emotional support 5. Neuropsych: This patientiscapable of making decisions on herown behalf. 6. Skin/Wound Care:Routine skin checks 7. Fluids/Electrolytes/Nutrition:Routine I/O's   BMP within acceptable range on 4/26 8. Acute on chronic diastolic CHF: Weight daily. Heart healthy diet.  Demadex increased to 40 mg twice a day on 4/26.   Potassium supplement increased on 4/26    Filed Weights   08/20/17 0608  08/21/17 0127 08/22/17 0536  Weight: (!) 153.8 kg (339 lb) (!) 152 kg (335 lb 1.6 oz) (!) 150 kg (330 lb 11 oz)    Stable on 5/1 9. Severe pulmonary HTN: with hypoxia:Monitor with increased mobility 10. LLL ZOX:[id number] course of Omnicef 11. Anemia of chronic disease with B12 and iron deficiency:    Has been non-complianat with iron/B 12 injections. Monitor with serial checks. Educate on importance of medical compliance.    Continue iron supplement TID, changed B12 injections to po   Hb 10.2 on 5/1   Cont to monitor 12. Severe right knee OA: reviewed potentially etiology of knee pain. Pt acted as if it was completely new information  Continue Mobic, voltaren  Schedule ice 13. 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 Laxative assistance 15. Supplemental oxygen dependent   Was using 1.5L PRN, which started 1 week PTA  Wean as possible, currently on 2 L.  16. Leukopenia  WBCs 3.0 on 5/1  Appears to have intermittent leukopenia, recommend outpatient hematology follow-up 17. Thrombocytopenia  Plts 107 on 4/30, improving  Lovenox d/ced  Will need outpatient follow-up labs  LOS (Days) 13 A FACE TO FACE EVALUATION WAS PERFORMED  Byrd Terrero Karis Juba 08/22/2017 8:36 AM

## 2017-08-22 NOTE — Progress Notes (Signed)
Patient discharged home.  Left floor via wheelchair, escorted by nursing staff and family.  Patient and family verbalize understanding of discharge instructions as given by Deatra Ina, PA.  All patient belongings sent with patient, including DME, and prescriptions.  Patient appears to be in no immediate distress at this time.  Dani Gobble, RN

## 2017-08-24 ENCOUNTER — Telehealth: Payer: Self-pay

## 2017-08-24 NOTE — Telephone Encounter (Signed)
Transitional Care call  Patient name: Dana Abbott) DOB: (01-09-1966) 1. Are you/is patient experiencing any problems since coming home? (NO) a. Are there any questions regarding any aspect of care? (NO) 2. Are there any questions regarding medications administration/dosing? (NO) a. Are meds being taken as prescribed? (YES) b. "Patient should review meds with caller to confirm"  3. Have there been any falls? (YES) 4. Has Home Health been to the house and/or have they contacted you? (NA) a. If not, have you tried to contact them? (NA) b. Can we help you contact them? (NA) 5. Are bowels and bladder emptying properly? (YES) a. Are there any unexpected incontinence issues? (NO) b. If applicable, is patient following bowel/bladder programs? (NA) 6. Any fevers, problems with breathing, unexpected pain? (NO) 7. Are there any skin problems or new areas of breakdown? (NO) 8. Has the patient/family member arranged specialty MD follow up (ie cardiology/neurology/renal/surgical/etc.)?  (YES) a. Can we help arrange? (NO) 9. Does the patient need any other services or support that we can help arrange? (NO) 10. Are caregivers following through as expected in assisting the patient? (YES) 11. Has the patient quit smoking, drinking alcohol, or using drugs as recommended? (NA)  Appointment date/time (08-30-2017 / 1240PM), arrive time (100PM) and who it is with here (DR. PATEL) [id number] [phone]

## 2017-08-29 ENCOUNTER — Telehealth: Payer: Self-pay | Admitting: *Deleted

## 2017-08-29 NOTE — Telephone Encounter (Signed)
Kandee Keen, RN, [facility] left a message asking for verbal orders for [facility] 1week4.  I reviewed social work's note.  Contacted HHRN and gave verbal orders per office protocol

## 2017-08-30 ENCOUNTER — Encounter: Payer: Medicaid Other | Attending: Physical Medicine & Rehabilitation | Admitting: Physical Medicine & Rehabilitation

## 2017-09-03 ENCOUNTER — Encounter (HOSPITAL_COMMUNITY): Payer: Medicaid Other

## 2017-09-04 ENCOUNTER — Ambulatory Visit: Payer: Self-pay | Admitting: Internal Medicine

## 2017-09-19 DIAGNOSIS — Z9981 Dependence on supplemental oxygen: Secondary | ICD-10-CM | POA: Diagnosis not present

## 2017-09-19 DIAGNOSIS — G4733 Obstructive sleep apnea (adult) (pediatric): Secondary | ICD-10-CM | POA: Diagnosis not present

## 2017-09-19 DIAGNOSIS — D509 Iron deficiency anemia, unspecified: Secondary | ICD-10-CM | POA: Diagnosis not present

## 2017-09-19 DIAGNOSIS — Z8673 Personal history of transient ischemic attack (TIA), and cerebral infarction without residual deficits: Secondary | ICD-10-CM

## 2017-09-19 DIAGNOSIS — I252 Old myocardial infarction: Secondary | ICD-10-CM | POA: Diagnosis not present

## 2017-09-19 DIAGNOSIS — Z9181 History of falling: Secondary | ICD-10-CM

## 2017-09-19 DIAGNOSIS — I739 Peripheral vascular disease, unspecified: Secondary | ICD-10-CM | POA: Diagnosis not present

## 2017-09-19 DIAGNOSIS — I272 Pulmonary hypertension, unspecified: Secondary | ICD-10-CM | POA: Diagnosis not present

## 2017-09-19 DIAGNOSIS — Z87891 Personal history of nicotine dependence: Secondary | ICD-10-CM

## 2017-09-19 DIAGNOSIS — J9610 Chronic respiratory failure, unspecified whether with hypoxia or hypercapnia: Secondary | ICD-10-CM | POA: Diagnosis not present

## 2017-09-19 DIAGNOSIS — D510 Vitamin B12 deficiency anemia due to intrinsic factor deficiency: Secondary | ICD-10-CM | POA: Diagnosis not present

## 2017-09-19 DIAGNOSIS — I5032 Chronic diastolic (congestive) heart failure: Secondary | ICD-10-CM | POA: Diagnosis not present

## 2017-09-19 DIAGNOSIS — M1711 Unilateral primary osteoarthritis, right knee: Secondary | ICD-10-CM | POA: Diagnosis not present

## 2017-09-19 DIAGNOSIS — I2510 Atherosclerotic heart disease of native coronary artery without angina pectoris: Secondary | ICD-10-CM | POA: Diagnosis not present

## 2017-09-19 DIAGNOSIS — Z8701 Personal history of pneumonia (recurrent): Secondary | ICD-10-CM | POA: Diagnosis not present

## 2019-05-25 IMAGING — CR DG CHEST 2V
2 series · 2 of 2 positions shown · non-contrast
Comparison: Chest x-ray and CT chest dated June 02, 2016.

CLINICAL DATA: Chest pain.

EXAM:
CHEST - 2 VIEW

[chest lat]
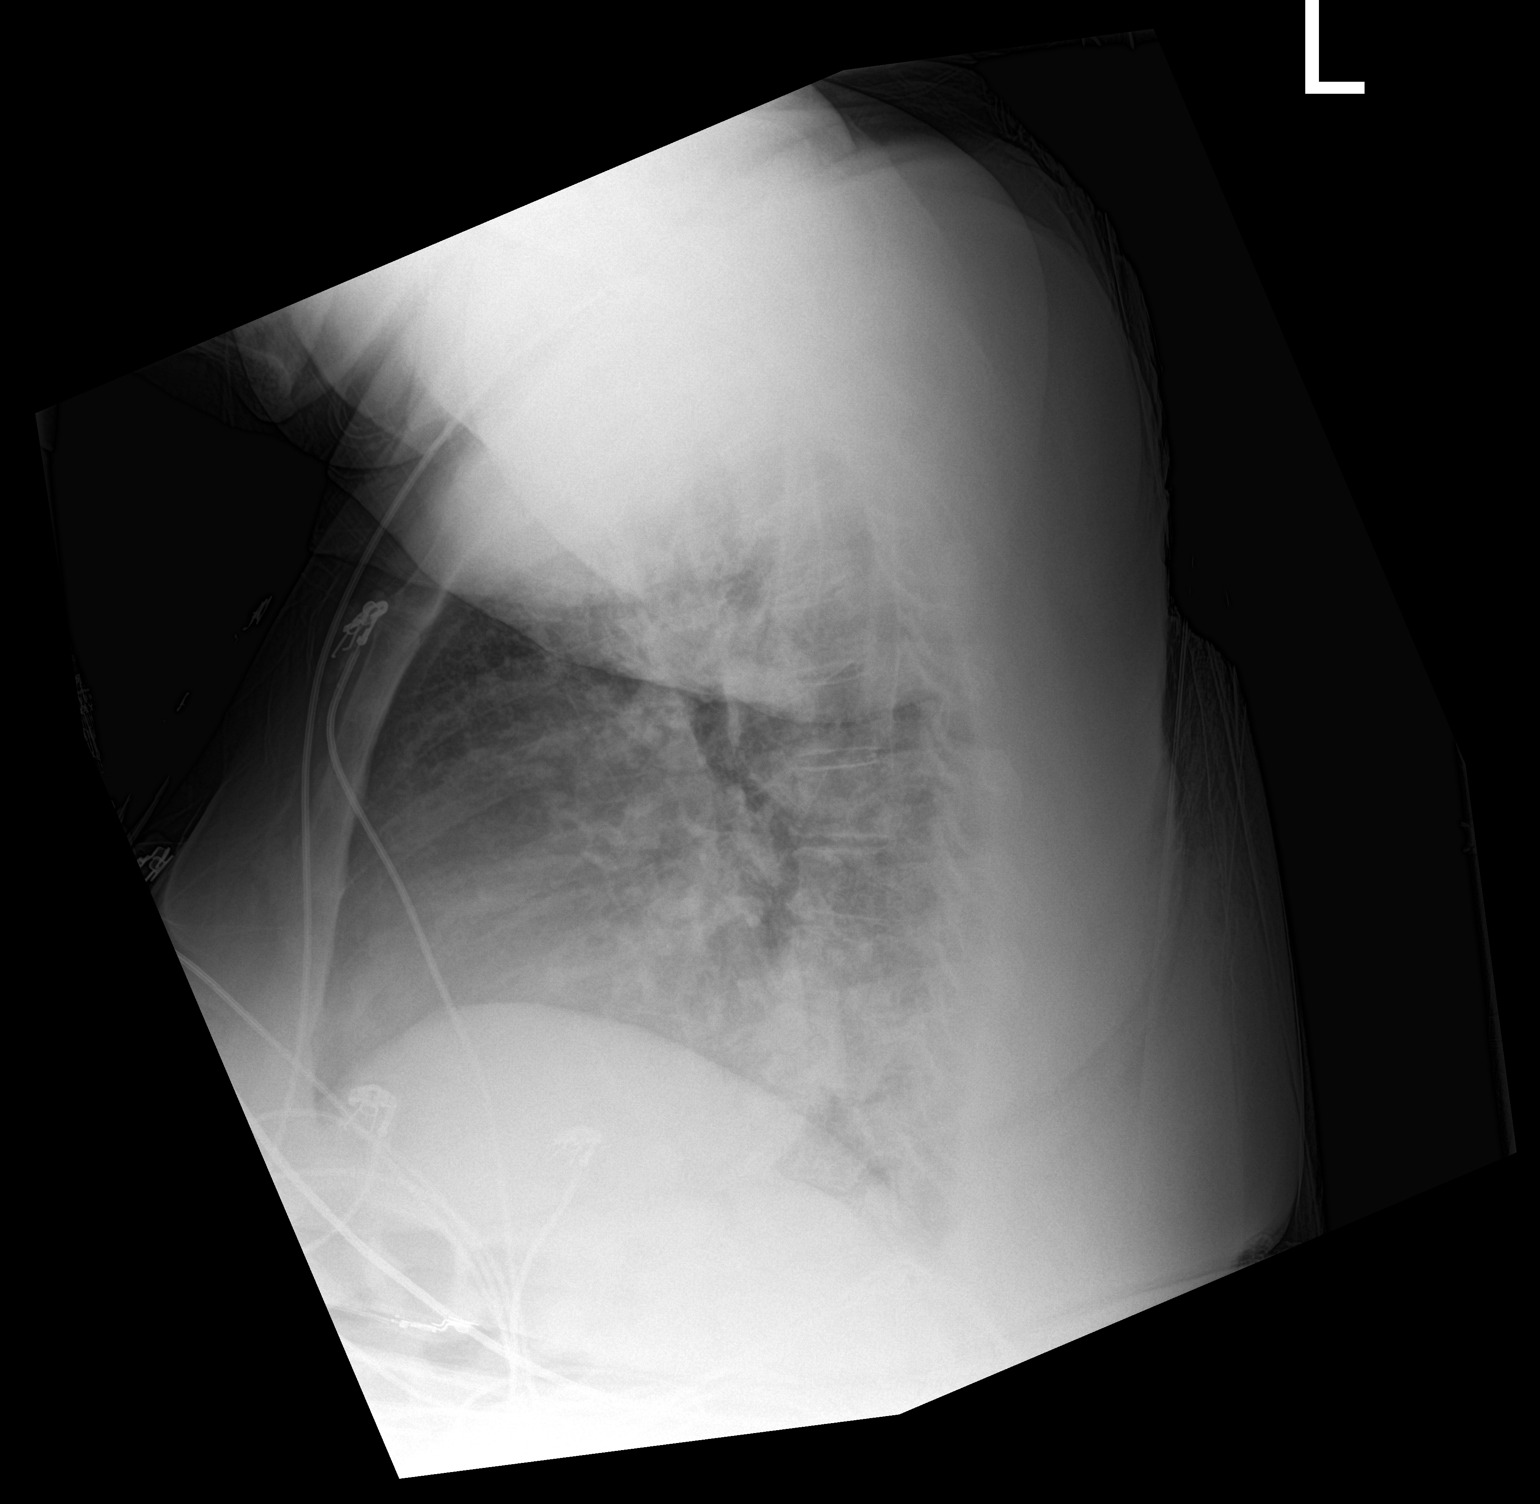

[chest ap]
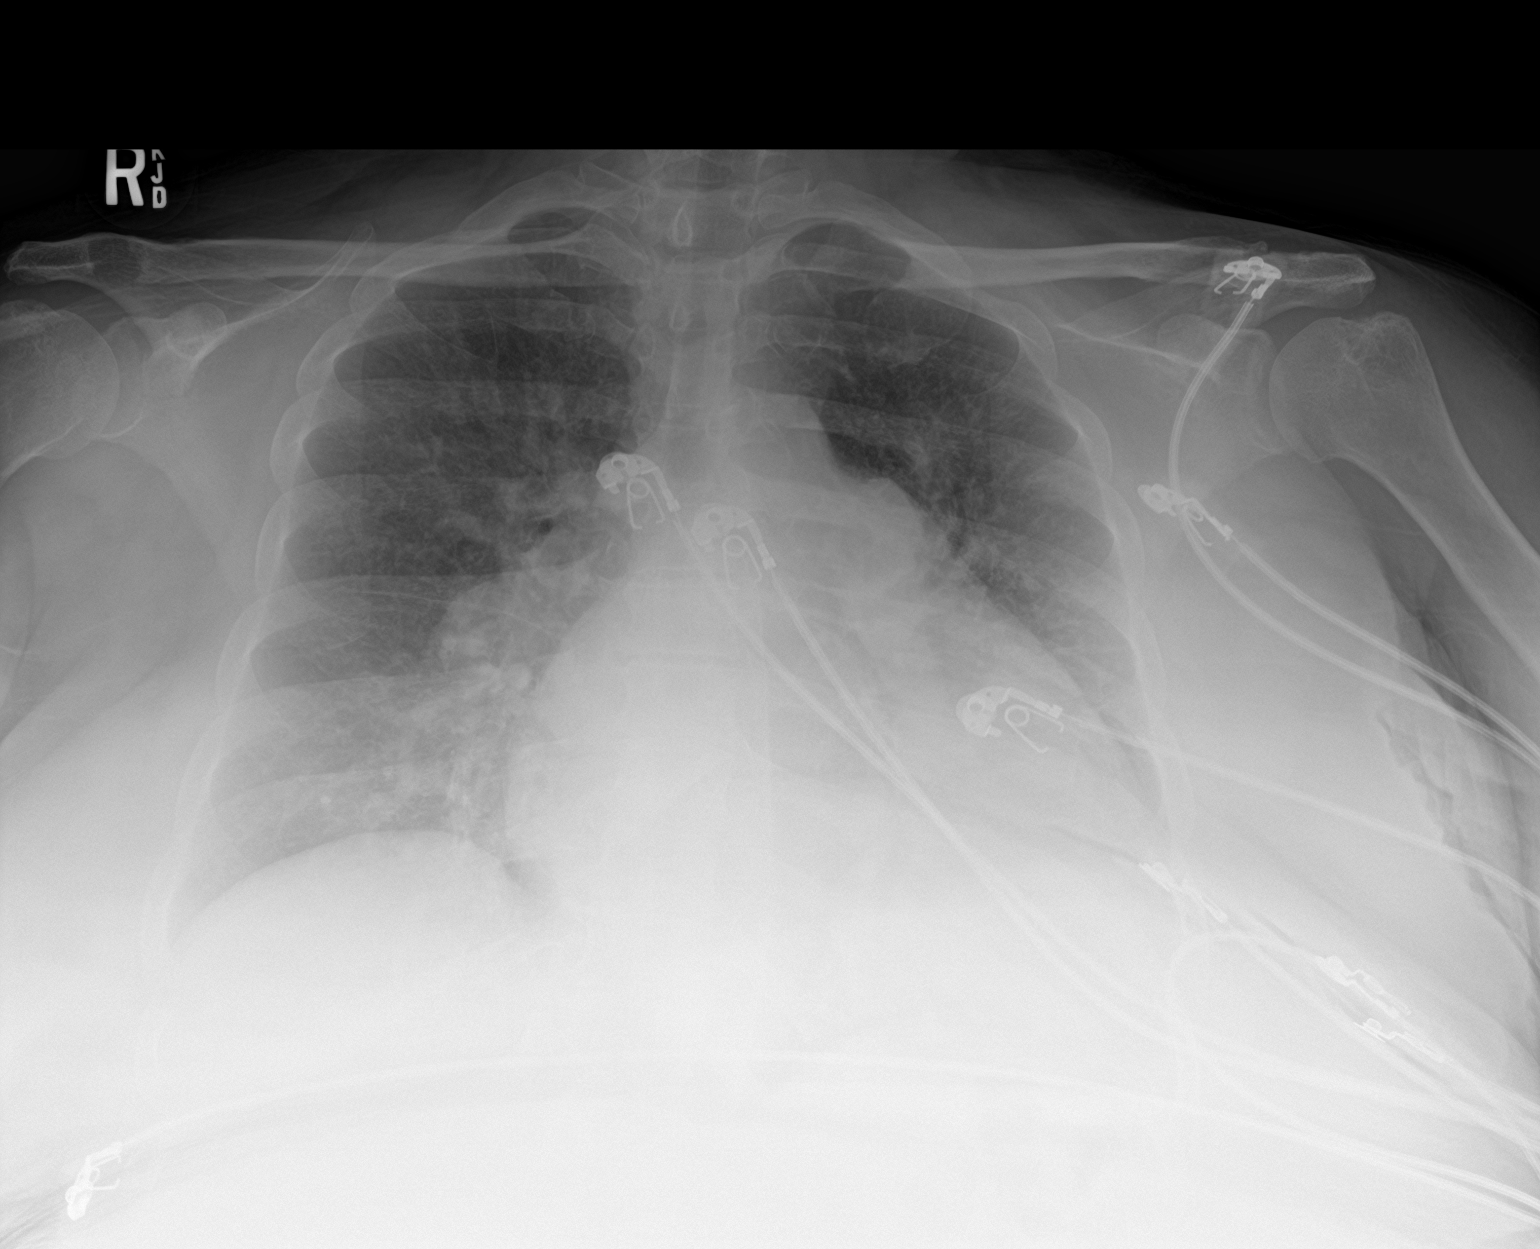

[2 of 2 positions shown; findings below may reference images not displayed]

FINDINGS: Stable cardiomegaly. Worsening perihilar interstitial opacities. New
patchy opacities in the left lower lobe. No pleural effusion or
pneumothorax. No acute osseous abnormality.
IMPRESSION: 1. Stable cardiomegaly with worsening perihilar edema.
2. New patchy opacities in the left lower lobe, atelectasis versus
infiltrate.

## 2019-05-27 IMAGING — CR DG KNEE 3 VIEWS*R*
4 series · 4 of 4 positions shown · non-contrast
Comparison: 02/09/2016

CLINICAL DATA: Recent fall with right knee pain. Pain is along the
anterior surface.

EXAM:
RIGHT KNEE - 3 VIEW

[knee ap]
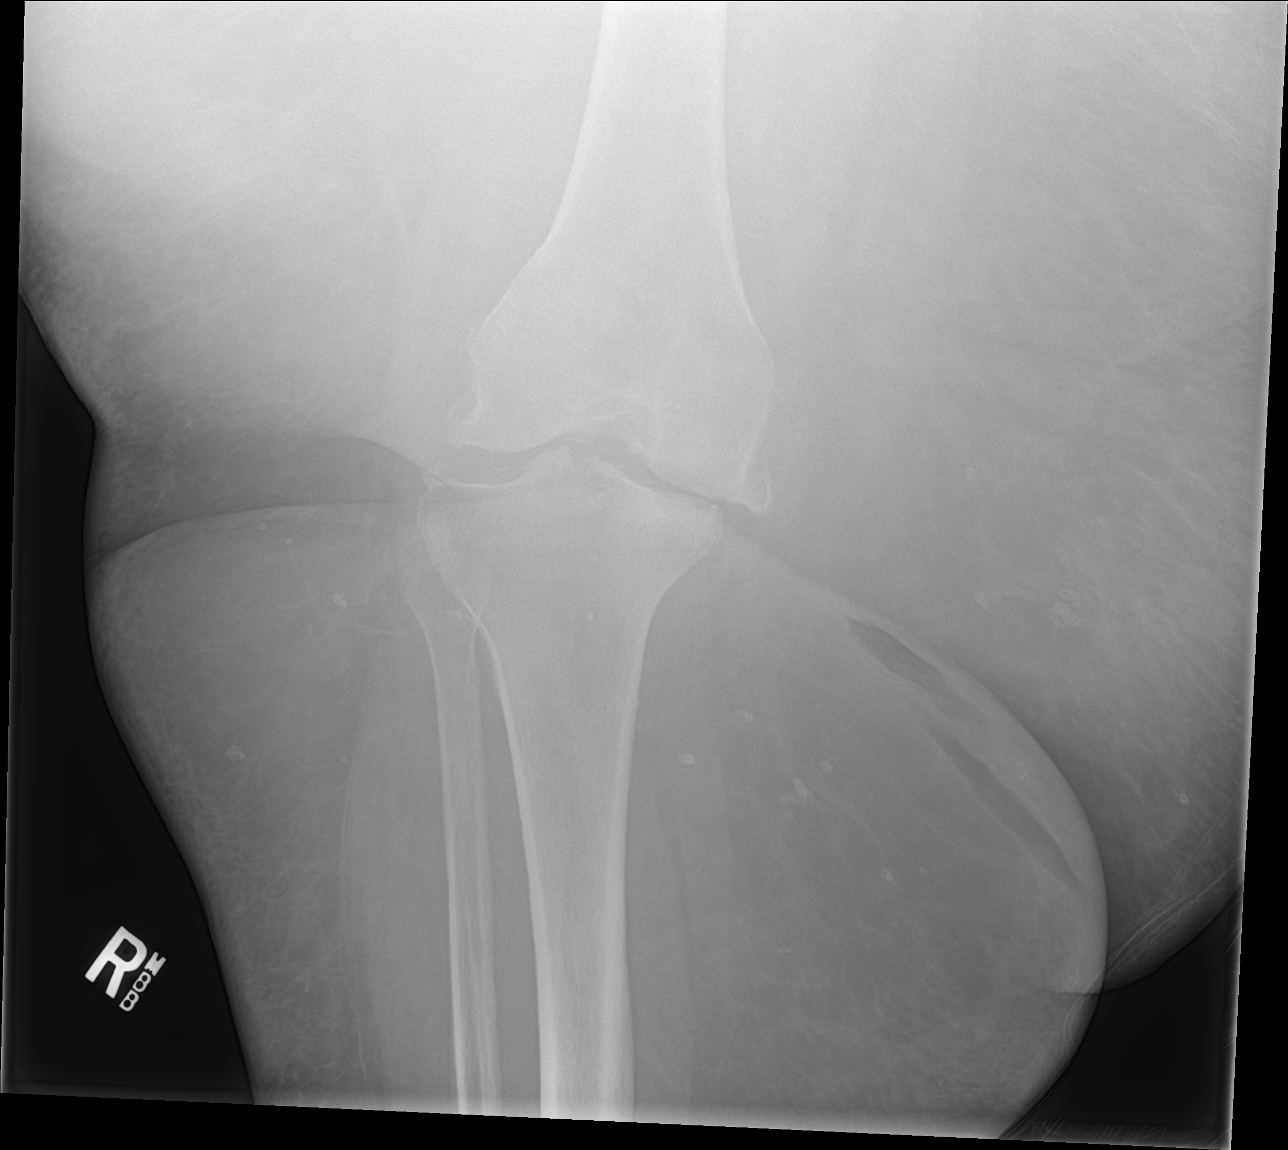

[knee lat]
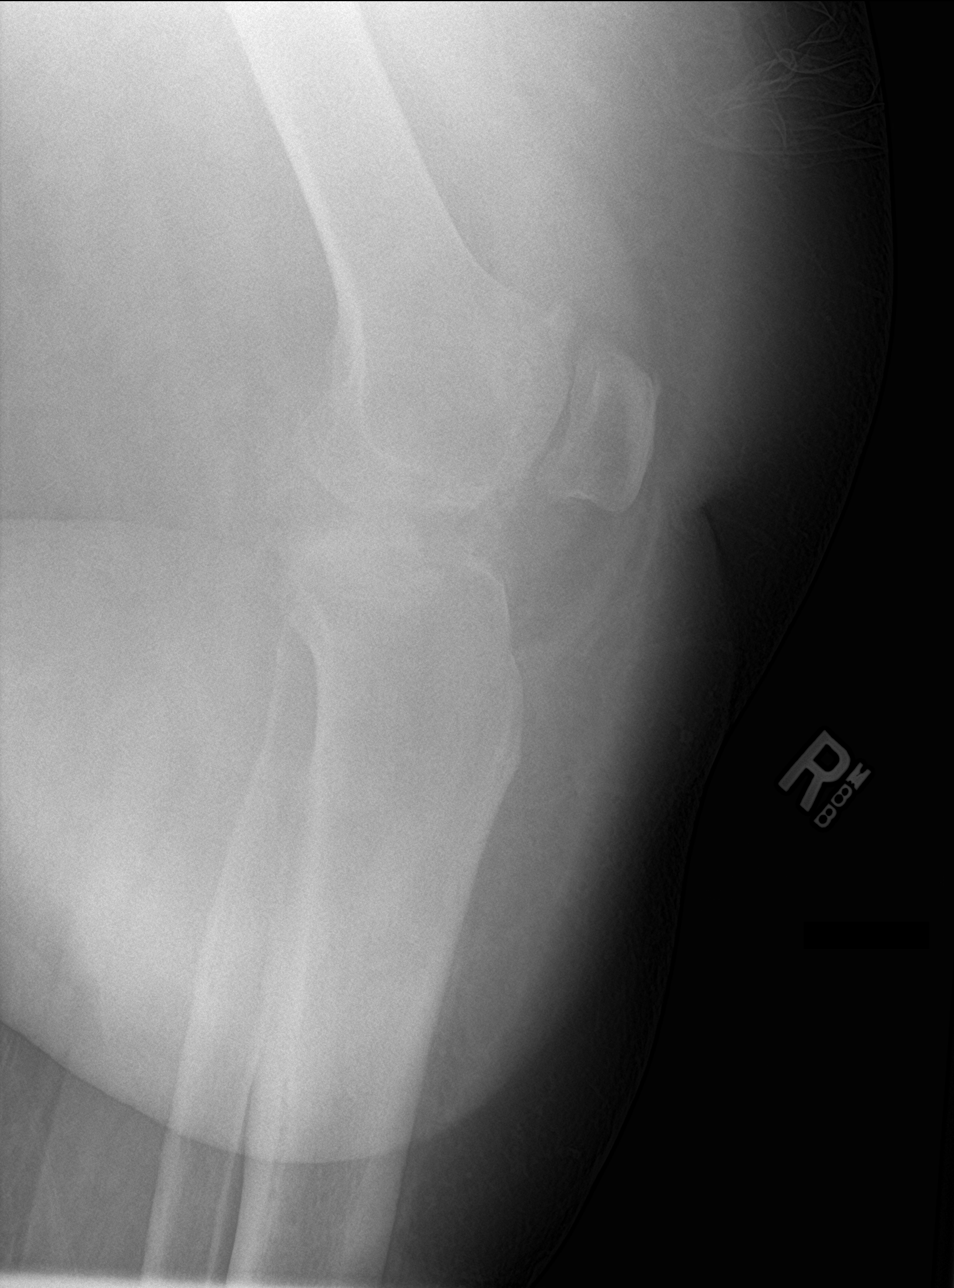

[knee obl (1 of 2)]
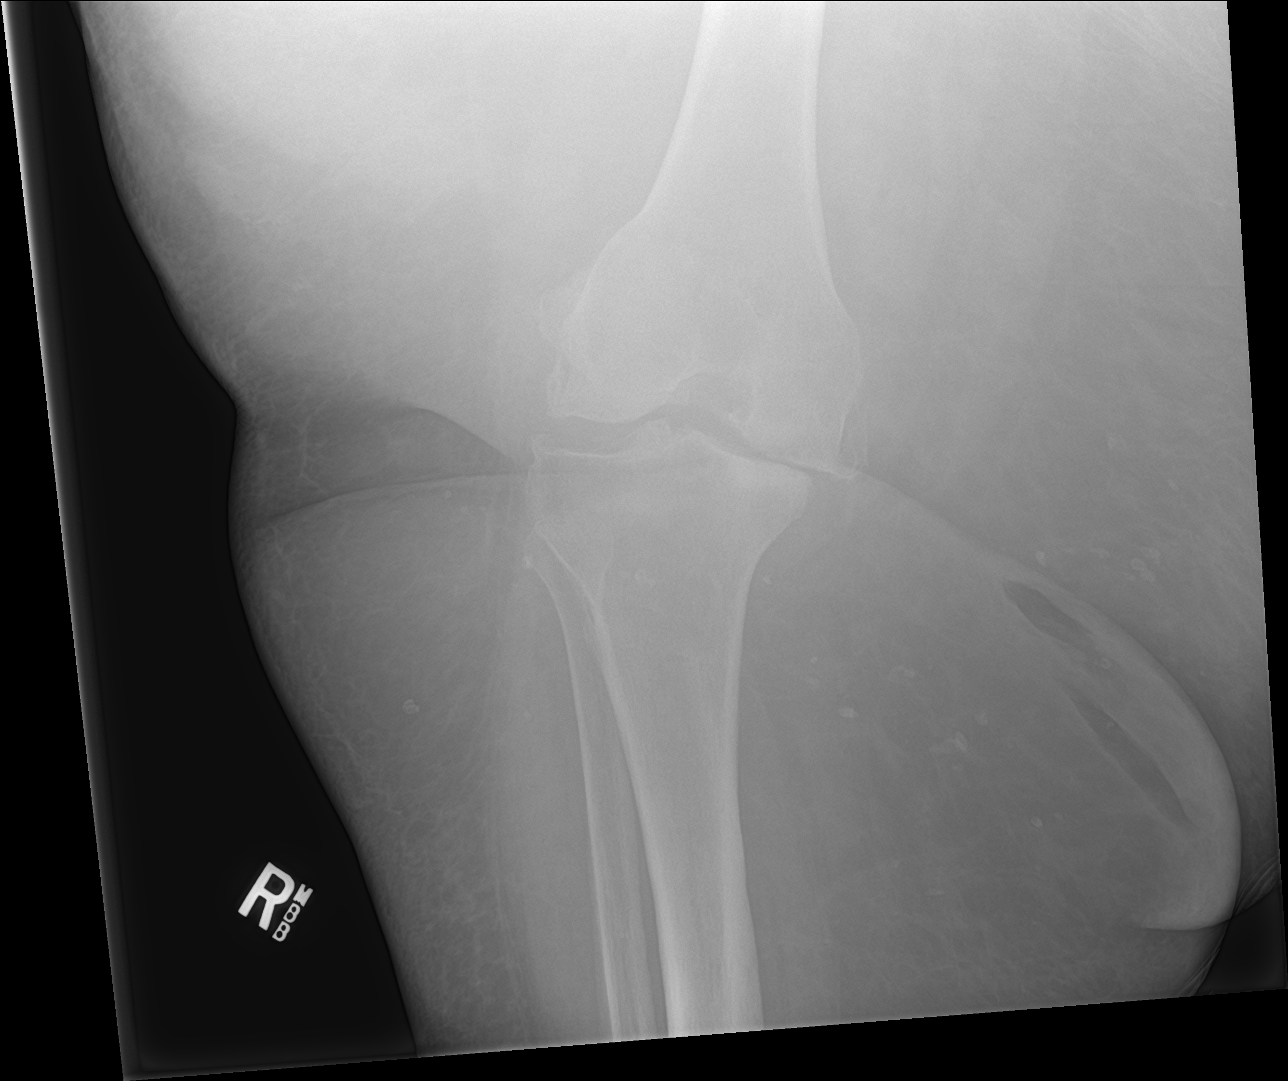

[knee obl (2 of 2)]
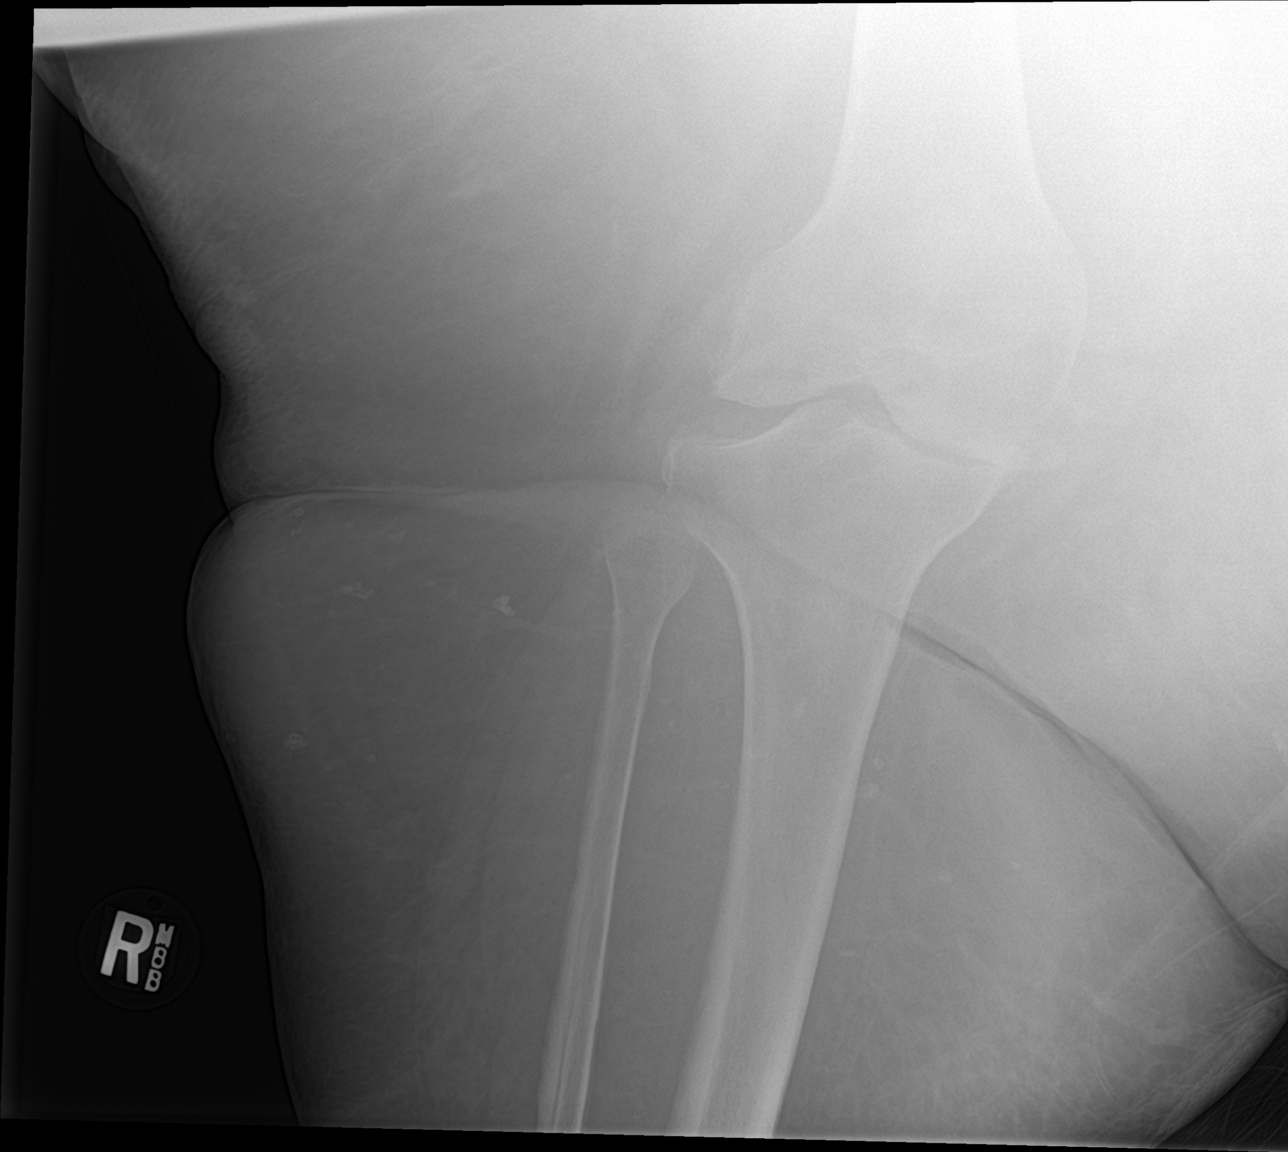

[4 of 4 positions shown; findings below may reference images not displayed]

FINDINGS: Again noted are severe degenerative changes involving the right
knee. Severe medial compartment narrowing with flattening and
remodeling of the medial femoral condyle. Mild lateral subluxation
of the right knee associated with the degenerative disease. Diffuse
osteophytosis in the knee. Negative for an acute fracture or
dislocation. Probable suprapatellar joint effusion. Bone detail is
limited due to body habitus.
IMPRESSION: Severe osteoarthritis in the right knee, particularly in the medial
knee compartment. Probable joint effusion.

No acute bone abnormality.

## 2021-01-22 DEATH — deceased
